# Patient Record
Sex: Female | Born: 1980 | Race: Black or African American | Hispanic: No | Marital: Married | Smoking: Never smoker
Health system: Southern US, Community
[De-identification: ages and names within clinical notes are randomized; demographics above are authoritative.]

## PROBLEM LIST (undated history)

## (undated) DIAGNOSIS — F329 Major depressive disorder, single episode, unspecified: Secondary | ICD-10-CM

## (undated) DIAGNOSIS — E7800 Pure hypercholesterolemia, unspecified: Secondary | ICD-10-CM

## (undated) DIAGNOSIS — D649 Anemia, unspecified: Secondary | ICD-10-CM

## (undated) DIAGNOSIS — D582 Other hemoglobinopathies: Secondary | ICD-10-CM

## (undated) DIAGNOSIS — G5600 Carpal tunnel syndrome, unspecified upper limb: Secondary | ICD-10-CM

## (undated) DIAGNOSIS — E119 Type 2 diabetes mellitus without complications: Secondary | ICD-10-CM

## (undated) DIAGNOSIS — F32A Depression, unspecified: Secondary | ICD-10-CM

## (undated) DIAGNOSIS — E1140 Type 2 diabetes mellitus with diabetic neuropathy, unspecified: Secondary | ICD-10-CM

## (undated) HISTORY — PX: EYE SURGERY: SHX253

## (undated) HISTORY — PX: MOUTH SURGERY: SHX715

## (undated) HISTORY — PX: TUBAL LIGATION: SHX77

## (undated) HISTORY — PX: TOE AMPUTATION: SHX809

---

## 2011-03-07 ENCOUNTER — Emergency Department (HOSPITAL_COMMUNITY): Payer: Self-pay

## 2011-03-07 ENCOUNTER — Emergency Department (HOSPITAL_COMMUNITY)
Admission: EM | Admit: 2011-03-07 | Discharge: 2011-03-08 | Disposition: A | Discharge status: Home / Self Care | Payer: Self-pay | Attending: Emergency Medicine | Admitting: Emergency Medicine

## 2011-03-07 DIAGNOSIS — Y9354 Activity, bowling: Secondary | ICD-10-CM | POA: Insufficient documentation

## 2011-03-07 DIAGNOSIS — R109 Unspecified abdominal pain: Secondary | ICD-10-CM | POA: Insufficient documentation

## 2011-03-07 DIAGNOSIS — R1013 Epigastric pain: Secondary | ICD-10-CM | POA: Insufficient documentation

## 2011-03-07 DIAGNOSIS — E119 Type 2 diabetes mellitus without complications: Secondary | ICD-10-CM | POA: Insufficient documentation

## 2011-03-07 DIAGNOSIS — Z794 Long term (current) use of insulin: Secondary | ICD-10-CM | POA: Insufficient documentation

## 2011-03-07 DIAGNOSIS — X58XXXA Exposure to other specified factors, initial encounter: Secondary | ICD-10-CM | POA: Insufficient documentation

## 2011-03-07 DIAGNOSIS — R0989 Other specified symptoms and signs involving the circulatory and respiratory systems: Secondary | ICD-10-CM | POA: Insufficient documentation

## 2011-03-07 DIAGNOSIS — R319 Hematuria, unspecified: Secondary | ICD-10-CM | POA: Insufficient documentation

## 2011-03-07 DIAGNOSIS — S335XXA Sprain of ligaments of lumbar spine, initial encounter: Secondary | ICD-10-CM | POA: Insufficient documentation

## 2011-03-07 DIAGNOSIS — R0609 Other forms of dyspnea: Secondary | ICD-10-CM | POA: Insufficient documentation

## 2011-03-07 LAB — DIFFERENTIAL
Eosinophils Relative: 4 % (ref 0–5)
Lymphocytes Relative: 40 % (ref 12–46)
Lymphs Abs: 1.8 10*3/uL (ref 0.7–4.0)

## 2011-03-07 LAB — COMPREHENSIVE METABOLIC PANEL
Albumin: 3.6 g/dL (ref 3.5–5.2)
BUN: 13 mg/dL (ref 6–23)
CO2: 23 mEq/L (ref 19–32)
Chloride: 96 mEq/L (ref 96–112)
Creatinine, Ser: 0.73 mg/dL (ref 0.50–1.10)
GFR calc Af Amer: >60 mL/min (ref >60)
GFR calc non Af Amer: >60 mL/min (ref >60)
Glucose, Bld: 344 mg/dL — ABNORMAL HIGH (ref 70–99)
Total Bilirubin: 0.4 mg/dL (ref 0.3–1.2)

## 2011-03-07 LAB — CBC
HCT: 47.8 % — ABNORMAL HIGH (ref 36.0–46.0)
MCV: 78.1 fL (ref 78.0–100.0)
RDW: 12.8 % (ref 11.5–15.5)
WBC: 4.3 10*3/uL (ref 4.0–10.5)

## 2011-03-07 LAB — GLUCOSE, CAPILLARY: Glucose-Capillary: 439 mg/dL — ABNORMAL HIGH (ref 70–99)

## 2011-03-07 LAB — LIPASE, BLOOD: Lipase: 54 U/L (ref 11–59)

## 2011-03-08 LAB — URINALYSIS, ROUTINE W REFLEX MICROSCOPIC
Bilirubin Urine: NEGATIVE
Ketones, ur: NEGATIVE mg/dL
Nitrite: NEGATIVE
Protein, ur: NEGATIVE mg/dL
Urobilinogen, UA: 1 mg/dL (ref 0.0–1.0)

## 2011-03-08 LAB — D-DIMER, QUANTITATIVE: D-Dimer, Quant: <0.22 ug/mL-FEU (ref 0.00–0.48)

## 2011-03-09 ENCOUNTER — Emergency Department (HOSPITAL_COMMUNITY)
Admission: EM | Admit: 2011-03-09 | Discharge: 2011-03-10 | Disposition: A | Discharge status: Home / Self Care | Payer: Self-pay | Attending: Emergency Medicine | Admitting: Emergency Medicine

## 2011-03-09 DIAGNOSIS — E119 Type 2 diabetes mellitus without complications: Secondary | ICD-10-CM | POA: Insufficient documentation

## 2011-03-09 DIAGNOSIS — R1013 Epigastric pain: Secondary | ICD-10-CM | POA: Insufficient documentation

## 2011-03-09 DIAGNOSIS — R110 Nausea: Secondary | ICD-10-CM | POA: Insufficient documentation

## 2011-03-09 DIAGNOSIS — R209 Unspecified disturbances of skin sensation: Secondary | ICD-10-CM | POA: Insufficient documentation

## 2011-03-09 DIAGNOSIS — Z794 Long term (current) use of insulin: Secondary | ICD-10-CM | POA: Insufficient documentation

## 2011-03-10 ENCOUNTER — Emergency Department (HOSPITAL_COMMUNITY): Payer: Self-pay

## 2012-12-24 ENCOUNTER — Encounter (HOSPITAL_COMMUNITY): Payer: Self-pay | Admitting: Emergency Medicine

## 2012-12-24 ENCOUNTER — Emergency Department (HOSPITAL_COMMUNITY)
Admission: EM | Admit: 2012-12-24 | Discharge: 2012-12-24 | Disposition: A | Discharge status: Home / Self Care | Payer: Self-pay | Attending: Emergency Medicine | Admitting: Emergency Medicine

## 2012-12-24 DIAGNOSIS — H5711 Ocular pain, right eye: Secondary | ICD-10-CM

## 2012-12-24 DIAGNOSIS — Z79899 Other long term (current) drug therapy: Secondary | ICD-10-CM | POA: Insufficient documentation

## 2012-12-24 DIAGNOSIS — Z794 Long term (current) use of insulin: Secondary | ICD-10-CM | POA: Insufficient documentation

## 2012-12-24 DIAGNOSIS — E119 Type 2 diabetes mellitus without complications: Secondary | ICD-10-CM | POA: Insufficient documentation

## 2012-12-24 DIAGNOSIS — H5710 Ocular pain, unspecified eye: Secondary | ICD-10-CM | POA: Insufficient documentation

## 2012-12-24 DIAGNOSIS — H538 Other visual disturbances: Secondary | ICD-10-CM | POA: Insufficient documentation

## 2012-12-24 HISTORY — DX: Type 2 diabetes mellitus without complications: E11.9

## 2012-12-24 MED ORDER — TETRACAINE HCL 0.5 % OP SOLN
1.0000 [drp] | Freq: Once | OPHTHALMIC | Status: AC
  Administered 2012-12-24: 1 [drp] via OPHTHALMIC
  Filled 2012-12-24: qty 2

## 2012-12-24 MED ORDER — FLUORESCEIN SODIUM 1 MG OP STRP
1.0000 | ORAL_STRIP | Freq: Once | OPHTHALMIC | Status: AC
  Administered 2012-12-24: 1 via OPHTHALMIC

## 2012-12-24 NOTE — ED Notes (Signed)
2 day hx of pain and redness in r/eye. Blurred vision r/eye only Denies drainage

## 2012-12-24 NOTE — ED Provider Notes (Signed)
History     CSN: [id number]  Arrival date & time 12/24/12  1120   First MD Initiated Contact with Patient 12/24/12 1122      Chief Complaint  Patient presents with  . Eye Pain    2 day hx of l/eye redness and pain    (Consider location/radiation/quality/duration/timing/severity/associated sxs/prior treatment) HPI Comments: Patient with diabetes with chief complaint of pain in right eye x2 days. She endorses blurred vision. She denies getting anything in her eyes. She endorses photophobia. He states that the pain is moderate to severe. Nothing makes her symptoms better or worse. She does not wear contacts or glasses.  The history is provided by the patient. No language interpreter was used.    Past Medical History  Diagnosis Date  . Diabetes mellitus without complication     Past Surgical History  Procedure Laterality Date  . Cesarean section    . Mouth surgery    . Tubal ligation      Family History  Problem Relation Age of Onset  . Diabetes Mother   . Hypertension Mother   . Hyperlipidemia Mother   . Cancer Father     History  Substance Use Topics  . Smoking status: Never Smoker   . Smokeless tobacco: Not on file  . Alcohol Use: No    OB History   Grav Para Term Preterm Abortions TAB SAB Ect Mult Living                  Review of Systems  All other systems reviewed and are negative.    Allergies  Review of patient's allergies indicates no known allergies.  Home Medications   Current Outpatient Rx  Name  Route  Sig  Dispense  Refill  . acetaminophen (TYLENOL) 500 MG tablet   Oral   Take 500 mg by mouth every 6 (six) hours as needed for pain (pain).         Marland Kitchen ibuprofen (ADVIL,MOTRIN) 800 MG tablet   Oral   Take 800 mg by mouth every 8 (eight) hours as needed for pain (pain).         . insulin NPH-regular (NOVOLIN 70/30) (70-30) 100 UNIT/ML injection   Subcutaneous   Inject 25-35 Units into the skin 2 (two) times daily. 35 units in the  morning and 25 units at bedtime         . PARoxetine (PAXIL) 10 MG tablet   Oral   Take 5 mg by mouth every morning.         . sertraline (ZOLOFT) 100 MG tablet   Oral   Take 100 mg by mouth 2 (two) times daily.           BP 110/74  Pulse 79  Temp(Src) 98.1 F (36.7 C) (Oral)  Resp 16  SpO2 99%  LMP 11/25/2012  Physical Exam  Nursing note and vitals reviewed. Constitutional: She is oriented to person, place, and time. She appears well-developed and well-nourished.  HENT:  Head: Normocephalic and atraumatic.  Eyes: EOM are normal. Pupils are equal, round, and reactive to light. Right eye exhibits no discharge. Left eye exhibits no discharge. No scleral icterus.  Right eye with significant limbal injection, lid everted, no visible foreign bodies, retinal vessels are sharp without obvious abnormality, optic disc is well visualized and is clear, no styes or hordeolum, visual acuity is R 20/200 L20/25 B20/25, eye pressure is R20 and L20.    Neck: Normal range of motion.  Cardiovascular: Normal  rate.   Pulmonary/Chest: Effort normal.  Abdominal: She exhibits no distension.  Musculoskeletal: Normal range of motion.  Neurological: She is alert and oriented to person, place, and time.  Skin: Skin is dry.  Psychiatric: She has a normal mood and affect. Her behavior is normal. Judgment and thought content normal.    ED Course  Procedures (including critical care time)  Labs Reviewed - No data to display No results found.   1. Eye pain, right       MDM  The patient with right eye pain. Eye is red. No obvious deformity, or foreign body. No fluorescein uptake on eye exam. Some strange stranding of cornea observed on slit-lamp. Discussed this patient with Dr. Manus Gunning, who also personally saw the patient. Believe the patient has iritis, however will consult ophthalmology.  I spoke with Dr. Gwen Pounds, who tells me that he can see the patient in his clinic now.  Will discharge  and send to ophthalmology now.  Patient advised to return or call if there are any problems.         Roxy Horseman, PA-C 12/24/12 [phone]

## 2012-12-24 NOTE — ED Provider Notes (Signed)
Medical screening examination/treatment/procedure(s) were conducted as a shared visit with non-physician practitioner(s) and myself.  I personally evaluated the patient during the encounter  R eye redness and pain with blurry vision x 2 days. Denies trauma. Scleral and limbal injection. IOP normal. Anterior chamber appears clear but there is a clear streak of apparent foreign body on cornea. Treat for iritis, d/w ophtho.  Glynn Octave, MD 12/24/12 [phone]

## 2013-02-15 ENCOUNTER — Encounter (HOSPITAL_COMMUNITY): Payer: Self-pay | Admitting: *Deleted

## 2013-02-15 ENCOUNTER — Emergency Department (HOSPITAL_COMMUNITY)
Admission: EM | Admit: 2013-02-15 | Discharge: 2013-02-15 | Disposition: A | Discharge status: Home / Self Care | Payer: Self-pay | Attending: Emergency Medicine | Admitting: Emergency Medicine

## 2013-02-15 ENCOUNTER — Emergency Department (HOSPITAL_COMMUNITY): Payer: Self-pay

## 2013-02-15 DIAGNOSIS — Z791 Long term (current) use of non-steroidal anti-inflammatories (NSAID): Secondary | ICD-10-CM | POA: Insufficient documentation

## 2013-02-15 DIAGNOSIS — M25539 Pain in unspecified wrist: Secondary | ICD-10-CM | POA: Insufficient documentation

## 2013-02-15 DIAGNOSIS — M25531 Pain in right wrist: Secondary | ICD-10-CM

## 2013-02-15 DIAGNOSIS — M2550 Pain in unspecified joint: Secondary | ICD-10-CM | POA: Insufficient documentation

## 2013-02-15 DIAGNOSIS — F329 Major depressive disorder, single episode, unspecified: Secondary | ICD-10-CM | POA: Insufficient documentation

## 2013-02-15 DIAGNOSIS — E119 Type 2 diabetes mellitus without complications: Secondary | ICD-10-CM | POA: Insufficient documentation

## 2013-02-15 DIAGNOSIS — Z8639 Personal history of other endocrine, nutritional and metabolic disease: Secondary | ICD-10-CM | POA: Insufficient documentation

## 2013-02-15 DIAGNOSIS — R52 Pain, unspecified: Secondary | ICD-10-CM | POA: Insufficient documentation

## 2013-02-15 DIAGNOSIS — Z79899 Other long term (current) drug therapy: Secondary | ICD-10-CM | POA: Insufficient documentation

## 2013-02-15 DIAGNOSIS — Z794 Long term (current) use of insulin: Secondary | ICD-10-CM | POA: Insufficient documentation

## 2013-02-15 DIAGNOSIS — Z862 Personal history of diseases of the blood and blood-forming organs and certain disorders involving the immune mechanism: Secondary | ICD-10-CM | POA: Insufficient documentation

## 2013-02-15 DIAGNOSIS — F3289 Other specified depressive episodes: Secondary | ICD-10-CM | POA: Insufficient documentation

## 2013-02-15 DIAGNOSIS — IMO0001 Reserved for inherently not codable concepts without codable children: Secondary | ICD-10-CM | POA: Insufficient documentation

## 2013-02-15 HISTORY — DX: Anemia, unspecified: D64.9

## 2013-02-15 HISTORY — DX: Major depressive disorder, single episode, unspecified: F32.9

## 2013-02-15 HISTORY — DX: Depression, unspecified: F32.A

## 2013-02-15 HISTORY — DX: Pure hypercholesterolemia, unspecified: E78.00

## 2013-02-15 MED ORDER — HYDROCODONE-ACETAMINOPHEN 5-325 MG PO TABS: 2.0000 | ORAL_TABLET | ORAL | Status: DC | PRN

## 2013-02-15 MED ORDER — IBUPROFEN 800 MG PO TABS: 800.0000 mg | ORAL_TABLET | Freq: Three times a day (TID) | ORAL | Status: DC

## 2013-02-15 NOTE — ED Notes (Signed)
Pt states she lost her temper earlier today and started punching the dash board w/ R hand, states having R hand pain and bruising now.

## 2013-02-15 NOTE — ED Provider Notes (Signed)
History    This chart was scribed for Roxy Horseman, PA working with Juliet Rude. Rubin Payor, MD by ED Scribe, Burman Nieves. This patient was seen in room WTR5/WTR5 and the patient's care was started at 10:25 PM.   CSN: [id number]  Arrival date & time 02/15/13  2207   First MD Initiated Contact with Patient 02/15/13 2225      Chief Complaint  Patient presents with  . Hand Pain    (Consider location/radiation/quality/duration/timing/severity/associated sxs/prior treatment) Patient is a 32 y.o. female presenting with hand pain. The history is provided by the patient. No language interpreter was used.  Hand Pain   HPI Comments: [name] is a 32 y.o. female who presents to the Emergency Department complaining of moderate constant right hand pain due to punching her dash board onset earlier today. Pt states that she lost her temper earlier and started punching the dash board resulting in hurting her right hand. There is evident bruising and she is unwilling to grip or open her hand due to the pain. Pt denies fever, chills, cough, nausea, vomiting, diarrhea, SOB, weakness, and any other associated symptoms.   Past Medical History  Diagnosis Date  . Diabetes mellitus without complication   . Depression   . High cholesterol   . Anemia     Past Surgical History  Procedure Laterality Date  . Cesarean section    . Mouth surgery    . Tubal ligation      Family History  Problem Relation Age of Onset  . Diabetes Mother   . Hypertension Mother   . Hyperlipidemia Mother   . Cancer Father     History  Substance Use Topics  . Smoking status: Never Smoker   . Smokeless tobacco: Never Used  . Alcohol Use: No    OB History   Grav Para Term Preterm Abortions TAB SAB Ect Mult Living                  Review of Systems  Musculoskeletal: Positive for myalgias and arthralgias.  All other systems reviewed and are negative.    Allergies  Review of patient's allergies indicates  no known allergies.  Home Medications   Current Outpatient Rx  Name  Route  Sig  Dispense  Refill  . acetaminophen (TYLENOL) 500 MG tablet   Oral   Take 500 mg by mouth every 6 (six) hours as needed for pain (pain).         Marland Kitchen ibuprofen (ADVIL,MOTRIN) 800 MG tablet   Oral   Take 800 mg by mouth every 8 (eight) hours as needed for pain (pain).         . insulin NPH-regular (NOVOLIN 70/30) (70-30) 100 UNIT/ML injection   Subcutaneous   Inject 25-35 Units into the skin 2 (two) times daily. 35 units in the morning and 25 units at bedtime         . naproxen (NAPROSYN) 500 MG tablet   Oral   Take 500 mg by mouth 2 (two) times daily with a meal.         . PARoxetine (PAXIL) 30 MG tablet   Oral   Take 30 mg by mouth every morning.         . predniSONE (DELTASONE) 10 MG tablet   Oral   Take 10 mg by mouth daily.           BP 108/69  Pulse 77  Temp(Src) 98.1 F (36.7 C) (Oral)  Resp 18  Ht 5' 3.5" (1.613 m)  Wt 234 lb 3 oz (106.227 kg)  BMI 40.83 kg/m2  SpO2 97%  LMP 01/25/2013  Physical Exam  Nursing note and vitals reviewed. Constitutional: She is oriented to person, place, and time. She appears well-developed and well-nourished. No distress.  HENT:  Head: Normocephalic and atraumatic.  Eyes: EOM are normal.  Neck: Neck supple. No tracheal deviation present.  Cardiovascular: Normal rate, regular rhythm, normal heart sounds and intact distal pulses.  Exam reveals no gallop and no friction rub.   No murmur heard. Brisk capillary refill.   Pulmonary/Chest: Effort normal. No respiratory distress.  Musculoskeletal: Normal range of motion.  Right hand tender to palpation diffusely right wrist tender to palpation no bony abnormality or deformity. ROM is limited secondary to pain.   Neurological: She is alert and oriented to person, place, and time.  Skin: Skin is warm and dry.  Mild bruising to the anterior right wrist.   Psychiatric: She has a normal mood and  affect. Her behavior is normal.    ED Course  Procedures (including critical care time) DIAGNOSTIC STUDIES: Oxygen Saturation is 97% on room air, adequate by my interpretation.    COORDINATION OF CARE:  10:31 PM Discussed ED treatment with pt and pt agrees.   11:15 PM No fracture noted. Labs Reviewed - No data to display Dg Forearm Right  02/15/2013   *RADIOLOGY REPORT*  Clinical Data: Punched dashboard for 30 minutes; right forearm pain.  RIGHT FOREARM - 2 VIEW  Comparison: None.  Findings: There is no evidence of fracture or dislocation.  The radius and ulna appear intact.  Mild negative ulnar variance is noted.  The elbow joint is incompletely assessed, but appears grossly unremarkable.  No elbow joint effusion is seen.  The carpal rows appear grossly intact, and demonstrate normal alignment.  No significant soft tissue abnormalities are characterized on radiograph.  IMPRESSION: No evidence of fracture or dislocation.   Original Report Authenticated By: Tonia Ghent, M.D.   Dg Wrist Complete Right  02/15/2013   *RADIOLOGY REPORT*  Clinical Data: Punched dashboard for 30 minutes; right wrist and forearm pain.  RIGHT WRIST - COMPLETE 3+ VIEW  Comparison: None.  Findings: There is no evidence of fracture or dislocation.  The carpal rows are intact, and demonstrate normal alignment.  The joint spaces are preserved.  Mild negative ulnar variance is noted.  No significant soft tissue abnormalities are seen.  IMPRESSION: No evidence of fracture or dislocation.   Original Report Authenticated By: Tonia Ghent, M.D.   Dg Hand Complete Right  02/15/2013   *RADIOLOGY REPORT*  Clinical Data: Punched dashboard for 30 minutes; right hand pain, extending to the palm.  Right wrist pain.  RIGHT HAND - COMPLETE 3+ VIEW  Comparison: None.  Findings: There is no evidence of fracture or dislocation.  The joint spaces are preserved; the soft tissues are unremarkable in appearance.  The carpal rows are intact, and  demonstrate normal alignment.  Mild negative ulnar variance is noted.  IMPRESSION: No evidence of fracture or dislocation.   Original Report Authenticated By: Tonia Ghent, M.D.     1. Wrist pain, acute, right       MDM  Patient with wrist pain, likely contusion/sprain, do to blunt trauma from repeatedly punching her dashboard. Will treat with an Ace wrap, a few pain pills, Tylenol and ibuprofen. Recommend rice therapy. Patient stable and ready for discharge.      I personally performed the services described in  this documentation, which was scribed in my presence. The recorded information has been reviewed and is accurate.     Roxy Horseman, PA-C 02/15/13 2332

## 2013-02-15 NOTE — ED Provider Notes (Signed)
Medical screening examination/treatment/procedure(s) were performed by non-physician practitioner and as supervising physician I was immediately available for consultation/collaboration.  Juliet Rude. Rubin Payor, MD 02/15/13 [phone]

## 2013-05-03 ENCOUNTER — Emergency Department (HOSPITAL_COMMUNITY)
Admission: EM | Admit: 2013-05-03 | Discharge: 2013-05-04 | Disposition: A | Discharge status: Home / Self Care | Payer: Self-pay | Attending: Emergency Medicine | Admitting: Emergency Medicine

## 2013-05-03 ENCOUNTER — Encounter (HOSPITAL_COMMUNITY): Payer: Self-pay | Admitting: *Deleted

## 2013-05-03 DIAGNOSIS — S91109A Unspecified open wound of unspecified toe(s) without damage to nail, initial encounter: Secondary | ICD-10-CM | POA: Insufficient documentation

## 2013-05-03 DIAGNOSIS — X58XXXA Exposure to other specified factors, initial encounter: Secondary | ICD-10-CM | POA: Insufficient documentation

## 2013-05-03 DIAGNOSIS — Y929 Unspecified place or not applicable: Secondary | ICD-10-CM | POA: Insufficient documentation

## 2013-05-03 DIAGNOSIS — Z791 Long term (current) use of non-steroidal anti-inflammatories (NSAID): Secondary | ICD-10-CM | POA: Insufficient documentation

## 2013-05-03 DIAGNOSIS — Z8639 Personal history of other endocrine, nutritional and metabolic disease: Secondary | ICD-10-CM | POA: Insufficient documentation

## 2013-05-03 DIAGNOSIS — Z862 Personal history of diseases of the blood and blood-forming organs and certain disorders involving the immune mechanism: Secondary | ICD-10-CM | POA: Insufficient documentation

## 2013-05-03 DIAGNOSIS — IMO0002 Reserved for concepts with insufficient information to code with codable children: Secondary | ICD-10-CM | POA: Insufficient documentation

## 2013-05-03 DIAGNOSIS — S91209A Unspecified open wound of unspecified toe(s) with damage to nail, initial encounter: Secondary | ICD-10-CM

## 2013-05-03 DIAGNOSIS — E119 Type 2 diabetes mellitus without complications: Secondary | ICD-10-CM | POA: Insufficient documentation

## 2013-05-03 DIAGNOSIS — Z794 Long term (current) use of insulin: Secondary | ICD-10-CM | POA: Insufficient documentation

## 2013-05-03 DIAGNOSIS — Y939 Activity, unspecified: Secondary | ICD-10-CM | POA: Insufficient documentation

## 2013-05-03 DIAGNOSIS — F329 Major depressive disorder, single episode, unspecified: Secondary | ICD-10-CM | POA: Insufficient documentation

## 2013-05-03 DIAGNOSIS — F3289 Other specified depressive episodes: Secondary | ICD-10-CM | POA: Insufficient documentation

## 2013-05-03 MED ORDER — AMOXICILLIN-POT CLAVULANATE 875-125 MG PO TABS: 1.0000 | ORAL_TABLET | Freq: Two times a day (BID) | ORAL | Status: DC

## 2013-05-03 NOTE — ED Notes (Signed)
The pt is a diabetic and last pm she noticed that the end of her toenail was partly off and she pulled the rest of it off.  Painful today and she is afraid that it is infected

## 2013-05-03 NOTE — ED Provider Notes (Signed)
CSN: [id number]     Arrival date & time 05/03/13  2203 History     First MD Initiated Contact with Patient 05/03/13 2339     Chief Complaint  Patient presents with  . Toe Pain   HPI  History provided by the patient. The patient is a 32 year old female with history of diabetes who presents with concerns for right toe injury and pain. Patient isn't sure of how she may have injured her right great toenail but had some damage with small amount of bleeding. She pulled off part of the toenail and since that time there is dense slight redness and swelling. She is concerned due to her diabetes of possible early infection. She denies any diffuse swelling or erythematous streaks from the toe. No fever, chills or sweats. No other aggravating or alleviating factors. No other associated symptoms.    Past Medical History  Diagnosis Date  . Diabetes mellitus without complication   . Depression   . High cholesterol   . Anemia    Past Surgical History  Procedure Laterality Date  . Cesarean section    . Mouth surgery    . Tubal ligation     Family History  Problem Relation Age of Onset  . Diabetes Mother   . Hypertension Mother   . Hyperlipidemia Mother   . Cancer Father    History  Substance Use Topics  . Smoking status: Never Smoker   . Smokeless tobacco: Never Used  . Alcohol Use: No   OB History   Grav Para Term Preterm Abortions TAB SAB Ect Mult Living                 Review of Systems  Constitutional: Negative for fever and chills.  All other systems reviewed and are negative.    Allergies  Review of patient's allergies indicates no known allergies.  Home Medications   Current Outpatient Rx  Name  Route  Sig  Dispense  Refill  . cyclobenzaprine (FLEXERIL) 10 MG tablet   Oral   Take 10 mg by mouth at bedtime as needed for muscle spasms.         Marland Kitchen ibuprofen (ADVIL,MOTRIN) 800 MG tablet   Oral   Take 800 mg by mouth 2 (two) times daily as needed for pain.         Marland Kitchen insulin aspart protamine- aspart (NOVOLOG MIX 70/30) (70-30) 100 UNIT/ML injection   Subcutaneous   Inject 25-35 Units into the skin 2 (two) times daily with a meal. Takes 35 units in the morning and 25 units in the evening         . metFORMIN (GLUCOPHAGE) 500 MG tablet   Oral   Take 500 mg by mouth 2 (two) times daily with a meal.         . naproxen (NAPROSYN) 500 MG tablet   Oral   Take 500 mg by mouth 2 (two) times daily with a meal.         . PARoxetine (PAXIL) 40 MG tablet   Oral   Take 40 mg by mouth every morning.         . predniSONE (DELTASONE) 10 MG tablet   Oral   Take 10 mg by mouth daily as needed (for tightness in chest).          . traZODone (DESYREL) 150 MG tablet   Oral   Take 450 mg by mouth at bedtime as needed for sleep.  BP 110/73  Pulse 72  Temp(Src) 98.2 F (36.8 C)  Resp 20  SpO2 99% Physical Exam  Nursing note and vitals reviewed. Constitutional: She is oriented to person, place, and time. She appears well-developed and well-nourished. No distress.  HENT:  Head: Normocephalic.  Cardiovascular: Normal rate and regular rhythm.   Pulmonary/Chest: Effort normal and breath sounds normal. No respiratory distress. She has no wheezes. She has no rales.  Musculoskeletal: Normal range of motion.  Partial right great toenail avulsion. There is some damage to the underlying nailbed with redness and mild swelling. There is no bleeding or drainage. No diffuse swelling or pain around the toe. Normal range of motion.  Neurological: She is alert and oriented to person, place, and time.  Skin: Skin is warm and dry.  Psychiatric: She has a normal mood and affect. Her behavior is normal.    ED Course   Procedures   1. Toenail avulsion, initial encounter     MDM  11:35 PM patient seen and evaluated. Patient appears well no acute distress. Patient had partial tearing of the right great toenail with slight redness and swelling. No active  bleeding or drainage.  Angus Seller, PA-C 05/03/13 2350

## 2013-05-03 NOTE — ED Notes (Signed)
Right toenail removed from nail bed. No bleeding or swelling noted. No drainage. PT states that there was some bleeding when discovered yesterday.

## 2013-05-04 NOTE — ED Provider Notes (Signed)
Medical screening examination/treatment/procedure(s) were performed by non-physician practitioner and as supervising physician I was immediately available for consultation/collaboration.  Olivia Mackie, MD 05/04/13 [phone]

## 2013-05-22 ENCOUNTER — Emergency Department (HOSPITAL_COMMUNITY)
Admission: EM | Admit: 2013-05-22 | Discharge: 2013-05-22 | Disposition: A | Discharge status: Home / Self Care | Payer: Self-pay | Attending: Emergency Medicine | Admitting: Emergency Medicine

## 2013-05-22 ENCOUNTER — Emergency Department (HOSPITAL_COMMUNITY): Payer: Self-pay

## 2013-05-22 ENCOUNTER — Encounter (HOSPITAL_COMMUNITY): Payer: Self-pay | Admitting: *Deleted

## 2013-05-22 DIAGNOSIS — E1142 Type 2 diabetes mellitus with diabetic polyneuropathy: Secondary | ICD-10-CM | POA: Insufficient documentation

## 2013-05-22 DIAGNOSIS — E1149 Type 2 diabetes mellitus with other diabetic neurological complication: Secondary | ICD-10-CM | POA: Insufficient documentation

## 2013-05-22 DIAGNOSIS — M436 Torticollis: Secondary | ICD-10-CM | POA: Insufficient documentation

## 2013-05-22 DIAGNOSIS — Z79899 Other long term (current) drug therapy: Secondary | ICD-10-CM | POA: Insufficient documentation

## 2013-05-22 DIAGNOSIS — R0602 Shortness of breath: Secondary | ICD-10-CM | POA: Insufficient documentation

## 2013-05-22 DIAGNOSIS — Z862 Personal history of diseases of the blood and blood-forming organs and certain disorders involving the immune mechanism: Secondary | ICD-10-CM | POA: Insufficient documentation

## 2013-05-22 DIAGNOSIS — F3289 Other specified depressive episodes: Secondary | ICD-10-CM | POA: Insufficient documentation

## 2013-05-22 DIAGNOSIS — F329 Major depressive disorder, single episode, unspecified: Secondary | ICD-10-CM | POA: Insufficient documentation

## 2013-05-22 DIAGNOSIS — Z791 Long term (current) use of non-steroidal anti-inflammatories (NSAID): Secondary | ICD-10-CM | POA: Insufficient documentation

## 2013-05-22 DIAGNOSIS — IMO0002 Reserved for concepts with insufficient information to code with codable children: Secondary | ICD-10-CM | POA: Insufficient documentation

## 2013-05-22 DIAGNOSIS — Z794 Long term (current) use of insulin: Secondary | ICD-10-CM | POA: Insufficient documentation

## 2013-05-22 DIAGNOSIS — M25519 Pain in unspecified shoulder: Secondary | ICD-10-CM | POA: Insufficient documentation

## 2013-05-22 HISTORY — DX: Carpal tunnel syndrome, unspecified upper limb: G56.00

## 2013-05-22 HISTORY — DX: Type 2 diabetes mellitus with diabetic neuropathy, unspecified: E11.40

## 2013-05-22 LAB — POCT I-STAT, CHEM 8
BUN: 7 mg/dL (ref 6–23)
Calcium, Ion: 1.19 mmol/L (ref 1.12–1.23)
Creatinine, Ser: 0.4 mg/dL — ABNORMAL LOW (ref 0.50–1.10)
Glucose, Bld: 369 mg/dL — ABNORMAL HIGH (ref 70–99)
TCO2: 24 mmol/L (ref 0–100)

## 2013-05-22 LAB — D-DIMER, QUANTITATIVE: D-Dimer, Quant: <0.27 ug/mL-FEU (ref 0.00–0.48)

## 2013-05-22 MED ORDER — CYCLOBENZAPRINE HCL 10 MG PO TABS: 10.0000 mg | ORAL_TABLET | Freq: Every evening | ORAL | Status: DC | PRN

## 2013-05-22 MED ORDER — NAPROXEN 500 MG PO TABS: 500.0000 mg | ORAL_TABLET | Freq: Two times a day (BID) | ORAL | Status: DC

## 2013-05-22 MED ORDER — METHOCARBAMOL 500 MG PO TABS
500.0000 mg | ORAL_TABLET | Freq: Once | ORAL | Status: AC
Start: 2013-05-22 — End: 2013-05-22
  Administered 2013-05-22: 500 mg via ORAL
  Filled 2013-05-22: qty 1

## 2013-05-22 NOTE — ED Provider Notes (Signed)
CSN: [id number]     Arrival date & time 05/22/13  1405 History   This chart was scribed for non-physician practitioner Fayrene Helper, PA-C, working with Ward Givens, MD by Dorothey Baseman, ED Scribe. This patient was seen in room TR06C/TR06C and the patient's care was started at 3:29 PM.    Chief Complaint  Patient presents with  . Shoulder Pain  . Neck Pain    Patient is a 32 y.o. female presenting with shoulder pain and neck pain. The history is provided by the patient. No language interpreter was used.  Shoulder Pain This is a new problem. The problem has been gradually worsening. Associated symptoms include shortness of breath. The symptoms are aggravated by exertion. The symptoms are relieved by medications. Treatments tried: muscle relaxers and Motrin. The treatment provided mild relief.  Neck Pain Pain location:  R side Quality:  Burning Pain radiates to:  R shoulder Pain severity:  Moderate Onset quality:  Sudden Timing:  Constant Progression:  Worsening Chronicity:  New Relieved by:  Muscle relaxants and NSAIDs Associated symptoms: no fever    HPI Comments: [name] is a 32 y.o. female who presents to the Emergency Department complaining of right shoulder pain described as burning that radiates to her right, lower neck onset 4 days ago when she states she woke up and noticed the pain. She reports associated shortness of breath onset 1 day ago that is exacerbated with movement, especially after walking only a short distance. She states this is new for her. She reports the pain is exacerbated with movement and only mildly, temporarily relieved with muscle relaxers and Motrin. She denies fever, rash, cough or hemoptysis. 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 She denies any history of embolism or recent travel. She reports no recent surgeries. Does not take exogenous hormone.  No prior hx of cancer.     Past Medical History  Diagnosis Date  . Diabetes mellitus without complication   . Depression   . High cholesterol   . Anemia   . Carpal tunnel syndrome   . Diabetic neuropathy    Past Surgical History  Procedure Laterality Date  . Cesarean section    . Mouth surgery    . Tubal ligation     Family History  Problem Relation Age of Onset  . Diabetes Mother   . Hypertension Mother   . Hyperlipidemia Mother   . Cancer Father    History  Substance Use Topics  . Smoking status: Never Smoker   . Smokeless tobacco: Never Used  . Alcohol Use: No   OB History   Grav Para Term Preterm Abortions TAB SAB Ect Mult Living                 Review of Systems  Constitutional: Negative for fever.  HENT: Positive for neck pain.   Respiratory: Positive for shortness of breath. Negative for cough.   Skin: Negative for rash.  All other systems reviewed and are negative.    Allergies  Review of patient's allergies indicates no known allergies.  Home Medications   Current Outpatient Rx  Name  Route  Sig  Dispense  Refill  . cyclobenzaprine (FLEXERIL) 10 MG tablet   Oral   Take 10 mg by mouth at bedtime as needed for muscle spasms.         Marland Kitchen ibuprofen (ADVIL,MOTRIN) 800 MG tablet   Oral  Take 800 mg by mouth 2 (two) times daily as needed for pain.         Marland Kitchen insulin aspart protamine- aspart (NOVOLOG MIX 70/30) (70-30) 100 UNIT/ML injection   Subcutaneous   Inject 25-35 Units into the skin 2 (two) times daily with a meal. Takes 35 units in the morning and 25 units in the evening         . metFORMIN (GLUCOPHAGE) 500 MG tablet   Oral   Take 500 mg by mouth 2 (two) times daily with a meal.         . naproxen (NAPROSYN) 500 MG tablet   Oral   Take 500 mg by mouth 2 (two) times daily with a meal.         . PARoxetine (PAXIL) 40 MG tablet   Oral   Take 40 mg by mouth every morning.         . predniSONE (DELTASONE) 10 MG tablet   Oral   Take 10 mg by mouth  daily as needed (for tightness in chest).          . traZODone (DESYREL) 150 MG tablet   Oral   Take 450 mg by mouth at bedtime as needed for sleep.         Marland Kitchen amoxicillin-clavulanate (AUGMENTIN) 875-125 MG per tablet   Oral   Take 1 tablet by mouth 2 (two) times daily. One po bid x 7 days   14 tablet   0    Triage Vitals: BP 122/71  Pulse 82  Temp(Src) 98.1 F (36.7 C) (Oral)  Resp 18  Ht 5\' 3"  (1.6 m)  Wt 223 lb (101.152 kg)  BMI 39.51 kg/m2  SpO2 96%  LMP 05/22/2013  Physical Exam  Nursing note and vitals reviewed. Constitutional: She is oriented to person, place, and time. She appears well-developed and well-nourished. No distress.  HENT:  Head: Normocephalic and atraumatic.  Eyes: Conjunctivae are normal.  Neck: Normal range of motion. Neck supple.  Right para cervical tenderness to palpation especially overlying the trapezius muscle. No overlaying rash. Midline spine without crepitance or step offs.   Cardiovascular: Normal rate, regular rhythm and normal heart sounds.   Pulmonary/Chest: Effort normal and breath sounds normal. No respiratory distress.  Musculoskeletal: Normal range of motion. She exhibits tenderness.  Full ROM to the right shoulder. Tenderness to palpation to the right overlaying superior surface of the shoulder.   Neurological: She is alert and oriented to person, place, and time.  Skin: Skin is warm and dry.  Psychiatric: She has a normal mood and affect. Her behavior is normal.    ED Course  Procedures (including critical care time)  Medications  methocarbamol (ROBAXIN) tablet 500 mg (500 mg Oral Given 05/22/13 1543)   DIAGNOSTIC STUDIES: Oxygen Saturation is 96% on room air, adequate by my interpretation.    COORDINATION OF CARE: 3:34PM- shoulder/neck pain likely MSK in origin.  However, pt reports DOE which is new.  Will CXR and D-dimer due to concerns of possible embolism. Discussed treatment plan with patient at bedside and patient  verbalized agreement.   5:14PM- Discussed lab results with patient. Advised patient that her D-dimer was negative, ruling out the possibility of an embolism. Advised patient that her symptoms are likely musculoskeletal in nature. Will order pain medications to manage symptoms and refer the patient to an orthopedic doctor to follow up if there are any new or worsening symptoms. Discussed treatment plan with patient at bedside and patient  verbalized agreement.    Labs Review Labs Reviewed  POCT I-STAT, CHEM 8 - Abnormal; Notable for the following:    Creatinine, Ser 0.40 (*)    Glucose, Bld 369 (*)    Hemoglobin 11.9 (*)    HCT 35.0 (*)    All other components within normal limits  D-DIMER, QUANTITATIVE   Imaging Review Dg Chest 2 View  05/22/2013   CLINICAL DATA:  Upper back pain.  EXAM: CHEST  2 VIEW  COMPARISON:  03/07/2011  FINDINGS: The heart size and mediastinal contours are within normal limits. Both lungs are clear. The visualized skeletal structures are unremarkable.  IMPRESSION: No active cardiopulmonary disease.   Electronically Signed   By: Charlett Nose   On: 05/22/2013 16:23    MDM   1. Right torticollis    BP 122/71  Pulse 82  Temp(Src) 98.1 F (36.7 C) (Oral)  Resp 18  Ht 5\' 3"  (1.6 m)  Wt 223 lb (101.152 kg)  BMI 39.51 kg/m2  SpO2 96%  LMP 05/22/2013  I have reviewed nursing notes and vital signs. I personally reviewed the imaging tests through PACS system  I reviewed available ER/hospitalization records thought the EMR  I personally performed the services described in this documentation, which was scribed in my presence. The recorded information has been reviewed and is accurate.     Fayrene Helper, PA-C 05/22/13 1718

## 2013-05-22 NOTE — ED Provider Notes (Signed)
Medical screening examination/treatment/procedure(s) were performed by non-physician practitioner and as supervising physician I was immediately available for consultation/collaboration. Devoria Albe, MD, [facility]   Ward Givens, MD 05/22/13 2300

## 2013-05-22 NOTE — ED Notes (Signed)
Pt c/o R shoulder pain that "pulls" her R neck when she lifts her R arm and increases her pain.  This began Friday.  Pt states she has hx of R shoulder dislocation, but denies any injury at this time.

## 2013-05-22 NOTE — ED Notes (Signed)
C/o right neck and shoulder pain. No known injury. Denies weakness or paresthesias.

## 2013-08-14 ENCOUNTER — Emergency Department (HOSPITAL_COMMUNITY)
Admission: EM | Admit: 2013-08-14 | Discharge: 2013-08-14 | Disposition: A | Discharge status: Home / Self Care | Payer: Self-pay | Attending: Emergency Medicine | Admitting: Emergency Medicine

## 2013-08-14 ENCOUNTER — Encounter (HOSPITAL_COMMUNITY): Payer: Self-pay | Admitting: Emergency Medicine

## 2013-08-14 DIAGNOSIS — E1149 Type 2 diabetes mellitus with other diabetic neurological complication: Secondary | ICD-10-CM | POA: Insufficient documentation

## 2013-08-14 DIAGNOSIS — F329 Major depressive disorder, single episode, unspecified: Secondary | ICD-10-CM | POA: Insufficient documentation

## 2013-08-14 DIAGNOSIS — Z794 Long term (current) use of insulin: Secondary | ICD-10-CM | POA: Insufficient documentation

## 2013-08-14 DIAGNOSIS — L309 Dermatitis, unspecified: Secondary | ICD-10-CM

## 2013-08-14 DIAGNOSIS — E1142 Type 2 diabetes mellitus with diabetic polyneuropathy: Secondary | ICD-10-CM | POA: Insufficient documentation

## 2013-08-14 DIAGNOSIS — F3289 Other specified depressive episodes: Secondary | ICD-10-CM | POA: Insufficient documentation

## 2013-08-14 DIAGNOSIS — M129 Arthropathy, unspecified: Secondary | ICD-10-CM | POA: Insufficient documentation

## 2013-08-14 DIAGNOSIS — Z79899 Other long term (current) drug therapy: Secondary | ICD-10-CM | POA: Insufficient documentation

## 2013-08-14 DIAGNOSIS — L259 Unspecified contact dermatitis, unspecified cause: Secondary | ICD-10-CM | POA: Insufficient documentation

## 2013-08-14 DIAGNOSIS — Z862 Personal history of diseases of the blood and blood-forming organs and certain disorders involving the immune mechanism: Secondary | ICD-10-CM | POA: Insufficient documentation

## 2013-08-14 MED ORDER — HYDROCORTISONE 1 % EX LOTN: 1.0000 "application " | TOPICAL_LOTION | Freq: Two times a day (BID) | CUTANEOUS | Status: DC

## 2013-08-14 NOTE — ED Notes (Signed)
PT is here with rash all over and itches.  Not inbetween fingers

## 2013-08-14 NOTE — ED Provider Notes (Signed)
CSN: [id number]     Arrival date & time 08/14/13  1213 History  This chart was scribed for non-physician practitioner Arthor Captain, PA-C, working with Dagmar Hait, MD by Dorothey Baseman, ED Scribe. This patient was seen in room TR06C/TR06C and the patient's care was started at 2:21 PM.    Chief Complaint  Patient presents with  . Rash   The history is provided by the patient. No language interpreter was used.   HPI Comments: [name] is a 32 y.o. female who presents to the Emergency Department complaining of an itching rash to the back and bilateral arms onset about a week ago that has been progressively worsening and spreading. She denies taking any medications or using any topical creams at home to treat her symptoms, but states that she has been using lotion with mild, temporary relief. Patient reports that she had a change in her medications and recently started taking magnesium. She states that she has not taken the magnesium in about 5 days. She denies any sick contacts, changes in at-home products, or staying at other residencies. She denies fever. Patient reports that she takes prednisone for arthritis in her chest. Patient has a history of DM with diabetic neuropathy.   Past Medical History  Diagnosis Date  . Diabetes mellitus without complication   . Depression   . High cholesterol   . Anemia   . Carpal tunnel syndrome   . Diabetic neuropathy    Past Surgical History  Procedure Laterality Date  . Cesarean section    . Mouth surgery    . Tubal ligation     Family History  Problem Relation Age of Onset  . Diabetes Mother   . Hypertension Mother   . Hyperlipidemia Mother   . Cancer Father    History  Substance Use Topics  . Smoking status: Never Smoker   . Smokeless tobacco: Never Used  . Alcohol Use: No   OB History   Grav Para Term Preterm Abortions TAB SAB Ect Mult Living                 Review of Systems  Constitutional: Negative for fever.   Skin: Positive for rash.    Allergies  Review of patient's allergies indicates no known allergies.  Home Medications   Current Outpatient Rx  Name  Route  Sig  Dispense  Refill  . butalbital-acetaminophen-caffeine (FIORICET WITH CODEINE) 50-325-40-30 MG per capsule   Oral   Take 1 capsule by mouth daily as needed for headache or migraine. No more than 5 days at a time         . cyclobenzaprine (FLEXERIL) 10 MG tablet   Oral   Take 1 tablet (10 mg total) by mouth at bedtime as needed for muscle spasms.   30 tablet   0   . furosemide (LASIX) 40 MG tablet   Oral   Take 40 mg by mouth daily.         Marland Kitchen ibuprofen (ADVIL,MOTRIN) 800 MG tablet   Oral   Take 800 mg by mouth 2 (two) times daily as needed for pain.         Marland Kitchen insulin aspart protamine- aspart (NOVOLOG MIX 70/30) (70-30) 100 UNIT/ML injection   Subcutaneous   Inject 25-35 Units into the skin 2 (two) times daily with a meal. Takes 35 units in the morning and 25 units in the evening         . MAGNESIUM PO   Oral  Take 1 tablet by mouth daily.         . metFORMIN (GLUCOPHAGE) 500 MG tablet   Oral   Take 500 mg by mouth 2 (two) times daily with a meal.         . naproxen (NAPROSYN) 500 MG tablet   Oral   Take 1 tablet (500 mg total) by mouth 2 (two) times daily with a meal.   30 tablet   0   . PARoxetine (PAXIL) 40 MG tablet   Oral   Take 40 mg by mouth every morning.         . predniSONE (DELTASONE) 10 MG tablet   Oral   Take 10 mg by mouth daily as needed (for tightness in chest).          . traMADol (ULTRAM) 50 MG tablet   Oral   Take 50 mg by mouth daily as needed for moderate pain.         . traZODone (DESYREL) 150 MG tablet   Oral   Take 450 mg by mouth at bedtime as needed for sleep.          Triage Vitals: BP 121/75  Pulse 99  Temp(Src) 98.4 F (36.9 C) (Oral)  Resp 18  Wt 231 lb (104.781 kg)  SpO2 97%  LMP 08/13/2013  Physical Exam  Nursing note and vitals  reviewed. Constitutional: She is oriented to person, place, and time. She appears well-developed and well-nourished. No distress.  HENT:  Head: Normocephalic and atraumatic.  Eyes: Conjunctivae are normal.  Neck: Normal range of motion. Neck supple.  Pulmonary/Chest: Effort normal. No respiratory distress.  Abdominal: She exhibits no distension.  Musculoskeletal: Normal range of motion.  Neurological: She is alert and oriented to person, place, and time.  Skin: Skin is warm and dry. Rash noted.  Psychiatric: She has a normal mood and affect. Her behavior is normal.    ED Course  Procedures (including critical care time)  DIAGNOSTIC STUDIES: Oxygen Saturation is 97% on room air, normal by my interpretation.    COORDINATION OF CARE: 2:24 PM- Discussed that the rash may have an allergic component. Advised patient to keep the skin well-hydrated with lotion and to use cortisone cream at home to manage symptoms. Advised patient to return to the ED if there are any new or worsening symptoms, especially facial swelling or other signs of allergic reaction. Discussed treatment plan with patient at bedside and patient verbalized agreement.     Labs Review Labs Reviewed - No data to display Imaging Review No results found.  EKG Interpretation   None       MDM   1. Dermatitis     Patient with history of eczema and allergies.  I've advised her to use only diaphoresis and free the symptoms have been detergent.  Wash clothing and remains in very hot water.  Patient will be given hydrocortisone lotion 1% not to be used on face or neck.  She is currently taking prednisone.  Advised her to discontinue using the magnesium is may have a component to it she is allergic.  Return precautions discussed.I personally performed the services described in this documentation, which was scribed in my presence. The recorded information has been reviewed and is accurate.     Arthor Captain,  PA-C 08/14/13 [phone]

## 2013-08-15 NOTE — ED Provider Notes (Signed)
Medical screening examination/treatment/procedure(s) were performed by non-physician practitioner and as supervising physician I was immediately available for consultation/collaboration.  EKG Interpretation   None         William Maycen Degregory, MD 08/15/13 0704 

## 2013-10-20 ENCOUNTER — Encounter (HOSPITAL_COMMUNITY): Payer: Self-pay | Admitting: Emergency Medicine

## 2013-10-20 ENCOUNTER — Emergency Department (HOSPITAL_COMMUNITY): Payer: Self-pay

## 2013-10-20 ENCOUNTER — Emergency Department (HOSPITAL_COMMUNITY)
Admission: EM | Admit: 2013-10-20 | Discharge: 2013-10-20 | Disposition: A | Discharge status: Home / Self Care | Payer: Self-pay | Attending: Emergency Medicine | Admitting: Emergency Medicine

## 2013-10-20 DIAGNOSIS — F3289 Other specified depressive episodes: Secondary | ICD-10-CM | POA: Insufficient documentation

## 2013-10-20 DIAGNOSIS — Z862 Personal history of diseases of the blood and blood-forming organs and certain disorders involving the immune mechanism: Secondary | ICD-10-CM | POA: Insufficient documentation

## 2013-10-20 DIAGNOSIS — E119 Type 2 diabetes mellitus without complications: Secondary | ICD-10-CM | POA: Insufficient documentation

## 2013-10-20 DIAGNOSIS — E1149 Type 2 diabetes mellitus with other diabetic neurological complication: Secondary | ICD-10-CM | POA: Insufficient documentation

## 2013-10-20 DIAGNOSIS — R269 Unspecified abnormalities of gait and mobility: Secondary | ICD-10-CM | POA: Insufficient documentation

## 2013-10-20 DIAGNOSIS — M25562 Pain in left knee: Secondary | ICD-10-CM

## 2013-10-20 DIAGNOSIS — E1142 Type 2 diabetes mellitus with diabetic polyneuropathy: Secondary | ICD-10-CM | POA: Insufficient documentation

## 2013-10-20 DIAGNOSIS — Z79899 Other long term (current) drug therapy: Secondary | ICD-10-CM | POA: Insufficient documentation

## 2013-10-20 DIAGNOSIS — Z794 Long term (current) use of insulin: Secondary | ICD-10-CM | POA: Insufficient documentation

## 2013-10-20 DIAGNOSIS — M25469 Effusion, unspecified knee: Secondary | ICD-10-CM | POA: Insufficient documentation

## 2013-10-20 DIAGNOSIS — M25569 Pain in unspecified knee: Secondary | ICD-10-CM | POA: Insufficient documentation

## 2013-10-20 DIAGNOSIS — IMO0002 Reserved for concepts with insufficient information to code with codable children: Secondary | ICD-10-CM | POA: Insufficient documentation

## 2013-10-20 DIAGNOSIS — F329 Major depressive disorder, single episode, unspecified: Secondary | ICD-10-CM | POA: Insufficient documentation

## 2013-10-20 LAB — SEDIMENTATION RATE: SED RATE: 18 mm/h (ref 0–22)

## 2013-10-20 LAB — C-REACTIVE PROTEIN: CRP: 0.6 mg/dL — ABNORMAL HIGH (ref <0.60)

## 2013-10-20 MED ORDER — LIDOCAINE HCL (PF) 1 % IJ SOLN
20.0000 mL | Freq: Once | INTRAMUSCULAR | Status: DC
  Filled 2013-10-20: qty 20

## 2013-10-20 MED ORDER — HYDROCODONE-ACETAMINOPHEN 5-325 MG PO TABS
1.0000 | ORAL_TABLET | Freq: Once | ORAL | Status: AC
  Administered 2013-10-20: 1 via ORAL
  Filled 2013-10-20: qty 1

## 2013-10-20 NOTE — ED Notes (Signed)
Patient transported to X-ray 

## 2013-10-20 NOTE — ED Notes (Signed)
Parker, PA at bedside.  

## 2013-10-20 NOTE — ED Notes (Signed)
Pt reports left knee pain from old injury which began hurting again yesterday. Slight swelling noted to left knee.

## 2013-10-20 NOTE — ED Provider Notes (Signed)
CSN: [id number]     Arrival date & time 10/20/13  0654 History   First MD Initiated Contact with Patient 10/20/13 [id number]     Chief Complaint  Patient presents with  . Knee Pain   (Consider location/radiation/quality/duration/timing/severity/associated sxs/prior Treatment) HPI Comments: [name] is a 33 year-old female with a past medical history of DM, Diabetic neurpathy, presenting the Emergency Department with a chief complaint of left knee pain since yesterday morning.  The patient reports she was awakened by the knee pain.  She reports swelling to the area.  She reports a remote history of trauma in 64(, she reports she landed on her knee.  She denies recent trauma to the area.  Denies fever or rash.   The history is provided by the patient and medical records. No language interpreter was used.    Past Medical History  Diagnosis Date  . Diabetes mellitus without complication   . Depression   . High cholesterol   . Anemia   . Carpal tunnel syndrome   . Diabetic neuropathy    Past Surgical History  Procedure Laterality Date  . Cesarean section    . Mouth surgery    . Tubal ligation     Family History  Problem Relation Age of Onset  . Diabetes Mother   . Hypertension Mother   . Hyperlipidemia Mother   . Cancer Father    History  Substance Use Topics  . Smoking status: Never Smoker   . Smokeless tobacco: Never Used  . Alcohol Use: No   OB History   Grav Para Term Preterm Abortions TAB SAB Ect Mult Living                 Review of Systems  Constitutional: Negative for fever and chills.  Musculoskeletal: Positive for arthralgias, gait problem and joint swelling. Negative for back pain.  Skin: Negative for color change and rash.    Allergies  Review of patient's allergies indicates no known allergies.  Home Medications   Current Outpatient Rx  Name  Route  Sig  Dispense  Refill  . butalbital-acetaminophen-caffeine (FIORICET WITH CODEINE) 50-325-40-30 MG  per capsule   Oral   Take 1 capsule by mouth daily as needed for headache or migraine. No more than 5 days at a time         . cyclobenzaprine (FLEXERIL) 10 MG tablet   Oral   Take 1 tablet (10 mg total) by mouth at bedtime as needed for muscle spasms.   30 tablet   0   . furosemide (LASIX) 40 MG tablet   Oral   Take 40 mg by mouth daily.         . hydrocortisone 1 % lotion   Topical   Apply 1 application topically 2 (two) times daily.   118 mL   0   . ibuprofen (ADVIL,MOTRIN) 800 MG tablet   Oral   Take 800 mg by mouth 2 (two) times daily as needed for pain.         Marland Kitchen insulin aspart protamine- aspart (NOVOLOG MIX 70/30) (70-30) 100 UNIT/ML injection   Subcutaneous   Inject 25-35 Units into the skin 2 (two) times daily with a meal. Takes 35 units in the morning and 25 units in the evening         . MAGNESIUM PO   Oral   Take 1 tablet by mouth daily.         . metFORMIN (GLUCOPHAGE) 500 MG tablet  Oral   Take 500 mg by mouth 2 (two) times daily with a meal.         . naproxen (NAPROSYN) 500 MG tablet   Oral   Take 1 tablet (500 mg total) by mouth 2 (two) times daily with a meal.   30 tablet   0   . PARoxetine (PAXIL) 40 MG tablet   Oral   Take 40 mg by mouth every morning.         . predniSONE (DELTASONE) 10 MG tablet   Oral   Take 10 mg by mouth daily as needed (for tightness in chest).          . traMADol (ULTRAM) 50 MG tablet   Oral   Take 50 mg by mouth daily as needed for moderate pain.         . traZODone (DESYREL) 150 MG tablet   Oral   Take 450 mg by mouth at bedtime as needed for sleep.          BP 105/62  Pulse 68  Temp(Src) 97.1 F (36.2 C)  Resp 18  Ht 5\' 3"  (1.6 m)  Wt 221 lb (100.245 kg)  BMI 39.16 kg/m2  SpO2 96%  LMP 10/11/2013 Physical Exam  Nursing note and vitals reviewed. Constitutional: She is oriented to person, place, and time. She appears well-developed and well-nourished. No distress.  HENT:  Head:  Normocephalic and atraumatic.  Neck: Neck supple.  Pulmonary/Chest: Effort normal.  Abdominal: Soft.  Musculoskeletal:       Left knee: She exhibits normal range of motion, no erythema, normal alignment, no LCL laxity and normal patellar mobility. Tenderness found. Medial joint line tenderness noted.       Legs: Small amount of swelling to the left knee,  No increase in temperature to touch, no overlying erythema.   Neurological: She is alert and oriented to person, place, and time.  Skin: Skin is warm and dry. No rash noted. She is not diaphoretic.  Psychiatric: She has a normal mood and affect. Her behavior is normal. Thought content normal.    ED Course  Procedures (including critical care time) Labs Review Labs Reviewed - No data to display Imaging Review      DG Knee Complete 4 Views Left (Final result)  Result time: 10/20/13 13:43:13    Final result by Rad Results In Interface (10/20/13 13:43:13)    Narrative:   CLINICAL DATA: 33 year old female with knee pain.  EXAM: FOUR VIEW LEFT KNEE  TECHNIQUE: Four views of the left knee.  CONTRAST: None  COMPARISON: None.  RADIOPHARMACEUTICALS: None  FLUOROSCOPY TIME: None  FINDINGS: Large body habitus. No knee joint effusion identified. Bone mineralization within normal limits. Joint spaces preserved. Patella intact. No acute fracture or dislocation.  IMPRESSION: No acute fracture or dislocation about the left knee.   Electronically Signed By: Augusto GambleLee Hall M.D. On: 10/20/2013 09:24     EKG Interpretation   None       MDM   1. Left knee pain    Pt without a recent history of trauma presents with left knee pain and minimal swelling.  PE shows increase tenderness over medial joint line, likely meniscal injury. XR to evaluate joint. XR system is down and Dr. Rhunette CroftNanavati evaluated the patient as well as independently viewed the XR in the viewing room and reports no evidence of effusion.  CRP and Sed rate ordered  prior to this. Discussed lab results, imaging results, and treatment plan with the  patient. Return precautions given. Reports understanding and no other concerns at this time.  Patient is stable for discharge at this time.     Clabe Seal, PA-C 10/25/13 [phone]

## 2013-10-20 NOTE — ED Notes (Signed)
Ortho tec at bedside 

## 2013-10-20 NOTE — ED Notes (Signed)
Patient returned from X-ray 

## 2013-10-20 NOTE — ED Notes (Signed)
[employer] contacted and on the way

## 2013-10-20 NOTE — Progress Notes (Signed)
Orthopedic Tech Progress Note Patient Details:  Nicole GrillsJessica Dunlap 09/11/1981 [id number]  Ortho Devices Type of Ortho Device: Crutches;Knee Sleeve Ortho Device/Splint Interventions: Application   Cammer, Mickie BailJennifer Carol 10/20/2013, 12:00 PM

## 2013-10-20 NOTE — Discharge Instructions (Signed)
Call for a follow up appointment with a Family or Primary Care Provider.  Return if Symptoms worsen.   Take medication as prescribed.  Use your crutches to help take some of the weight off of your knee.

## 2013-10-27 NOTE — ED Provider Notes (Signed)
Medical screening examination/treatment/procedure(s) were conducted as a shared visit with non-physician practitioner(s) and myself.  I personally evaluated the patient during the encounter.  EKG Interpretation   None      Pt with knee pain. She is diabetic. Exam shows no effusion, no callor. ROM is intact, she is able to bear full weight. Pt's xray shows no effusion.  Derwood KaplanAnkit Klein Willcox, MD 10/27/13 1136

## 2013-11-12 ENCOUNTER — Emergency Department (HOSPITAL_COMMUNITY)
Admission: EM | Admit: 2013-11-12 | Discharge: 2013-11-12 | Disposition: A | Discharge status: Home / Self Care | Payer: Self-pay | Attending: Emergency Medicine | Admitting: Emergency Medicine

## 2013-11-12 ENCOUNTER — Encounter (HOSPITAL_COMMUNITY): Payer: Self-pay | Admitting: Emergency Medicine

## 2013-11-12 DIAGNOSIS — Z794 Long term (current) use of insulin: Secondary | ICD-10-CM | POA: Insufficient documentation

## 2013-11-12 DIAGNOSIS — E1149 Type 2 diabetes mellitus with other diabetic neurological complication: Secondary | ICD-10-CM | POA: Insufficient documentation

## 2013-11-12 DIAGNOSIS — R252 Cramp and spasm: Secondary | ICD-10-CM | POA: Insufficient documentation

## 2013-11-12 DIAGNOSIS — R209 Unspecified disturbances of skin sensation: Secondary | ICD-10-CM | POA: Insufficient documentation

## 2013-11-12 DIAGNOSIS — Z79899 Other long term (current) drug therapy: Secondary | ICD-10-CM | POA: Insufficient documentation

## 2013-11-12 DIAGNOSIS — F329 Major depressive disorder, single episode, unspecified: Secondary | ICD-10-CM | POA: Insufficient documentation

## 2013-11-12 DIAGNOSIS — R739 Hyperglycemia, unspecified: Secondary | ICD-10-CM

## 2013-11-12 DIAGNOSIS — F3289 Other specified depressive episodes: Secondary | ICD-10-CM | POA: Insufficient documentation

## 2013-11-12 DIAGNOSIS — E1142 Type 2 diabetes mellitus with diabetic polyneuropathy: Secondary | ICD-10-CM | POA: Insufficient documentation

## 2013-11-12 DIAGNOSIS — D649 Anemia, unspecified: Secondary | ICD-10-CM | POA: Insufficient documentation

## 2013-11-12 DIAGNOSIS — Z791 Long term (current) use of non-steroidal anti-inflammatories (NSAID): Secondary | ICD-10-CM | POA: Insufficient documentation

## 2013-11-12 LAB — BLOOD GAS, VENOUS
Acid-Base Excess: 3.2 mmol/L — ABNORMAL HIGH (ref 0.0–2.0)
BICARBONATE: 29.4 meq/L — AB (ref 20.0–24.0)
O2 SAT: 41.4 %
PO2 VEN: 0 mmHg — AB (ref 30.0–45.0)
Patient temperature: 98.6
TCO2: 26.9 mmol/L (ref 0–100)
pCO2, Ven: 53.8 mmHg — ABNORMAL HIGH (ref 45.0–50.0)
pH, Ven: 7.356 — ABNORMAL HIGH (ref 7.250–7.300)

## 2013-11-12 LAB — COMPREHENSIVE METABOLIC PANEL
ALT: 11 U/L (ref 0–35)
AST: 13 U/L (ref 0–37)
Albumin: 3.2 g/dL — ABNORMAL LOW (ref 3.5–5.2)
Alkaline Phosphatase: 75 U/L (ref 39–117)
BUN: 10 mg/dL (ref 6–23)
CO2: 26 mEq/L (ref 19–32)
Calcium: 9.1 mg/dL (ref 8.4–10.5)
Chloride: 98 mEq/L (ref 96–112)
Creatinine, Ser: 0.44 mg/dL — ABNORMAL LOW (ref 0.50–1.10)
GFR calc Af Amer: >90 mL/min (ref >90)
GFR calc non Af Amer: >90 mL/min (ref >90)
Glucose, Bld: 329 mg/dL — ABNORMAL HIGH (ref 70–99)
Potassium: 4.2 mEq/L (ref 3.7–5.3)
Sodium: 136 mEq/L — ABNORMAL LOW (ref 137–147)
Total Bilirubin: 0.3 mg/dL (ref 0.3–1.2)
Total Protein: 7 g/dL (ref 6.0–8.3)

## 2013-11-12 LAB — CBC WITH DIFFERENTIAL/PLATELET
Basophils Absolute: 0 10*3/uL (ref 0.0–0.1)
Basophils Relative: 1 % (ref 0–1)
Eosinophils Absolute: 0.1 10*3/uL (ref 0.0–0.7)
Eosinophils Relative: 2 % (ref 0–5)
HCT: 35.7 % — ABNORMAL LOW (ref 36.0–46.0)
Hemoglobin: 12.8 g/dL (ref 12.0–15.0)
Lymphocytes Relative: 36 % (ref 12–46)
Lymphs Abs: 1.9 10*3/uL (ref 0.7–4.0)
MCH: 28.9 pg (ref 26.0–34.0)
MCHC: 35.9 g/dL (ref 30.0–36.0)
MCV: 80.6 fL (ref 78.0–100.0)
Monocytes Absolute: 0.4 10*3/uL (ref 0.1–1.0)
Monocytes Relative: 8 % (ref 3–12)
Neutro Abs: 2.9 10*3/uL (ref 1.7–7.7)
Neutrophils Relative %: 53 % (ref 43–77)
Platelets: 239 10*3/uL (ref 150–400)
RBC: 4.43 MIL/uL (ref 3.87–5.11)
RDW: 13.3 % (ref 11.5–15.5)
WBC: 5.4 10*3/uL (ref 4.0–10.5)

## 2013-11-12 LAB — CBG MONITORING, ED
GLUCOSE-CAPILLARY: 368 mg/dL — AB (ref 70–99)
GLUCOSE-CAPILLARY: 234 mg/dL — AB (ref 70–99)

## 2013-11-12 MED ORDER — SODIUM CHLORIDE 0.9 % IV BOLUS (SEPSIS)
1000.0000 mL | Freq: Once | INTRAVENOUS | Status: AC
  Administered 2013-11-12: 1000 mL via INTRAVENOUS

## 2013-11-12 NOTE — ED Provider Notes (Signed)
CSN: [id number]     Arrival date & time 11/12/13  1200 History   First MD Initiated Contact with Patient 11/12/13 1331     Chief Complaint  Patient presents with  . Hyperglycemia  . Leg Pain     (Consider location/radiation/quality/duration/timing/severity/associated sxs/prior Treatment) Patient is a 33 y.o. female presenting with leg pain. The history is provided by the patient. No language interpreter was used.  Leg Pain Location:  Leg Time since incident: none. Injury: no   Leg location:  R leg and L leg Pain details:    Quality:  Cramping   Radiates to:  Does not radiate   Severity:  Moderate   Onset quality:  Unable to specify   Duration:  6 months   Timing:  Intermittent   Progression:  Worsening Chronicity:  Chronic Dislocation: no   Foreign body present:  No foreign bodies Tetanus status:  Unknown Prior injury to area:  No Relieved by:  Nothing Worsened by:  Nothing tried Ineffective treatments: flexeril, ibuprofen, naprosyn. Associated symptoms: numbness (chronic unchanged numbness in feet)   Associated symptoms: no back pain, no decreased ROM, no fatigue, no fever, no itching, no muscle weakness, no neck pain, no stiffness and no swelling   Risk factors: obesity   Risk factors: no frequent fractures, no known bone disorder and no recent illness     Past Medical History  Diagnosis Date  . Diabetes mellitus without complication   . Depression   . High cholesterol   . Anemia   . Carpal tunnel syndrome   . Diabetic neuropathy    Past Surgical History  Procedure Laterality Date  . Cesarean section    . Mouth surgery    . Tubal ligation     Family History  Problem Relation Age of Onset  . Diabetes Mother   . Hypertension Mother   . Hyperlipidemia Mother   . Cancer Father    History  Substance Use Topics  . Smoking status: Never Smoker   . Smokeless tobacco: Never Used  . Alcohol Use: No   OB History   Grav Para Term Preterm Abortions TAB SAB Ect  Mult Living                 Review of Systems  Constitutional: Negative for fever, chills, diaphoresis, activity change, appetite change and fatigue.  HENT: Negative for congestion, facial swelling, rhinorrhea and sore throat.   Eyes: Negative for photophobia and discharge.  Respiratory: Negative for cough, chest tightness and shortness of breath.   Cardiovascular: Negative for chest pain, palpitations and leg swelling.  Gastrointestinal: Negative for nausea, vomiting, abdominal pain and diarrhea.  Endocrine: Negative for polydipsia and polyuria.  Genitourinary: Negative for dysuria, frequency, difficulty urinating and pelvic pain.  Musculoskeletal: Negative for arthralgias, back pain, neck pain, neck stiffness and stiffness.  Skin: Negative for color change, itching and wound.  Allergic/Immunologic: Negative for immunocompromised state.  Neurological: Negative for facial asymmetry, weakness, numbness and headaches.  Hematological: Does not bruise/bleed easily.  Psychiatric/Behavioral: Negative for confusion and agitation.      Allergies  Review of patient's allergies indicates no known allergies.  Home Medications   Current Outpatient Rx  Name  Route  Sig  Dispense  Refill  . butalbital-acetaminophen-caffeine (FIORICET WITH CODEINE) 50-325-40-30 MG per capsule   Oral   Take 1 capsule by mouth daily as needed for headache or migraine. No more than 5 days at a time         .  cyclobenzaprine (FLEXERIL) 10 MG tablet   Oral   Take 1 tablet (10 mg total) by mouth at bedtime as needed for muscle spasms.   30 tablet   0   . furosemide (LASIX) 40 MG tablet   Oral   Take 40 mg by mouth daily.         Marland Kitchen. ibuprofen (ADVIL,MOTRIN) 800 MG tablet   Oral   Take 800 mg by mouth 2 (two) times daily as needed for pain.         Marland Kitchen. insulin aspart protamine- aspart (NOVOLOG MIX 70/30) (70-30) 100 UNIT/ML injection   Subcutaneous   Inject 25-35 Units into the skin 2 (two) times daily  with a meal. Takes 35 units in the morning and 25 units in the evening         . MAGNESIUM PO   Oral   Take 1 tablet by mouth 2 (two) times daily.          . metFORMIN (GLUCOPHAGE) 500 MG tablet   Oral   Take 500 mg by mouth 2 (two) times daily with a meal.         . naproxen (NAPROSYN) 500 MG tablet   Oral   Take 1 tablet (500 mg total) by mouth 2 (two) times daily with a meal.   30 tablet   0   . PARoxetine (PAXIL) 40 MG tablet   Oral   Take 40 mg by mouth every morning.         . predniSONE (DELTASONE) 10 MG tablet   Oral   Take 10 mg by mouth daily as needed (for tightness in chest).          . traMADol (ULTRAM) 50 MG tablet   Oral   Take 50 mg by mouth daily as needed for moderate pain.         . traZODone (DESYREL) 150 MG tablet   Oral   Take 450 mg by mouth at bedtime as needed for sleep.         . vitamin B-12 (CYANOCOBALAMIN) 50 MCG tablet   Oral   Take 50 mcg by mouth 2 (two) times daily.          BP 102/56  Pulse 74  Temp(Src) 98.1 F (36.7 C) (Oral)  Resp 18  SpO2 98%  LMP 10/04/2013 Physical Exam  Constitutional: She is oriented to person, place, and time. She appears well-developed and well-nourished. No distress.  HENT:  Head: Normocephalic and atraumatic.  Mouth/Throat: No oropharyngeal exudate.  Eyes: Pupils are equal, round, and reactive to light.  Neck: Normal range of motion. Neck supple.  Cardiovascular: Normal rate, regular rhythm and normal heart sounds.  Exam reveals no gallop and no friction rub.   No murmur heard. Pulmonary/Chest: Effort normal and breath sounds normal. No respiratory distress. She has no wheezes. She has no rales.  Abdominal: Soft. Bowel sounds are normal. She exhibits no distension and no mass. There is no tenderness. There is no rebound and no guarding.  Musculoskeletal: Normal range of motion. She exhibits no edema.       Left upper leg: She exhibits tenderness (generalized tenderness of  musculature).  Neurological: She is alert and oriented to person, place, and time.  Skin: Skin is warm and dry.  Psychiatric: She has a normal mood and affect.    ED Course  Procedures (including critical care time) Labs Review Labs Reviewed  CBC WITH DIFFERENTIAL - Abnormal; Notable for the following:  HCT 35.7 (*)    All other components within normal limits  COMPREHENSIVE METABOLIC PANEL - Abnormal; Notable for the following:    Sodium 136 (*)    Glucose, Bld 329 (*)    Creatinine, Ser 0.44 (*)    Albumin 3.2 (*)    All other components within normal limits  BLOOD GAS, VENOUS - Abnormal; Notable for the following:    pH, Ven 7.356 (*)    pCO2, Ven 53.8 (*)    pO2, Ven 00.0 (*)    Bicarbonate 29.4 (*)    Acid-Base Excess 3.2 (*)    All other components within normal limits  CBG MONITORING, ED - Abnormal; Notable for the following:    Glucose-Capillary 368 (*)    All other components within normal limits  CBG MONITORING, ED - Abnormal; Notable for the following:    Glucose-Capillary 234 (*)    All other components within normal limits   Imaging Review No results found.  EKG Interpretation   None       MDM   Final diagnoses:  Hyperglycemia  Leg cramps    Pt is a 33 y.o. female with Pmhx as above who presents with several months of worsening leg cramping as well as several months of hyperglycemia.  Denies weakness, fevers, abdominal pain. She has been compliant w/ meds, has cut back on sodas and cooking more at home. On PE, VSS, in NAD.  Cardiopulm & abdominal exam benign. She has generalized TTP of soft tissues of BLLE w/ equal strength, no LE edema. She has dec sensation in feet at baseline which is unchanged.   CMP, VBG not c/w DKA or HONK.   Glu improved w/ 1L NS.  No significant e-lyte disturbances to explain leg cramping.  I feel pt is safe to f/u closely with PCP for further mgmt of these complaints. Have stressed importance of following diabetic diet. Return  precautions given for new or worsening symptoms.         Shanna Cisco, MD 11/13/13 [phone]

## 2013-11-12 NOTE — ED Notes (Signed)
Pt states cbg elevated x 3-4 years.  MD trying to get meds to work with multiple adjustments.  Pt has never been hospitalized for same.  Also, pt was on crutches 2 weeks ago for leg pain..  Had rec'd cortisone shot which did not work.  Pt states now pain in both legs.

## 2013-11-12 NOTE — ED Notes (Signed)
Pt states that CBG is normally 300s-400s at home.

## 2013-11-12 NOTE — Discharge Instructions (Signed)
Hyperglycemia °Hyperglycemia occurs when the glucose (sugar) in your blood is too high. Hyperglycemia can happen for many reasons, but it most often happens to people who do not know they have diabetes or are not managing their diabetes properly.  °CAUSES  °Whether you have diabetes or not, there are other causes of hyperglycemia. Hyperglycemia can occur when you have diabetes, but it can also occur in other situations that you might not be as aware of, such as: °Diabetes °· If you have diabetes and are having problems controlling your blood glucose, hyperglycemia could occur because of some of the following reasons: °· Not following your meal plan. °· Not taking your diabetes medications or not taking it properly. °· Exercising less or doing less activity than you normally do. °· Being sick. °Pre-diabetes °· This cannot be ignored. Before people develop Type 2 diabetes, they almost always have "pre-diabetes." This is when your blood glucose levels are higher than normal, but not yet high enough to be diagnosed as diabetes. Research has shown that some long-term damage to the body, especially the heart and circulatory system, may already be occurring during pre-diabetes. If you take action to manage your blood glucose when you have pre-diabetes, you may delay or prevent Type 2 diabetes from developing. °Stress °· If you have diabetes, you may be "diet" controlled or on oral medications or insulin to control your diabetes. However, you may find that your blood glucose is higher than usual in the hospital whether you have diabetes or not. This is often referred to as "stress hyperglycemia." Stress can elevate your blood glucose. This happens because of hormones put out by the body during times of stress. If stress has been the cause of your high blood glucose, it can be followed regularly by your caregiver. That way he/she can make sure your hyperglycemia does not continue to get worse or progress to  diabetes. °Steroids °· Steroids are medications that act on the infection fighting system (immune system) to block inflammation or infection. One side effect can be a rise in blood glucose. Most people can produce enough extra insulin to allow for this rise, but for those who cannot, steroids make blood glucose levels go even higher. It is not unusual for steroid treatments to "uncover" diabetes that is developing. It is not always possible to determine if the hyperglycemia will go away after the steroids are stopped. A special blood test called an A1c is sometimes done to determine if your blood glucose was elevated before the steroids were started. °SYMPTOMS °· Thirsty. °· Frequent urination. °· Dry mouth. °· Blurred vision. °· Tired or fatigue. °· Weakness. °· Sleepy. °· Tingling in feet or leg. °DIAGNOSIS  °Diagnosis is made by monitoring blood glucose in one or all of the following ways: °· A1c test. This is a chemical found in your blood. °· Fingerstick blood glucose monitoring. °· Laboratory results. °TREATMENT  °First, knowing the cause of the hyperglycemia is important before the hyperglycemia can be treated. Treatment may include, but is not be limited to: °· Education. °· Change or adjustment in medications. °· Change or adjustment in meal plan. °· Treatment for an illness, infection, etc. °· More frequent blood glucose monitoring. °· Change in exercise plan. °· Decreasing or stopping steroids. °· Lifestyle changes. °HOME CARE INSTRUCTIONS  °· Test your blood glucose as directed. °· Exercise regularly. Your caregiver will give you instructions about exercise. Pre-diabetes or diabetes which comes on with stress is helped by exercising. °· Eat wholesome,   balanced meals. Eat often and at regular, fixed times. Your caregiver or nutritionist will give you a meal plan to guide your sugar intake.  Being at an ideal weight is important. If needed, losing as little as 10 to 15 pounds may help improve blood  glucose levels. SEEK MEDICAL CARE IF:   You have questions about medicine, activity, or diet.  You continue to have symptoms (problems such as increased thirst, urination, or weight gain). SEEK IMMEDIATE MEDICAL CARE IF:   You are vomiting or have diarrhea.  Your breath smells fruity.  You are breathing faster or slower.  You are very sleepy or incoherent.  You have numbness, tingling, or pain in your feet or hands.  You have chest pain.  Your symptoms get worse even though you have been following your caregiver's orders.  If you have any other questions or concerns. Document Released: 03/03/2001 Document Revised: 11/30/2011 Document Reviewed: 01/04/2012 [facility] Patient Information 2014 [place], [place]. Leg Cramps Leg cramps that occur during exercise can be caused by poor circulation or dehydration. However, muscle cramps that occur at rest or during the night are usually not due to any serious medical problem. Heat cramps may cause muscle spasms during hot weather.  CAUSES There is no clear cause for muscle cramps. However, dehydration may be a factor for those who do not drink enough fluids and those who exercise in the heat. Imbalances in the level of sodium, potassium, calcium or magnesium in the muscle tissue may also be a factor. Some medications, such as water pills (diuretics), may cause loss of chemicals that the body needs (like sodium and potassium) and cause muscle cramps. TREATMENT   Make sure your diet has enough fluids and essential minerals for the muscle to work normally.  Avoid strenuous exercise for several days if you have been having frequent leg cramps.  Stretch and massage the cramped muscle for several minutes.  Some medicines may be helpful in some patients with night cramps. Only take over-the-counter or prescription medicines as directed by your caregiver. SEEK IMMEDIATE MEDICAL CARE IF:   Your leg cramps become worse.  Your foot becomes cold,  numb, or blue. Document Released: 10/15/2004 Document Revised: 11/30/2011 Document Reviewed: 10/02/2008 [facility] Patient Information 2014 [place], [place].

## 2014-06-04 ENCOUNTER — Encounter (HOSPITAL_COMMUNITY): Payer: Self-pay | Admitting: Emergency Medicine

## 2014-06-04 ENCOUNTER — Emergency Department (HOSPITAL_COMMUNITY)
Admission: EM | Admit: 2014-06-04 | Discharge: 2014-06-04 | Disposition: A | Discharge status: Home / Self Care | Payer: Self-pay | Attending: Emergency Medicine | Admitting: Emergency Medicine

## 2014-06-04 ENCOUNTER — Emergency Department (HOSPITAL_COMMUNITY): Payer: Self-pay

## 2014-06-04 DIAGNOSIS — E119 Type 2 diabetes mellitus without complications: Secondary | ICD-10-CM | POA: Insufficient documentation

## 2014-06-04 DIAGNOSIS — M79672 Pain in left foot: Secondary | ICD-10-CM

## 2014-06-04 DIAGNOSIS — Z8669 Personal history of other diseases of the nervous system and sense organs: Secondary | ICD-10-CM | POA: Insufficient documentation

## 2014-06-04 DIAGNOSIS — Z79899 Other long term (current) drug therapy: Secondary | ICD-10-CM | POA: Insufficient documentation

## 2014-06-04 DIAGNOSIS — Z862 Personal history of diseases of the blood and blood-forming organs and certain disorders involving the immune mechanism: Secondary | ICD-10-CM | POA: Insufficient documentation

## 2014-06-04 DIAGNOSIS — M79609 Pain in unspecified limb: Secondary | ICD-10-CM | POA: Insufficient documentation

## 2014-06-04 DIAGNOSIS — R739 Hyperglycemia, unspecified: Secondary | ICD-10-CM

## 2014-06-04 DIAGNOSIS — Z794 Long term (current) use of insulin: Secondary | ICD-10-CM | POA: Insufficient documentation

## 2014-06-04 DIAGNOSIS — F329 Major depressive disorder, single episode, unspecified: Secondary | ICD-10-CM | POA: Insufficient documentation

## 2014-06-04 DIAGNOSIS — F3289 Other specified depressive episodes: Secondary | ICD-10-CM | POA: Insufficient documentation

## 2014-06-04 DIAGNOSIS — R209 Unspecified disturbances of skin sensation: Secondary | ICD-10-CM | POA: Insufficient documentation

## 2014-06-04 LAB — BASIC METABOLIC PANEL
Anion gap: 12 (ref 5–15)
BUN: 11 mg/dL (ref 6–23)
CO2: 26 mEq/L (ref 19–32)
Calcium: 8.8 mg/dL (ref 8.4–10.5)
Chloride: 97 mEq/L (ref 96–112)
Creatinine, Ser: 0.43 mg/dL — ABNORMAL LOW (ref 0.50–1.10)
GFR calc Af Amer: >90 mL/min (ref >90)
GFR calc non Af Amer: >90 mL/min (ref >90)
Glucose, Bld: 403 mg/dL — ABNORMAL HIGH (ref 70–99)
Potassium: 4 mEq/L (ref 3.7–5.3)
Sodium: 135 mEq/L — ABNORMAL LOW (ref 137–147)

## 2014-06-04 LAB — CBC
HCT: 36.6 % (ref 36.0–46.0)
Hemoglobin: 13.3 g/dL (ref 12.0–15.0)
MCH: 29.2 pg (ref 26.0–34.0)
MCHC: 36.3 g/dL — ABNORMAL HIGH (ref 30.0–36.0)
MCV: 80.4 fL (ref 78.0–100.0)
Platelets: 258 10*3/uL (ref 150–400)
RBC: 4.55 MIL/uL (ref 3.87–5.11)
RDW: 12.7 % (ref 11.5–15.5)
WBC: 5.3 10*3/uL (ref 4.0–10.5)

## 2014-06-04 LAB — CBG MONITORING, ED
GLUCOSE-CAPILLARY: 313 mg/dL — AB (ref 70–99)
Glucose-Capillary: 400 mg/dL — ABNORMAL HIGH (ref 70–99)

## 2014-06-04 MED ORDER — ACETAMINOPHEN 500 MG PO TABS
1000.0000 mg | ORAL_TABLET | Freq: Once | ORAL | Status: AC
  Administered 2014-06-04: 1000 mg via ORAL
  Filled 2014-06-04: qty 2

## 2014-06-04 MED ORDER — SODIUM CHLORIDE 0.9 % IV BOLUS (SEPSIS)
2000.0000 mL | Freq: Once | INTRAVENOUS | Status: AC
  Administered 2014-06-04: 2000 mL via INTRAVENOUS

## 2014-06-04 NOTE — Discharge Instructions (Signed)
Hyperglycemia °Hyperglycemia occurs when the glucose (sugar) in your blood is too high. Hyperglycemia can happen for many reasons, but it most often happens to people who do not know they have diabetes or are not managing their diabetes properly.  °CAUSES  °Whether you have diabetes or not, there are other causes of hyperglycemia. Hyperglycemia can occur when you have diabetes, but it can also occur in other situations that you might not be as aware of, such as: °Diabetes °· If you have diabetes and are having problems controlling your blood glucose, hyperglycemia could occur because of some of the following reasons: °¨ Not following your meal plan. °¨ Not taking your diabetes medications or not taking it properly. °¨ Exercising less or doing less activity than you normally do. °¨ Being sick. °Pre-diabetes °· This cannot be ignored. Before people develop Type 2 diabetes, they almost always have "pre-diabetes." This is when your blood glucose levels are higher than normal, but not yet high enough to be diagnosed as diabetes. Research has shown that some long-term damage to the body, especially the heart and circulatory system, may already be occurring during pre-diabetes. If you take action to manage your blood glucose when you have pre-diabetes, you may delay or prevent Type 2 diabetes from developing. °Stress °· If you have diabetes, you may be "diet" controlled or on oral medications or insulin to control your diabetes. However, you may find that your blood glucose is higher than usual in the hospital whether you have diabetes or not. This is often referred to as "stress hyperglycemia." Stress can elevate your blood glucose. This happens because of hormones put out by the body during times of stress. If stress has been the cause of your high blood glucose, it can be followed regularly by your caregiver. That way he/she can make sure your hyperglycemia does not continue to get worse or progress to  diabetes. °Steroids °· Steroids are medications that act on the infection fighting system (immune system) to block inflammation or infection. One side effect can be a rise in blood glucose. Most people can produce enough extra insulin to allow for this rise, but for those who cannot, steroids make blood glucose levels go even higher. It is not unusual for steroid treatments to "uncover" diabetes that is developing. It is not always possible to determine if the hyperglycemia will go away after the steroids are stopped. A special blood test called an A1c is sometimes done to determine if your blood glucose was elevated before the steroids were started. °SYMPTOMS °· Thirsty. °· Frequent urination. °· Dry mouth. °· Blurred vision. °· Tired or fatigue. °· Weakness. °· Sleepy. °· Tingling in feet or leg. °DIAGNOSIS  °Diagnosis is made by monitoring blood glucose in one or all of the following ways: °· A1c test. This is a chemical found in your blood. °· Fingerstick blood glucose monitoring. °· Laboratory results. °TREATMENT  °First, knowing the cause of the hyperglycemia is important before the hyperglycemia can be treated. Treatment may include, but is not be limited to: °· Education. °· Change or adjustment in medications. °· Change or adjustment in meal plan. °· Treatment for an illness, infection, etc. °· More frequent blood glucose monitoring. °· Change in exercise plan. °· Decreasing or stopping steroids. °· Lifestyle changes. °HOME CARE INSTRUCTIONS  °· Test your blood glucose as directed. °· Exercise regularly. Your caregiver will give you instructions about exercise. Pre-diabetes or diabetes which comes on with stress is helped by exercising. °· Eat wholesome,   balanced meals. Eat often and at regular, fixed times. Your caregiver or nutritionist will give you a meal plan to guide your sugar intake.  Being at an ideal weight is important. If needed, losing as little as 10 to 15 pounds may help improve blood  glucose levels. SEEK MEDICAL CARE IF:   You have questions about medicine, activity, or diet.  You continue to have symptoms (problems such as increased thirst, urination, or weight gain). SEEK IMMEDIATE MEDICAL CARE IF:   You are vomiting or have diarrhea.  Your breath smells fruity.  You are breathing faster or slower.  You are very sleepy or incoherent.  You have numbness, tingling, or pain in your feet or hands.  You have chest pain.  Your symptoms get worse even though you have been following your caregiver's orders.  If you have any other questions or concerns. Document Released: 03/03/2001 Document Revised: 11/30/2011 Document Reviewed: 01/04/2012 [facility] Patient Information 2015 [place], [place]. This information is not intended to replace advice given to you by your health care provider. Make sure you discuss any questions you have with your health care provider.    Diabetes and Foot Care Diabetes may cause you to have problems because of poor blood supply (circulation) to your feet and legs. This may cause the skin on your feet to become thinner, break easier, and heal more slowly. Your skin may become dry, and the skin may peel and crack. You may also have nerve damage in your legs and feet causing decreased feeling in them. You may not notice minor injuries to your feet that could lead to infections or more serious problems. Taking care of your feet is one of the most important things you can do for yourself.  HOME CARE INSTRUCTIONS  Wear shoes at all times, even in the house. Do not go barefoot. Bare feet are easily injured.  Check your feet daily for blisters, cuts, and redness. If you cannot see the bottom of your feet, use a mirror or ask someone for help.  Wash your feet with warm water (do not use hot water) and mild soap. Then pat your feet and the areas between your toes until they are completely dry. Do not soak your feet as this can dry your skin.  Apply a  moisturizing lotion or petroleum jelly (that does not contain alcohol and is unscented) to the skin on your feet and to dry, brittle toenails. Do not apply lotion between your toes.  Trim your toenails straight across. Do not dig under them or around the cuticle. File the edges of your nails with an emery board or nail file.  Do not cut corns or calluses or try to remove them with medicine.  Wear clean socks or stockings every day. Make sure they are not too tight. Do not wear knee-high stockings since they may decrease blood flow to your legs.  Wear shoes that fit properly and have enough cushioning. To break in new shoes, wear them for just a few hours a day. This prevents you from injuring your feet. Always look in your shoes before you put them on to be sure there are no objects inside.  Do not cross your legs. This may decrease the blood flow to your feet.  If you find a minor scrape, cut, or break in the skin on your feet, keep it and the skin around it clean and dry. These areas may be cleansed with mild soap and water. Do not cleanse the  area with peroxide, alcohol, or iodine.  When you remove an adhesive bandage, be sure not to damage the skin around it.  If you have a wound, look at it several times a day to make sure it is healing.  Do not use heating pads or hot water bottles. They may burn your skin. If you have lost feeling in your feet or legs, you may not know it is happening until it is too late.  Make sure your health care provider performs a complete foot exam at least annually or more often if you have foot problems. Report any cuts, sores, or bruises to your health care provider immediately. SEEK MEDICAL CARE IF:   You have an injury that is not healing.  You have cuts or breaks in the skin.  You have an ingrown nail.  You notice redness on your legs or feet.  You feel burning or tingling in your legs or feet.  You have pain or cramps in your legs and  feet.  Your legs or feet are numb.  Your feet always feel cold. SEEK IMMEDIATE MEDICAL CARE IF:   There is increasing redness, swelling, or pain in or around a wound.  There is a red line that goes up your leg.  Pus is coming from a wound.  You develop a fever or as directed by your health care provider.  You notice a bad smell coming from an ulcer or wound. Document Released: 09/04/2000 Document Revised: 05/10/2013 Document Reviewed: 02/14/2013 [facility] Patient Information 2015 [place], [place]. This information is not intended to replace advice given to you by your health care provider. Make sure you discuss any questions you have with your health care provider.

## 2014-06-04 NOTE — Progress Notes (Signed)
[facility] Community Liaison Stacy,  ° °Provided pt with a list of primary care resources and a [facility] Orange Card application to help patient establish primary care.  °

## 2014-06-04 NOTE — ED Provider Notes (Signed)
CSN: [id number]     Arrival date & time 06/04/14  0815 History   First MD Initiated Contact with Patient 06/04/14 [phone]     Chief Complaint  Patient presents with  . Foot Pain  . Hyperglycemia     (Consider location/radiation/quality/duration/timing/severity/associated sxs/prior Treatment) HPI 33 year old female presents with one day of left foot pain. She states the pain starts in her left great toe seems to radiate up to her shin. This was atraumatic. She has diabetes so she went to get it checked out quickly. She took ibuprofen and Naprosyn without any relief. Chest tramadol at home but did not take it because it makes her twitch. Rates the pain as an 8/10. Denies any fevers or chills. Denies any weakness but states it feels tingly on the medial aspect of her left foot. She has noticed hyperglycemia over the past 2 weeks. Her glucose has been between 300 and 400.  Past Medical History  Diagnosis Date  . Diabetes mellitus without complication   . Depression   . High cholesterol   . Anemia   . Carpal tunnel syndrome   . Diabetic neuropathy    Past Surgical History  Procedure Laterality Date  . Cesarean section    . Mouth surgery    . Tubal ligation     Family History  Problem Relation Age of Onset  . Diabetes Mother   . Hypertension Mother   . Hyperlipidemia Mother   . Cancer Father    History  Substance Use Topics  . Smoking status: Never Smoker   . Smokeless tobacco: Never Used  . Alcohol Use: No   OB History   Grav Para Term Preterm Abortions TAB SAB Ect Mult Living                 Review of Systems  Constitutional: Negative for fever.  Musculoskeletal: Positive for arthralgias. Negative for joint swelling.  Neurological: Positive for numbness. Negative for weakness.  All other systems reviewed and are negative.     Allergies  Review of patient's allergies indicates no known allergies.  Home Medications   Prior to Admission medications   Medication Sig  Start Date End Date Taking? Authorizing Provider  butalbital-acetaminophen-caffeine (FIORICET WITH CODEINE) 50-325-40-30 MG per capsule Take 1 capsule by mouth daily as needed for headache or migraine. No more than 5 days at a time    Historical Provider, MD  cyclobenzaprine (FLEXERIL) 10 MG tablet Take 1 tablet (10 mg total) by mouth at bedtime as needed for muscle spasms. 05/22/13   Fayrene Helper, PA-C  furosemide (LASIX) 40 MG tablet Take 40 mg by mouth daily.    Historical Provider, MD  ibuprofen (ADVIL,MOTRIN) 800 MG tablet Take 800 mg by mouth 2 (two) times daily as needed for pain.    Historical Provider, MD  insulin aspart protamine- aspart (NOVOLOG MIX 70/30) (70-30) 100 UNIT/ML injection Inject 25-35 Units into the skin 2 (two) times daily with a meal. Takes 35 units in the morning and 25 units in the evening    Historical Provider, MD  MAGNESIUM PO Take 1 tablet by mouth 2 (two) times daily.     Historical Provider, MD  metFORMIN (GLUCOPHAGE) 500 MG tablet Take 500 mg by mouth 2 (two) times daily with a meal.    Historical Provider, MD  naproxen (NAPROSYN) 500 MG tablet Take 1 tablet (500 mg total) by mouth 2 (two) times daily with a meal. 05/22/13   Fayrene Helper, PA-C  PARoxetine (PAXIL) 40  MG tablet Take 40 mg by mouth every morning.    Historical Provider, MD  predniSONE (DELTASONE) 10 MG tablet Take 10 mg by mouth daily as needed (for tightness in chest).     Historical Provider, MD  traMADol (ULTRAM) 50 MG tablet Take 50 mg by mouth daily as needed for moderate pain.    Historical Provider, MD  traZODone (DESYREL) 150 MG tablet Take 450 mg by mouth at bedtime as needed for sleep.    Historical Provider, MD  vitamin B-12 (CYANOCOBALAMIN) 50 MCG tablet Take 50 mcg by mouth 2 (two) times daily.    Historical Provider, MD   BP 118/73  Pulse 78  Temp(Src) 98.5 F (36.9 C) (Oral)  Resp 16  SpO2 100%  LMP 05/30/2014 Physical Exam  Nursing note and vitals reviewed. Constitutional: She is  oriented to person, place, and time. She appears well-developed and well-nourished.  HENT:  Head: Normocephalic and atraumatic.  Right Ear: External ear normal.  Left Ear: External ear normal.  Nose: Nose normal.  Eyes: Right eye exhibits no discharge. Left eye exhibits no discharge.  Cardiovascular: Normal rate, regular rhythm and normal heart sounds.   Pulmonary/Chest: Effort normal and breath sounds normal.  Abdominal: Soft. She exhibits no distension. There is no tenderness.  Musculoskeletal:       Left lower leg: She exhibits tenderness. She exhibits no bony tenderness.       Legs:      Feet:  Neurological: She is alert and oriented to person, place, and time.  Normal strength to bilateral lower extremities. Patient has abnormal sensation to feeling sharp and dull to both feet.  Skin: Skin is warm and dry.    ED Course  Procedures (including critical care time) Labs Review Labs Reviewed  CBC - Abnormal; Notable for the following:    MCHC 36.3 (*)    All other components within normal limits  BASIC METABOLIC PANEL - Abnormal; Notable for the following:    Sodium 135 (*)    Glucose, Bld 403 (*)    Creatinine, Ser 0.43 (*)    All other components within normal limits  CBG MONITORING, ED - Abnormal; Notable for the following:    Glucose-Capillary 400 (*)    All other components within normal limits  CBG MONITORING, ED    Imaging Review Dg Foot Complete Left  06/04/2014   CLINICAL DATA:  Shooting pain in the left foot.  EXAM: LEFT FOOT - COMPLETE 3+ VIEW  COMPARISON:  None.  FINDINGS: There is no evidence of fracture or dislocation. There is no evidence of arthropathy or other focal bone abnormality. Soft tissues are unremarkable.  IMPRESSION: No acute abnormality.   Electronically Signed   By: Sherian Rein M.D.   On: 06/04/2014 09:59     EKG Interpretation None      MDM   Final diagnoses:  Left foot pain  Hyperglycemia    Patient's glucose is elevated without  any signs of DKA. Has normal anion gap and bicarbonate. No fevers, elevated white blood cell count, wound, or swelling to suggest an infectious process causing her atraumatic foot pain. At this time we'll treat symptomatically with ibuprofen and Tylenol recommend close followup with her PCP for better glucose control.    Audree Camel, MD 06/04/14 1027

## 2014-06-04 NOTE — ED Notes (Addendum)
Pt c/o L foot pain x 1 day.  Pain score 8/10.  Pt reports pain suddenly started in L great toe and radiates into L shin. Hx of DM and sts "I'm getting better at checking my blood sugar and taking my medications."  Denies injury or wounds.

## 2014-06-04 NOTE — ED Notes (Signed)
Patient transported to X-ray 

## 2014-07-02 ENCOUNTER — Emergency Department (HOSPITAL_COMMUNITY)
Admission: EM | Admit: 2014-07-02 | Discharge: 2014-07-02 | Disposition: A | Discharge status: Home / Self Care | Payer: Self-pay | Attending: Emergency Medicine | Admitting: Emergency Medicine

## 2014-07-02 ENCOUNTER — Encounter (HOSPITAL_COMMUNITY): Payer: Self-pay | Admitting: Emergency Medicine

## 2014-07-02 ENCOUNTER — Emergency Department (HOSPITAL_COMMUNITY): Payer: Self-pay

## 2014-07-02 DIAGNOSIS — Z8639 Personal history of other endocrine, nutritional and metabolic disease: Secondary | ICD-10-CM | POA: Insufficient documentation

## 2014-07-02 DIAGNOSIS — Z3202 Encounter for pregnancy test, result negative: Secondary | ICD-10-CM | POA: Insufficient documentation

## 2014-07-02 DIAGNOSIS — N12 Tubulo-interstitial nephritis, not specified as acute or chronic: Secondary | ICD-10-CM | POA: Insufficient documentation

## 2014-07-02 DIAGNOSIS — Z862 Personal history of diseases of the blood and blood-forming organs and certain disorders involving the immune mechanism: Secondary | ICD-10-CM | POA: Insufficient documentation

## 2014-07-02 DIAGNOSIS — Z794 Long term (current) use of insulin: Secondary | ICD-10-CM | POA: Insufficient documentation

## 2014-07-02 DIAGNOSIS — Z791 Long term (current) use of non-steroidal anti-inflammatories (NSAID): Secondary | ICD-10-CM | POA: Insufficient documentation

## 2014-07-02 DIAGNOSIS — Z79899 Other long term (current) drug therapy: Secondary | ICD-10-CM | POA: Insufficient documentation

## 2014-07-02 DIAGNOSIS — F329 Major depressive disorder, single episode, unspecified: Secondary | ICD-10-CM | POA: Insufficient documentation

## 2014-07-02 LAB — URINALYSIS, ROUTINE W REFLEX MICROSCOPIC
Bilirubin Urine: NEGATIVE
KETONES UR: NEGATIVE mg/dL
Nitrite: NEGATIVE
PROTEIN: NEGATIVE mg/dL
Specific Gravity, Urine: 1.039 — ABNORMAL HIGH (ref 1.005–1.030)
Urobilinogen, UA: 0.2 mg/dL (ref 0.0–1.0)
pH: 5 (ref 5.0–8.0)

## 2014-07-02 LAB — COMPREHENSIVE METABOLIC PANEL
ALT: 18 U/L (ref 0–35)
ANION GAP: 15 (ref 5–15)
AST: 17 U/L (ref 0–37)
Albumin: 3.3 g/dL — ABNORMAL LOW (ref 3.5–5.2)
Alkaline Phosphatase: 80 U/L (ref 39–117)
BUN: 14 mg/dL (ref 6–23)
CALCIUM: 8.7 mg/dL (ref 8.4–10.5)
CO2: 20 meq/L (ref 19–32)
Chloride: 99 mEq/L (ref 96–112)
Creatinine, Ser: 0.37 mg/dL — ABNORMAL LOW (ref 0.50–1.10)
GLUCOSE: 405 mg/dL — AB (ref 70–99)
Potassium: 4.3 mEq/L (ref 3.7–5.3)
SODIUM: 134 meq/L — AB (ref 137–147)
Total Bilirubin: 0.4 mg/dL (ref 0.3–1.2)
Total Protein: 6.8 g/dL (ref 6.0–8.3)

## 2014-07-02 LAB — URINE MICROSCOPIC-ADD ON

## 2014-07-02 LAB — CBC WITH DIFFERENTIAL/PLATELET
Basophils Absolute: 0 10*3/uL (ref 0.0–0.1)
Basophils Relative: 1 % (ref 0–1)
EOS PCT: 3 % (ref 0–5)
Eosinophils Absolute: 0.2 10*3/uL (ref 0.0–0.7)
HEMATOCRIT: 36.2 % (ref 36.0–46.0)
HEMOGLOBIN: 13 g/dL (ref 12.0–15.0)
LYMPHS ABS: 1.3 10*3/uL (ref 0.7–4.0)
LYMPHS PCT: 25 % (ref 12–46)
MCH: 29.5 pg (ref 26.0–34.0)
MCHC: 35.9 g/dL (ref 30.0–36.0)
MCV: 82.3 fL (ref 78.0–100.0)
MONO ABS: 0.5 10*3/uL (ref 0.1–1.0)
MONOS PCT: 9 % (ref 3–12)
Neutro Abs: 3.4 10*3/uL (ref 1.7–7.7)
Neutrophils Relative %: 62 % (ref 43–77)
PLATELETS: 199 10*3/uL (ref 150–400)
RBC: 4.4 MIL/uL (ref 3.87–5.11)
RDW: 12.8 % (ref 11.5–15.5)
WBC: 5.4 10*3/uL (ref 4.0–10.5)

## 2014-07-02 LAB — CBG MONITORING, ED: Glucose-Capillary: 389 mg/dL — ABNORMAL HIGH (ref 70–99)

## 2014-07-02 LAB — LIPASE, BLOOD: Lipase: 31 U/L (ref 11–59)

## 2014-07-02 LAB — PREGNANCY, URINE: Preg Test, Ur: NEGATIVE

## 2014-07-02 MED ORDER — ONDANSETRON 8 MG PO TBDP: 8.0000 mg | ORAL_TABLET | Freq: Three times a day (TID) | ORAL | Status: DC | PRN

## 2014-07-02 MED ORDER — MORPHINE SULFATE 4 MG/ML IJ SOLN: 6.0000 mg | Freq: Once | INTRAMUSCULAR | Status: DC

## 2014-07-02 MED ORDER — MORPHINE SULFATE 4 MG/ML IJ SOLN
6.0000 mg | Freq: Once | INTRAMUSCULAR | Status: AC
  Administered 2014-07-02: 6 mg via INTRAVENOUS
  Filled 2014-07-02: qty 2

## 2014-07-02 MED ORDER — OXYCODONE-ACETAMINOPHEN 5-325 MG PO TABS: 1.0000 | ORAL_TABLET | ORAL | Status: DC | PRN

## 2014-07-02 MED ORDER — CEPHALEXIN 500 MG PO CAPS: 500.0000 mg | ORAL_CAPSULE | Freq: Three times a day (TID) | ORAL | Status: DC

## 2014-07-02 MED ORDER — SODIUM CHLORIDE 0.9 % IV SOLN
Freq: Once | INTRAVENOUS | Status: AC
  Administered 2014-07-02: 10:00:00 via INTRAVENOUS

## 2014-07-02 MED ORDER — DEXTROSE 5 % IV SOLN
1.0000 g | Freq: Once | INTRAVENOUS | Status: AC
  Administered 2014-07-02: 1 g via INTRAVENOUS
  Filled 2014-07-02: qty 10

## 2014-07-02 NOTE — ED Notes (Signed)
Per pt, states right head pain that radiates down right side-which started Monday

## 2014-07-02 NOTE — ED Notes (Signed)
Patient in restroom attempting to urinate at this time

## 2014-07-02 NOTE — Discharge Instructions (Signed)
Pyelonephritis, Adult °Pyelonephritis is a kidney infection. In general, there are 2 main types of pyelonephritis: °· Infections that come on quickly without any warning (acute pyelonephritis). °· Infections that persist for a long period of time (chronic pyelonephritis). °CAUSES  °Two main causes of pyelonephritis are: °· Bacteria traveling from the bladder to the kidney. This is a problem especially in pregnant women. The urine in the bladder can become filled with bacteria from multiple causes, including: °¨ Inflammation of the prostate gland (prostatitis). °¨ Sexual intercourse in females. °¨ Bladder infection (cystitis). °· Bacteria traveling from the bloodstream to the tissue part of the kidney. °Problems that may increase your risk of getting a kidney infection include: °· Diabetes. °· Kidney stones or bladder stones. °· Cancer. °· Catheters placed in the bladder. °· Other abnormalities of the kidney or ureter. °SYMPTOMS  °· Abdominal pain. °· Pain in the side or flank area. °· Fever. °· Chills. °· Upset stomach. °· Blood in the urine (dark urine). °· Frequent urination. °· Strong or persistent urge to urinate. °· Burning or stinging when urinating. °DIAGNOSIS  °Your caregiver may diagnose your kidney infection based on your symptoms. A urine sample may also be taken. °TREATMENT  °In general, treatment depends on how severe the infection is.  °· If the infection is mild and caught early, your caregiver may treat you with oral antibiotics and send you home. °· If the infection is more severe, the bacteria may have gotten into the bloodstream. This will require intravenous (IV) antibiotics and a hospital stay. Symptoms may include: °¨ High fever. °¨ Severe flank pain. °¨ Shaking chills. °· Even after a hospital stay, your caregiver may require you to be on oral antibiotics for a period of time. °· Other treatments may be required depending upon the cause of the infection. °HOME CARE INSTRUCTIONS  °· Take your  antibiotics as directed. Finish them even if you start to feel better. °· Make an appointment to have your urine checked to make sure the infection is gone. °· Drink enough fluids to keep your urine clear or pale yellow. °· Take medicines for the bladder if you have urgency and frequency of urination as directed by your caregiver. °SEEK IMMEDIATE MEDICAL CARE IF:  °· You have a fever or persistent symptoms for more than 2-3 days. °· You have a fever and your symptoms suddenly get worse. °· You are unable to take your antibiotics or fluids. °· You develop shaking chills. °· You experience extreme weakness or fainting. °· There is no improvement after 2 days of treatment. °MAKE SURE YOU: °· Understand these instructions. °· Will watch your condition. °· Will get help right away if you are not doing well or get worse. °Document Released: 09/07/2005 Document Revised: 03/08/2012 Document Reviewed: 02/11/2011 °ExitCare® Patient Information ©2015 [employer]. This information is not intended to replace advice given to you by your health care provider. Make sure you discuss any questions you have with your health care provider. ° °

## 2014-07-02 NOTE — ED Provider Notes (Signed)
CSN: [id number]     Arrival date & time 07/02/14  [id number] History   First MD Initiated Contact with Patient 07/02/14 [phone]     Chief Complaint  Patient presents with  . Headache      HPI Patient presents emergency department complaining of some new right upper quadrant abdominal pain without nausea or vomiting.  She has no urinary symptoms.  She states she has chronic pain from her right shoulder down her right leg.  She states this pain in her right upper quadrant is different and abnormal for her.  She still has her gallbladder.  She denies flank pain.  She does have mild headache as well.  She states this is a persistent thing for her as well.  No new weakness of her arms or legs.  No recent injury or trauma.  No chest pain or shortness of breath.  No cough.   Past Medical History  Diagnosis Date  . Diabetes mellitus without complication   . Depression   . High cholesterol   . Anemia   . Carpal tunnel syndrome   . Diabetic neuropathy    Past Surgical History  Procedure Laterality Date  . Cesarean section    . Mouth surgery    . Tubal ligation     Family History  Problem Relation Age of Onset  . Diabetes Mother   . Hypertension Mother   . Hyperlipidemia Mother   . Cancer Father    History  Substance Use Topics  . Smoking status: Never Smoker   . Smokeless tobacco: Never Used  . Alcohol Use: No   OB History   Grav Para Term Preterm Abortions TAB SAB Ect Mult Living                 Review of Systems  All other systems reviewed and are negative.     Allergies  Magnesium-containing compounds and Tramadol  Home Medications   Prior to Admission medications   Medication Sig Start Date End Date Taking? Authorizing Provider  butalbital-acetaminophen-caffeine (FIORICET WITH CODEINE) 50-325-40-30 MG per capsule Take 1 capsule by mouth daily as needed for headache or migraine. No more than 5 days at a time   Yes Historical Provider, MD  cyclobenzaprine (FLEXERIL) 10 MG  tablet Take 10 mg by mouth 2 (two) times daily as needed for muscle spasms.   Yes Historical Provider, MD  furosemide (LASIX) 40 MG tablet Take 40 mg by mouth daily.   Yes Historical Provider, MD  ibuprofen (ADVIL,MOTRIN) 800 MG tablet Take 800 mg by mouth 2 (two) times daily as needed for pain.   Yes Historical Provider, MD  insulin aspart protamine- aspart (NOVOLOG MIX 70/30) (70-30) 100 UNIT/ML injection Inject 25-35 Units into the skin 2 (two) times daily with a meal. Takes 35 units in the morning and 25 units in the evening   Yes Historical Provider, MD  metFORMIN (GLUCOPHAGE) 500 MG tablet Take 500 mg by mouth 2 (two) times daily with a meal.   Yes Historical Provider, MD  naproxen (NAPROSYN) 500 MG tablet Take 1,000-1,500 mg by mouth 2 (two) times daily with a meal.   Yes Historical Provider, MD  predniSONE (DELTASONE) 10 MG tablet Take 10 mg by mouth daily as needed (for tightness in chest).    Yes Historical Provider, MD  traMADol (ULTRAM) 50 MG tablet Take 50 mg by mouth 2 (two) times daily as needed for moderate pain.   Yes Historical Provider, MD  traZODone (DESYREL) 150 MG  tablet Take 450 mg by mouth at bedtime as needed for sleep.   Yes Historical Provider, MD  vitamin B-12 (CYANOCOBALAMIN) 50 MCG tablet Take 50 mcg by mouth 2 (two) times daily.   Yes Historical Provider, MD  cephALEXin (KEFLEX) 500 MG capsule Take 1 capsule (500 mg total) by mouth 3 (three) times daily. 07/02/14   Lyanne CoKevin M Ostin Mathey, MD  ondansetron (ZOFRAN ODT) 8 MG disintegrating tablet Take 1 tablet (8 mg total) by mouth every 8 (eight) hours as needed for nausea or vomiting. 07/02/14   Lyanne CoKevin M Roshanna Cimino, MD  oxyCODONE-acetaminophen (PERCOCET/ROXICET) 5-325 MG per tablet Take 1 tablet by mouth every 4 (four) hours as needed for severe pain. 07/02/14   Lyanne CoKevin M Devlin Brink, MD   BP 116/74  Pulse 78  Temp(Src) 98 F (36.7 C) (Oral)  Resp 16  SpO2 100%  LMP 06/22/2014 Physical Exam  Nursing note and vitals  reviewed. Constitutional: She is oriented to person, place, and time. She appears well-developed and well-nourished. No distress.  HENT:  Head: Normocephalic and atraumatic.  Eyes: EOM are normal.  Neck: Normal range of motion.  Cardiovascular: Normal rate, regular rhythm and normal heart sounds.   Pulmonary/Chest: Effort normal and breath sounds normal.  Abdominal: Soft. She exhibits no distension.  Mild right-sided and right upper quadrant abdominal pain.  No right lower quadrant pain present.  Musculoskeletal: Normal range of motion.  Neurological: She is alert and oriented to person, place, and time.  Skin: Skin is warm and dry.  Psychiatric: She has a normal mood and affect. Judgment normal.    ED Course  Procedures (including critical care time) Labs Review Labs Reviewed  COMPREHENSIVE METABOLIC PANEL - Abnormal; Notable for the following:    Sodium 134 (*)    Glucose, Bld 405 (*)    Creatinine, Ser 0.37 (*)    Albumin 3.3 (*)    All other components within normal limits  URINALYSIS, ROUTINE W REFLEX MICROSCOPIC - Abnormal; Notable for the following:    APPearance CLOUDY (*)    Specific Gravity, Urine 1.039 (*)    Glucose, UA >1000 (*)    Hgb urine dipstick MODERATE (*)    Leukocytes, UA MODERATE (*)    All other components within normal limits  URINE MICROSCOPIC-ADD ON - Abnormal; Notable for the following:    Bacteria, UA MANY (*)    All other components within normal limits  CBG MONITORING, ED - Abnormal; Notable for the following:    Glucose-Capillary 389 (*)    All other components within normal limits  URINE CULTURE  CBC WITH DIFFERENTIAL  LIPASE, BLOOD  PREGNANCY, URINE    Imaging Review [place] Abdomen Complete  07/02/2014   CLINICAL DATA:  Right upper quadrant pain.  EXAM: ULTRASOUND ABDOMEN COMPLETE  COMPARISON:  None.  FINDINGS: Gallbladder: No gallstones or wall thickening visualized. No sonographic Murphy sign noted.  Common bile duct: Diameter: 3.7 mm,  normal.  Liver: No focal lesion identified. Within normal limits in parenchymal echogenicity.  IVC: Normal.  Pancreas: Normal.  Spleen: Normal.  9.7 cm in length.  Right Kidney: Length: 13.3 cm. Slight prominence of the calices and infundibula. The renal pelvis is not dilated.  Left Kidney: Length: 13.5 cm. Echogenicity within normal limits. No mass or hydronephrosis visualized.  Abdominal aorta: 2.3 cm maximum diameter, normal.  Other findings: None.  IMPRESSION: Slight prominence of the calices and infundibula of the right kidney. Does the patient have hematuria? No appreciable mass or stones.  Otherwise, normal  exam.   Electronically Signed   By: Geanie Cooley M.D.   On: 07/02/2014 09:47     EKG Interpretation None      MDM   Final diagnoses:  Pyelonephritis    Gallbladder looks normal on ultrasound.  Question abnormality around the calyces of the right kidney.  With her urine suggestive of urinary tract infection I suspect this is right-sided pyelonephritis giving the patient her right-sided abdominal pain.  No hydronephrosis.  Doubt appendicitis.  Patient given an IV dose of antibiotics.  Urine culture sent.  Patient be sent home with pain medicine and antibiotics.  She understands return to the ER for new or worsening symptoms.    Lyanne Co, MD 07/02/14 1134

## 2014-07-04 ENCOUNTER — Emergency Department (HOSPITAL_COMMUNITY): Payer: Self-pay

## 2014-07-04 ENCOUNTER — Emergency Department (HOSPITAL_COMMUNITY)
Admission: EM | Admit: 2014-07-04 | Discharge: 2014-07-04 | Disposition: A | Discharge status: Home / Self Care | Payer: Self-pay | Attending: Emergency Medicine | Admitting: Emergency Medicine

## 2014-07-04 ENCOUNTER — Encounter (HOSPITAL_COMMUNITY): Payer: Self-pay | Admitting: Emergency Medicine

## 2014-07-04 DIAGNOSIS — Z9851 Tubal ligation status: Secondary | ICD-10-CM | POA: Insufficient documentation

## 2014-07-04 DIAGNOSIS — Z79899 Other long term (current) drug therapy: Secondary | ICD-10-CM | POA: Insufficient documentation

## 2014-07-04 DIAGNOSIS — Z3202 Encounter for pregnancy test, result negative: Secondary | ICD-10-CM | POA: Insufficient documentation

## 2014-07-04 DIAGNOSIS — E1140 Type 2 diabetes mellitus with diabetic neuropathy, unspecified: Secondary | ICD-10-CM | POA: Insufficient documentation

## 2014-07-04 DIAGNOSIS — Z794 Long term (current) use of insulin: Secondary | ICD-10-CM | POA: Insufficient documentation

## 2014-07-04 DIAGNOSIS — Z9889 Other specified postprocedural states: Secondary | ICD-10-CM | POA: Insufficient documentation

## 2014-07-04 DIAGNOSIS — D649 Anemia, unspecified: Secondary | ICD-10-CM | POA: Insufficient documentation

## 2014-07-04 DIAGNOSIS — F329 Major depressive disorder, single episode, unspecified: Secondary | ICD-10-CM | POA: Insufficient documentation

## 2014-07-04 DIAGNOSIS — N83202 Unspecified ovarian cyst, left side: Secondary | ICD-10-CM

## 2014-07-04 DIAGNOSIS — R109 Unspecified abdominal pain: Secondary | ICD-10-CM

## 2014-07-04 DIAGNOSIS — R110 Nausea: Secondary | ICD-10-CM | POA: Insufficient documentation

## 2014-07-04 DIAGNOSIS — Z792 Long term (current) use of antibiotics: Secondary | ICD-10-CM | POA: Insufficient documentation

## 2014-07-04 DIAGNOSIS — N8320 Unspecified ovarian cysts: Secondary | ICD-10-CM | POA: Insufficient documentation

## 2014-07-04 DIAGNOSIS — Z8669 Personal history of other diseases of the nervous system and sense organs: Secondary | ICD-10-CM | POA: Insufficient documentation

## 2014-07-04 DIAGNOSIS — N83201 Unspecified ovarian cyst, right side: Secondary | ICD-10-CM

## 2014-07-04 DIAGNOSIS — Z87440 Personal history of urinary (tract) infections: Secondary | ICD-10-CM | POA: Insufficient documentation

## 2014-07-04 LAB — COMPREHENSIVE METABOLIC PANEL
ALT: 20 U/L (ref 0–35)
AST: 18 U/L (ref 0–37)
Albumin: 3.1 g/dL — ABNORMAL LOW (ref 3.5–5.2)
Alkaline Phosphatase: 77 U/L (ref 39–117)
Anion gap: 14 (ref 5–15)
BUN: 9 mg/dL (ref 6–23)
CO2: 25 meq/L (ref 19–32)
Calcium: 8.7 mg/dL (ref 8.4–10.5)
Chloride: 99 mEq/L (ref 96–112)
Creatinine, Ser: 0.47 mg/dL — ABNORMAL LOW (ref 0.50–1.10)
GLUCOSE: 382 mg/dL — AB (ref 70–99)
Potassium: 4.1 mEq/L (ref 3.7–5.3)
SODIUM: 138 meq/L (ref 137–147)
Total Bilirubin: 0.4 mg/dL (ref 0.3–1.2)
Total Protein: 6.6 g/dL (ref 6.0–8.3)

## 2014-07-04 LAB — URINE MICROSCOPIC-ADD ON

## 2014-07-04 LAB — CBC WITH DIFFERENTIAL/PLATELET
Basophils Absolute: 0 10*3/uL (ref 0.0–0.1)
Basophils Relative: 1 % (ref 0–1)
Eosinophils Absolute: 0.1 10*3/uL (ref 0.0–0.7)
Eosinophils Relative: 3 % (ref 0–5)
HCT: 35.7 % — ABNORMAL LOW (ref 36.0–46.0)
HEMOGLOBIN: 12.9 g/dL (ref 12.0–15.0)
LYMPHS ABS: 1.4 10*3/uL (ref 0.7–4.0)
LYMPHS PCT: 31 % (ref 12–46)
MCH: 29.3 pg (ref 26.0–34.0)
MCHC: 36.1 g/dL — ABNORMAL HIGH (ref 30.0–36.0)
MCV: 81 fL (ref 78.0–100.0)
MONO ABS: 0.3 10*3/uL (ref 0.1–1.0)
Monocytes Relative: 7 % (ref 3–12)
Neutro Abs: 2.6 10*3/uL (ref 1.7–7.7)
Neutrophils Relative %: 58 % (ref 43–77)
PLATELETS: 209 10*3/uL (ref 150–400)
RBC: 4.41 MIL/uL (ref 3.87–5.11)
RDW: 12.7 % (ref 11.5–15.5)
WBC: 4.4 10*3/uL (ref 4.0–10.5)

## 2014-07-04 LAB — URINALYSIS, ROUTINE W REFLEX MICROSCOPIC
Bilirubin Urine: NEGATIVE
Glucose, UA: >1000 mg/dL — AB
Ketones, ur: 15 mg/dL — AB
Leukocytes, UA: NEGATIVE
Nitrite: NEGATIVE
Protein, ur: NEGATIVE mg/dL
Urobilinogen, UA: 0.2 mg/dL (ref 0.0–1.0)
pH: 6 (ref 5.0–8.0)

## 2014-07-04 LAB — URINE CULTURE

## 2014-07-04 LAB — LIPASE, BLOOD: Lipase: 28 U/L (ref 11–59)

## 2014-07-04 LAB — POC URINE PREG, ED: Preg Test, Ur: NEGATIVE

## 2014-07-04 MED ORDER — IOHEXOL 300 MG/ML  SOLN
100.0000 mL | Freq: Once | INTRAMUSCULAR | Status: AC | PRN
  Administered 2014-07-04: 100 mL via INTRAVENOUS

## 2014-07-04 MED ORDER — IBUPROFEN 800 MG PO TABS: 800.0000 mg | ORAL_TABLET | Freq: Three times a day (TID) | ORAL | Status: DC

## 2014-07-04 MED ORDER — HYDROMORPHONE HCL 1 MG/ML IJ SOLN
1.0000 mg | Freq: Once | INTRAMUSCULAR | Status: AC
  Administered 2014-07-04: 1 mg via INTRAVENOUS
  Filled 2014-07-04: qty 1

## 2014-07-04 MED ORDER — ONDANSETRON HCL 4 MG/2ML IJ SOLN
4.0000 mg | Freq: Once | INTRAMUSCULAR | Status: AC
  Administered 2014-07-04: 4 mg via INTRAVENOUS
  Filled 2014-07-04: qty 2

## 2014-07-04 NOTE — ED Provider Notes (Signed)
CSN: [id number]     Arrival date & time 07/04/14  [id number] History   First MD Initiated Contact with Patient 07/04/14 0845     Chief Complaint  Patient presents with  . Abdominal Pain     (Consider location/radiation/quality/duration/timing/severity/associated sxs/prior Treatment) HPI Comments:  Patient presents to the emergency department with chief complaint of right sided abdominal pain. She states that she is out of pain for the past couple of days. She states the pain is worsening. She was seen 2 days ago was along the emergency department, and was treated for UTI. She states that she has been taking her antibiotics and her pain medicine with no relief. She denies any fevers, chills, vomiting, diarrhea, constipation, Dysuria, or vaginal discharge. She states that she has had some nausea. She denies any past abdominal surgical history. She states pain radiates to her right side. It is worsened with movement and palpation.  The history is provided by the patient. No language interpreter was used.    Past Medical History  Diagnosis Date  . Diabetes mellitus without complication   . Depression   . High cholesterol   . Anemia   . Carpal tunnel syndrome   . Diabetic neuropathy    Past Surgical History  Procedure Laterality Date  . Cesarean section    . Mouth surgery    . Tubal ligation     Family History  Problem Relation Age of Onset  . Diabetes Mother   . Hypertension Mother   . Hyperlipidemia Mother   . Cancer Father    History  Substance Use Topics  . Smoking status: Never Smoker   . Smokeless tobacco: Never Used  . Alcohol Use: No   OB History   Grav Para Term Preterm Abortions TAB SAB Ect Mult Living                 Review of Systems  Constitutional: Negative for fever and chills.  Respiratory: Negative for shortness of breath.   Cardiovascular: Negative for chest pain.  Gastrointestinal: Positive for nausea and abdominal pain. Negative for vomiting, diarrhea and  constipation.  Genitourinary: Negative for dysuria.  All other systems reviewed and are negative.     Allergies  Magnesium-containing compounds and Tramadol  Home Medications   Prior to Admission medications   Medication Sig Start Date End Date Taking? Authorizing Provider  butalbital-acetaminophen-caffeine (FIORICET WITH CODEINE) 50-325-40-30 MG per capsule Take 1 capsule by mouth daily as needed for headache or migraine. No more than 5 days at a time    Historical Provider, MD  cephALEXin (KEFLEX) 500 MG capsule Take 1 capsule (500 mg total) by mouth 3 (three) times daily. 07/02/14   Lyanne CoKevin M Campos, MD  cyclobenzaprine (FLEXERIL) 10 MG tablet Take 10 mg by mouth 2 (two) times daily as needed for muscle spasms.    Historical Provider, MD  furosemide (LASIX) 40 MG tablet Take 40 mg by mouth daily.    Historical Provider, MD  ibuprofen (ADVIL,MOTRIN) 800 MG tablet Take 800 mg by mouth 2 (two) times daily as needed for pain.    Historical Provider, MD  insulin aspart protamine- aspart (NOVOLOG MIX 70/30) (70-30) 100 UNIT/ML injection Inject 25-35 Units into the skin 2 (two) times daily with a meal. Takes 35 units in the morning and 25 units in the evening    Historical Provider, MD  metFORMIN (GLUCOPHAGE) 500 MG tablet Take 500 mg by mouth 2 (two) times daily with a meal.    Historical  Provider, MD  naproxen (NAPROSYN) 500 MG tablet Take 1,000-1,500 mg by mouth 2 (two) times daily with a meal.    Historical Provider, MD  ondansetron (ZOFRAN ODT) 8 MG disintegrating tablet Take 1 tablet (8 mg total) by mouth every 8 (eight) hours as needed for nausea or vomiting. 07/02/14   Lyanne Co, MD  oxyCODONE-acetaminophen (PERCOCET/ROXICET) 5-325 MG per tablet Take 1 tablet by mouth every 4 (four) hours as needed for severe pain. 07/02/14   Lyanne Co, MD  predniSONE (DELTASONE) 10 MG tablet Take 10 mg by mouth daily as needed (for tightness in chest).     Historical Provider, MD  traMADol  (ULTRAM) 50 MG tablet Take 50 mg by mouth 2 (two) times daily as needed for moderate pain.    Historical Provider, MD  traZODone (DESYREL) 150 MG tablet Take 450 mg by mouth at bedtime as needed for sleep.    Historical Provider, MD  vitamin B-12 (CYANOCOBALAMIN) 50 MCG tablet Take 50 mcg by mouth 2 (two) times daily.    Historical Provider, MD   BP 120/65  Temp(Src) 97.9 F (36.6 C) (Oral)  Resp 16  Ht 5\' 3"  (1.6 m)  Wt 221 lb (100.245 kg)  BMI 39.16 kg/m2  SpO2 99%  LMP 06/22/2014 Physical Exam  Nursing note and vitals reviewed. Constitutional: She is oriented to person, place, and time. She appears well-developed and well-nourished.  HENT:  Head: Normocephalic and atraumatic.  Eyes: Conjunctivae and EOM are normal. Pupils are equal, round, and reactive to light.  Neck: Normal range of motion. Neck supple.  Cardiovascular: Normal rate and regular rhythm.  Exam reveals no gallop and no friction rub.   No murmur heard. Pulmonary/Chest: Effort normal and breath sounds normal. No respiratory distress. She has no wheezes. She has no rales. She exhibits no tenderness.  Abdominal: Soft. She exhibits no distension and no mass. There is tenderness. There is no rebound and no guarding.  Right abdominal tenderness to palpation, no focal left-sided abdominal tenderness  Musculoskeletal: Normal range of motion. She exhibits no edema and no tenderness.  Neurological: She is alert and oriented to person, place, and time.  Skin: Skin is warm and dry.  Psychiatric: She has a normal mood and affect. Her behavior is normal. Judgment and thought content normal.    ED Course  Procedures (including critical care time) Results for orders placed during the hospital encounter of 07/04/14  URINALYSIS, ROUTINE W REFLEX MICROSCOPIC      Result Value Ref Range   Color, Urine YELLOW  YELLOW   APPearance CLEAR  CLEAR   Specific Gravity, Urine >1.046 (*) 1.005 - 1.030   pH 6.0  5.0 - 8.0   Glucose, UA  >1000 (*) NEGATIVE mg/dL   Hgb urine dipstick MODERATE (*) NEGATIVE   Bilirubin Urine NEGATIVE  NEGATIVE   Ketones, ur 15 (*) NEGATIVE mg/dL   Protein, ur NEGATIVE  NEGATIVE mg/dL   Urobilinogen, UA 0.2  0.0 - 1.0 mg/dL   Nitrite NEGATIVE  NEGATIVE   Leukocytes, UA NEGATIVE  NEGATIVE  CBC WITH DIFFERENTIAL      Result Value Ref Range   WBC 4.4  4.0 - 10.5 K/uL   RBC 4.41  3.87 - 5.11 MIL/uL   Hemoglobin 12.9  12.0 - 15.0 g/dL   HCT 16.1 (*) 09.6 - 04.5 %   MCV 81.0  78.0 - 100.0 fL   MCH 29.3  26.0 - 34.0 pg   MCHC 36.1 (*) 30.0 -  36.0 g/dL   RDW 16.112.7  09.611.5 - 04.515.5 %   Platelets 209  150 - 400 K/uL   Neutrophils Relative % 58  43 - 77 %   Neutro Abs 2.6  1.7 - 7.7 K/uL   Lymphocytes Relative 31  12 - 46 %   Lymphs Abs 1.4  0.7 - 4.0 K/uL   Monocytes Relative 7  3 - 12 %   Monocytes Absolute 0.3  0.1 - 1.0 K/uL   Eosinophils Relative 3  0 - 5 %   Eosinophils Absolute 0.1  0.0 - 0.7 K/uL   Basophils Relative 1  0 - 1 %   Basophils Absolute 0.0  0.0 - 0.1 K/uL  COMPREHENSIVE METABOLIC PANEL      Result Value Ref Range   Sodium 138  137 - 147 mEq/L   Potassium 4.1  3.7 - 5.3 mEq/L   Chloride 99  96 - 112 mEq/L   CO2 25  19 - 32 mEq/L   Glucose, Bld 382 (*) 70 - 99 mg/dL   BUN 9  6 - 23 mg/dL   Creatinine, Ser 4.090.47 (*) 0.50 - 1.10 mg/dL   Calcium 8.7  8.4 - 81.110.5 mg/dL   Total Protein 6.6  6.0 - 8.3 g/dL   Albumin 3.1 (*) 3.5 - 5.2 g/dL   AST 18  0 - 37 U/L   ALT 20  0 - 35 U/L   Alkaline Phosphatase 77  39 - 117 U/L   Total Bilirubin 0.4  0.3 - 1.2 mg/dL   GFR calc non Af Amer >90  >90 mL/min   GFR calc Af Amer >90  >90 mL/min   Anion gap 14  5 - 15  LIPASE, BLOOD      Result Value Ref Range   Lipase 28  11 - 59 U/L  URINE MICROSCOPIC-ADD ON      Result Value Ref Range   Squamous Epithelial / LPF FEW (*) RARE   WBC, UA 3-6  <3 WBC/hpf   RBC / HPF 7-10  <3 RBC/hpf   Bacteria, UA RARE  RARE  POC URINE PREG, ED      Result Value Ref Range   Preg Test, Ur NEGATIVE   NEGATIVE   [place] Abdomen Complete  07/02/2014   CLINICAL DATA:  Right upper quadrant pain.  EXAM: ULTRASOUND ABDOMEN COMPLETE  COMPARISON:  None.  FINDINGS: Gallbladder: No gallstones or wall thickening visualized. No sonographic Murphy sign noted.  Common bile duct: Diameter: 3.7 mm, normal.  Liver: No focal lesion identified. Within normal limits in parenchymal echogenicity.  IVC: Normal.  Pancreas: Normal.  Spleen: Normal.  9.7 cm in length.  Right Kidney: Length: 13.3 cm. Slight prominence of the calices and infundibula. The renal pelvis is not dilated.  Left Kidney: Length: 13.5 cm. Echogenicity within normal limits. No mass or hydronephrosis visualized.  Abdominal aorta: 2.3 cm maximum diameter, normal.  Other findings: None.  IMPRESSION: Slight prominence of the calices and infundibula of the right kidney. Does the patient have hematuria? No appreciable mass or stones.  Otherwise, normal exam.   Electronically Signed   By: Geanie CooleyJim  Maxwell M.D.   On: 07/02/2014 09:47   Ct Abdomen Pelvis W Contrast  07/04/2014   CLINICAL DATA:  Right-sided pain  EXAM: CT ABDOMEN AND PELVIS WITH CONTRAST  TECHNIQUE: Multidetector CT imaging of the abdomen and pelvis was performed using the standard protocol following bolus administration of intravenous contrast.  CONTRAST:  100mL OMNIPAQUE IOHEXOL 300  MG/ML  SOLN  COMPARISON:  Ultrasound 07/02/2014  FINDINGS: Lung bases are clear.  Liver gallbladder and bile ducts are normal. Pancreas and spleen are normal.  Kidneys are normal. No renal obstruction or mass. No renal calculi. No ureteral or bladder stone. Mild fullness of the right renal collecting system ultrasound is not identified on CT.  Normal bowel. No bowel obstruction or thickening. Appendix normal. No free fluid.  Uterus is normal. 2.4 cm adnexal cyst bilaterally. Left adnexal cyst appears complex and may be hemorrhagic.  Rim enhancing fluid collection in the anterior midline subcutaneous fat measures 26 x 35 mm and  may be related to prior C-section. This could be a postop seroma or abscess. Correlate with any symptoms of infection in this area.  IMPRESSION: Negative for renal calculi  Normal appendix  Bilateral adnexal cyst.  Abdominal subcutaneous fluid collection may be related to seroma from prior C-section. Abscess could also have this appearance.   Electronically Signed   By: Marlan Palau M.D.   On: 07/04/2014 10:37      EKG Interpretation None      MDM   Final diagnoses:  Abdominal pain, unspecified abdominal location  Cysts of both ovaries    Patient with right sided abdominal pain.  Ongoing for the past several days.  Prior US and workup from 2 days ago was negative. Will check labs and treat pain.  Will recheck CT scan.  9:33 AM Discussed the patient with Dr. Hyacinth Meeker.  11:47 AM CT scan shows bilateral adnexal cysts.  This could be contributing to the pain.  Additional finding of fluid collection on CT, but highly doubt abscess.  No fevers.  Last C-section was in 2007.  Seen by and discussed with Dr. Hyacinth Meeker, who recommends DC to home with OBGYN follow-up.  I discussed this with the patient, who understands and agrees with the plan.  She will follow-up as directed.  Will give NSAIDs for pain.  Patient is stable and ready for discharge.  Return precautions given.  Roxy Horseman, PA-C 07/04/14 1455

## 2014-07-04 NOTE — ED Provider Notes (Signed)
Medical screening examination/treatment/procedure(s) were conducted as a shared visit with non-physician practitioner(s) and myself.  I personally evaluated the patient during the encounter  Please see my separate respective documentation pertaining to this patient encounter   Vida RollerBrian D Raffaela Ladley, MD 07/04/14 2136

## 2014-07-04 NOTE — ED Notes (Signed)
Pt reports unable to obtain urine sample at this time. Pt currently drinking ct contrast.

## 2014-07-04 NOTE — ED Notes (Signed)
Patient transported to CT 

## 2014-07-04 NOTE — ED Notes (Signed)
[facility]. Now having increase in pain that feels like "ripping" pain to right upper abd and radiates around to her side and back. Decreased appetite but denies vomiting, diarrhea or urinary symptoms. Pain increases with movement.

## 2014-07-04 NOTE — Discharge Planning (Signed)
[facility] Specialist  Spoke to patient regarding primary care resources and establishing care with a provider. [place] in [place] at this time. Resource guide and my contact information provided for any future questions or concerns.

## 2014-07-04 NOTE — Discharge Instructions (Signed)
You need to have a follow-up appointment with OBGYN.  Please call your OBGYN for an appointment or you may call the number listed.  Abdominal Pain Many things can cause abdominal pain. Usually, abdominal pain is not caused by a disease and will improve without treatment. It can often be observed and treated at home. Your health care provider will do a physical exam and possibly order blood tests and X-rays to help determine the seriousness of your pain. However, in many cases, more time must pass before a clear cause of the pain can be found. Before that point, your health care provider may not know if you need more testing or further treatment. HOME CARE INSTRUCTIONS  Monitor your abdominal pain for any changes. The following actions may help to alleviate any discomfort you are experiencing:  Only take over-the-counter or prescription medicines as directed by your health care provider.  Do not take laxatives unless directed to do so by your health care provider.  Try a clear liquid diet (broth, tea, or water) as directed by your health care provider. Slowly move to a bland diet as tolerated. SEEK MEDICAL CARE IF:  You have unexplained abdominal pain.  You have abdominal pain associated with nausea or diarrhea.  You have pain when you urinate or have a bowel movement.  You experience abdominal pain that wakes you in the night.  You have abdominal pain that is worsened or improved by eating food.  You have abdominal pain that is worsened with eating fatty foods.  You have a fever. SEEK IMMEDIATE MEDICAL CARE IF:   Your pain does not go away within 2 hours.  You keep throwing up (vomiting).  Your pain is felt only in portions of the abdomen, such as the right side or the left lower portion of the abdomen.  You pass bloody or black tarry stools. MAKE SURE YOU:  Understand these instructions.   Will watch your condition.   Will get help right away if you are not doing well or  get worse.  Document Released: 06/17/2005 Document Revised: 09/12/2013 Document Reviewed: 05/17/2013 [facility] Patient Information 2015 [place], [place]. This information is not intended to replace advice given to you by your health care provider. Make sure you discuss any questions you have with your health care provider.

## 2014-07-04 NOTE — ED Provider Notes (Signed)
The patient with right-sided abdominal tenderness, persistent, not associated with fevers or vomiting, on exam the patient has reproducible tenderness to palpation in the right lower quadrant, right upper abdomen as well. Normal heart and lung sounds.  Medical screening examination/treatment/procedure(s) were conducted as a shared visit with non-physician practitioner(s) and myself.  I personally evaluated the patient during the encounter.  Clinical Impression:   Final diagnoses:  Abdominal pain, unspecified abdominal location  Cysts of both ovaries         Vida RollerBrian D Janesia Joswick, MD 07/04/14 2136

## 2014-07-05 ENCOUNTER — Telehealth (HOSPITAL_COMMUNITY): Payer: Self-pay

## 2014-07-05 NOTE — ED Notes (Signed)
Post ED Visit - Positive Culture Follow-up  Culture report reviewed by antimicrobial stewardship pharmacist: []  Wes Dulaney, Pharm.D., BCPS [x]  Celedonio MiyamotoJeremy Frens, Pharm.D., BCPS []  Georgina PillionElizabeth Martin, Pharm.D., BCPS []  KirkwoodMinh Pham, [place]arm.D., BCPS, AAHIVP []  Estella HuskMichelle Turner, Pharm.D., BCPS, AAHIVP []  Carly Sabat, Pharm.D. []  Enzo BiNathan Batchelder, Pharm.D.  Positive urine culture Treated with cephalexin , organism sensitive to the same and no further patient follow-up is required at this time.  Ashley JacobsFesterman, Wilhelmena Zea C 07/05/2014, 9:49 AM

## 2014-07-18 ENCOUNTER — Emergency Department (HOSPITAL_COMMUNITY)
Admission: EM | Admit: 2014-07-18 | Discharge: 2014-07-18 | Disposition: A | Discharge status: Home / Self Care | Payer: Self-pay | Attending: Emergency Medicine | Admitting: Emergency Medicine

## 2014-07-18 ENCOUNTER — Encounter (HOSPITAL_COMMUNITY): Payer: Self-pay | Admitting: Emergency Medicine

## 2014-07-18 DIAGNOSIS — Z79899 Other long term (current) drug therapy: Secondary | ICD-10-CM | POA: Insufficient documentation

## 2014-07-18 DIAGNOSIS — Z862 Personal history of diseases of the blood and blood-forming organs and certain disorders involving the immune mechanism: Secondary | ICD-10-CM | POA: Insufficient documentation

## 2014-07-18 DIAGNOSIS — Z791 Long term (current) use of non-steroidal anti-inflammatories (NSAID): Secondary | ICD-10-CM | POA: Insufficient documentation

## 2014-07-18 DIAGNOSIS — E1140 Type 2 diabetes mellitus with diabetic neuropathy, unspecified: Secondary | ICD-10-CM | POA: Insufficient documentation

## 2014-07-18 DIAGNOSIS — M546 Pain in thoracic spine: Secondary | ICD-10-CM | POA: Insufficient documentation

## 2014-07-18 DIAGNOSIS — F329 Major depressive disorder, single episode, unspecified: Secondary | ICD-10-CM | POA: Insufficient documentation

## 2014-07-18 DIAGNOSIS — Z8669 Personal history of other diseases of the nervous system and sense organs: Secondary | ICD-10-CM | POA: Insufficient documentation

## 2014-07-18 DIAGNOSIS — Z794 Long term (current) use of insulin: Secondary | ICD-10-CM | POA: Insufficient documentation

## 2014-07-18 MED ORDER — CYCLOBENZAPRINE HCL 10 MG PO TABS: 10.0000 mg | ORAL_TABLET | Freq: Two times a day (BID) | ORAL | Status: DC | PRN

## 2014-07-18 MED ORDER — MELOXICAM 15 MG PO TABS: 15.0000 mg | ORAL_TABLET | Freq: Every day | ORAL | Status: DC

## 2014-07-18 NOTE — ED Provider Notes (Signed)
CSN: [id number]     Arrival date & time 07/18/14  [id number] History  This chart was scribed for non-physician practitioner, Emilia BeckKaitlyn Barbarann Kelly, PA-C working with Doug SouSam Jacubowitz, MD by Greggory StallionKayla Andersen, ED scribe. This patient was seen in room TR06C/TR06C and the patient's care was started at 9:18 AM.   Chief Complaint  Patient presents with  . Back Pain   The history is provided by the patient. No language interpreter was used.   HPI Comments: [name] is a 33 y.o. female who presents to the Emergency Department complaining of mid to lower back pain that radiates into bilateral legs that started 4 days ago. Pain worsened 3 days ago and describes it as a pressure and stabbing pain. Denies fall or injury. Pt has taken 800 mg ibuprofen, naproxen, and tramadol and used a heating pad with no relief. Denies abdominal pain, bowel or bladder incontinence.    Past Medical History  Diagnosis Date  . Diabetes mellitus without complication   . Depression   . High cholesterol   . Anemia   . Carpal tunnel syndrome   . Diabetic neuropathy   . Sickle cell anemia    Past Surgical History  Procedure Laterality Date  . Cesarean section    . Mouth surgery    . Tubal ligation     Family History  Problem Relation Age of Onset  . Diabetes Mother   . Hypertension Mother   . Hyperlipidemia Mother   . Cancer Father    History  Substance Use Topics  . Smoking status: Never Smoker   . Smokeless tobacco: Never Used  . Alcohol Use: No   OB History   Grav Para Term Preterm Abortions TAB SAB Ect Mult Living                 Review of Systems  Gastrointestinal: Negative for abdominal pain.  Genitourinary:       Negative for bowel or bladder incontinence.  Musculoskeletal: Positive for back pain and myalgias.  All other systems reviewed and are negative.  Allergies  Magnesium-containing compounds and Tramadol  Home Medications   Prior to Admission medications   Medication Sig Start Date End  Date Taking? Authorizing Provider  butalbital-acetaminophen-caffeine (FIORICET WITH CODEINE) 50-325-40-30 MG per capsule Take 1 capsule by mouth daily as needed for headache or migraine. No more than 5 days at a time    Historical Provider, MD  cephALEXin (KEFLEX) 500 MG capsule Take 1 capsule (500 mg total) by mouth 3 (three) times daily. 07/02/14   Lyanne CoKevin M Campos, MD  cyclobenzaprine (FLEXERIL) 10 MG tablet Take 10 mg by mouth 2 (two) times daily as needed for muscle spasms.    Historical Provider, MD  furosemide (LASIX) 40 MG tablet Take 40 mg by mouth daily.    Historical Provider, MD  ibuprofen (ADVIL,MOTRIN) 800 MG tablet Take 800 mg by mouth 2 (two) times daily as needed for pain.    Historical Provider, MD  ibuprofen (ADVIL,MOTRIN) 800 MG tablet Take 1 tablet (800 mg total) by mouth 3 (three) times daily. 07/04/14   Roxy Horsemanobert Browning, PA-C  insulin aspart protamine- aspart (NOVOLOG MIX 70/30) (70-30) 100 UNIT/ML injection Inject 25-35 Units into the skin 2 (two) times daily with a meal. Takes 35 units in the morning and 25 units in the evening    Historical Provider, MD  metFORMIN (GLUCOPHAGE) 500 MG tablet Take 500 mg by mouth 2 (two) times daily with a meal.    Historical Provider, MD  naproxen (NAPROSYN) 500 MG tablet Take 1,000-1,500 mg by mouth 2 (two) times daily with a meal.    Historical Provider, MD  ondansetron (ZOFRAN ODT) 8 MG disintegrating tablet Take 1 tablet (8 mg total) by mouth every 8 (eight) hours as needed for nausea or vomiting. 07/02/14   Lyanne CoKevin M Campos, MD  oxyCODONE-acetaminophen (PERCOCET/ROXICET) 5-325 MG per tablet Take 1 tablet by mouth every 4 (four) hours as needed for severe pain. 07/02/14   Lyanne CoKevin M Campos, MD  predniSONE (DELTASONE) 10 MG tablet Take 10 mg by mouth daily as needed (for tightness in chest).     Historical Provider, MD  traMADol (ULTRAM) 50 MG tablet Take 50 mg by mouth 2 (two) times daily as needed for moderate pain.    Historical Provider, MD   traZODone (DESYREL) 150 MG tablet Take 450 mg by mouth at bedtime as needed for sleep.    Historical Provider, MD  vitamin B-12 (CYANOCOBALAMIN) 50 MCG tablet Take 50 mcg by mouth 2 (two) times daily.    Historical Provider, MD   BP 115/74  Pulse 76  Temp(Src) 98.1 F (36.7 C) (Oral)  Resp 22  Ht 5\' 3"  (1.6 m)  Wt 222 lb (100.699 kg)  BMI 39.34 kg/m2  SpO2 100%  LMP 06/22/2014  Physical Exam  Nursing note and vitals reviewed. Constitutional: She is oriented to person, place, and time. She appears well-developed and well-nourished. No distress.  HENT:  Head: Normocephalic and atraumatic.  Eyes: Conjunctivae and EOM are normal.  Neck: Neck supple. No tracheal deviation present.  Cardiovascular: Normal rate, regular rhythm and normal heart sounds.   Pulmonary/Chest: Effort normal and breath sounds normal. No respiratory distress. She has no wheezes. She has no rhonchi. She has no rales.  Musculoskeletal: Normal range of motion.  Paraspinal thoracic tenderness to palpation bilaterally. No midline spine tenderness.  Neurological: She is alert and oriented to person, place, and time.  Extremity strength and sensation equal and intact.   Skin: Skin is warm and dry.  Psychiatric: She has a normal mood and affect. Her behavior is normal.    ED Course  Procedures (including critical care time)  DIAGNOSTIC STUDIES: Oxygen Saturation is 100% on RA, normal by my interpretation.    COORDINATION OF CARE: 9:19 AM-Discussed treatment plan which includes pain medication and a muscle relaxer with pt at bedside and pt agreed to plan.   Labs Review Labs Reviewed - No data to display  Imaging Review No results found.   EKG Interpretation None      MDM   Final diagnoses:  Thoracic back pain, unspecified back pain laterality    9:24 AM Patient likely muscular. Patient will have flexeril and mobic for pain. No bladder/bowel incontinence or saddle paresthesias. Vitals stable and  patient afebrile.   I personally performed the services described in this documentation, which was scribed in my presence. The recorded information has been reviewed and is accurate.  Emilia BeckKaitlyn Sol Odor, [place] JerseyPA-C 07/18/14 [phone]

## 2014-07-18 NOTE — ED Notes (Signed)
Patient states back pain since Saturday with radiation to both legs.   Patient denies any incontinence or urinary symptoms.

## 2014-07-18 NOTE — Discharge Instructions (Signed)
Take mobic as needed for pain. Take flexeril as needed for muscle spasm. You may take these medications together. Refer to attached documents for more information.  °

## 2014-07-20 NOTE — ED Provider Notes (Signed)
Medical screening examination/treatment/procedure(s) were performed by non-physician practitioner and as supervising physician I was immediately available for consultation/collaboration.   EKG Interpretation None       Doug SouSam Khing Belcher, MD 07/20/14 1544

## 2014-08-24 ENCOUNTER — Other Ambulatory Visit: Payer: Self-pay

## 2014-08-24 ENCOUNTER — Encounter (HOSPITAL_COMMUNITY): Payer: Self-pay | Admitting: Emergency Medicine

## 2014-08-24 ENCOUNTER — Emergency Department (HOSPITAL_COMMUNITY)
Admission: EM | Admit: 2014-08-24 | Discharge: 2014-08-25 | Disposition: A | Discharge status: Home / Self Care | Payer: Self-pay | Attending: Emergency Medicine | Admitting: Emergency Medicine

## 2014-08-24 DIAGNOSIS — D649 Anemia, unspecified: Secondary | ICD-10-CM | POA: Insufficient documentation

## 2014-08-24 DIAGNOSIS — Z794 Long term (current) use of insulin: Secondary | ICD-10-CM | POA: Insufficient documentation

## 2014-08-24 DIAGNOSIS — F329 Major depressive disorder, single episode, unspecified: Secondary | ICD-10-CM | POA: Insufficient documentation

## 2014-08-24 DIAGNOSIS — Z791 Long term (current) use of non-steroidal anti-inflammatories (NSAID): Secondary | ICD-10-CM | POA: Insufficient documentation

## 2014-08-24 DIAGNOSIS — R1011 Right upper quadrant pain: Secondary | ICD-10-CM | POA: Insufficient documentation

## 2014-08-24 DIAGNOSIS — Z79899 Other long term (current) drug therapy: Secondary | ICD-10-CM | POA: Insufficient documentation

## 2014-08-24 DIAGNOSIS — Z3202 Encounter for pregnancy test, result negative: Secondary | ICD-10-CM | POA: Insufficient documentation

## 2014-08-24 DIAGNOSIS — Z8669 Personal history of other diseases of the nervous system and sense organs: Secondary | ICD-10-CM | POA: Insufficient documentation

## 2014-08-24 DIAGNOSIS — R109 Unspecified abdominal pain: Secondary | ICD-10-CM

## 2014-08-24 DIAGNOSIS — R1013 Epigastric pain: Secondary | ICD-10-CM | POA: Insufficient documentation

## 2014-08-24 DIAGNOSIS — E1065 Type 1 diabetes mellitus with hyperglycemia: Secondary | ICD-10-CM | POA: Insufficient documentation

## 2014-08-24 LAB — COMPREHENSIVE METABOLIC PANEL
ALK PHOS: 75 U/L (ref 39–117)
ALT: 23 U/L (ref 0–35)
AST: 15 U/L (ref 0–37)
Albumin: 3.2 g/dL — ABNORMAL LOW (ref 3.5–5.2)
Anion gap: 14 (ref 5–15)
BUN: 13 mg/dL (ref 6–23)
CO2: 25 mEq/L (ref 19–32)
Calcium: 9 mg/dL (ref 8.4–10.5)
Chloride: 92 mEq/L — ABNORMAL LOW (ref 96–112)
Creatinine, Ser: 0.56 mg/dL (ref 0.50–1.10)
GFR calc Af Amer: >90 mL/min (ref >90)
GFR calc non Af Amer: >90 mL/min (ref >90)
GLUCOSE: 560 mg/dL — AB (ref 70–99)
POTASSIUM: 3.8 meq/L (ref 3.7–5.3)
SODIUM: 131 meq/L — AB (ref 137–147)
TOTAL PROTEIN: 6.6 g/dL (ref 6.0–8.3)
Total Bilirubin: 0.4 mg/dL (ref 0.3–1.2)

## 2014-08-24 LAB — CBC WITH DIFFERENTIAL/PLATELET
BASOS PCT: 1 % (ref 0–1)
Basophils Absolute: 0.1 10*3/uL (ref 0.0–0.1)
Eosinophils Absolute: 0.2 10*3/uL (ref 0.0–0.7)
Eosinophils Relative: 3 % (ref 0–5)
HCT: 40 % (ref 36.0–46.0)
HEMOGLOBIN: 14.8 g/dL (ref 12.0–15.0)
LYMPHS PCT: 38 % (ref 12–46)
Lymphs Abs: 2.9 10*3/uL (ref 0.7–4.0)
MCH: 30.4 pg (ref 26.0–34.0)
MCHC: 37 g/dL — AB (ref 30.0–36.0)
MCV: 82.1 fL (ref 78.0–100.0)
MONO ABS: 0.5 10*3/uL (ref 0.1–1.0)
MONOS PCT: 6 % (ref 3–12)
NEUTROS ABS: 4 10*3/uL (ref 1.7–7.7)
NEUTROS PCT: 52 % (ref 43–77)
Platelets: 275 10*3/uL (ref 150–400)
RBC: 4.87 MIL/uL (ref 3.87–5.11)
RDW: 12.5 % (ref 11.5–15.5)
WBC: 7.7 10*3/uL (ref 4.0–10.5)

## 2014-08-24 LAB — URINALYSIS, ROUTINE W REFLEX MICROSCOPIC
Bilirubin Urine: NEGATIVE
Glucose, UA: >1000 mg/dL — AB
Ketones, ur: NEGATIVE mg/dL
LEUKOCYTES UA: NEGATIVE
Nitrite: NEGATIVE
PROTEIN: NEGATIVE mg/dL
SPECIFIC GRAVITY, URINE: 1.041 — AB (ref 1.005–1.030)
Urobilinogen, UA: 1 mg/dL (ref 0.0–1.0)
pH: 6 (ref 5.0–8.0)

## 2014-08-24 LAB — LIPASE, BLOOD: Lipase: 32 U/L (ref 11–59)

## 2014-08-24 LAB — ETHANOL: Alcohol, Ethyl (B): <11 mg/dL (ref 0–11)

## 2014-08-24 LAB — PREGNANCY, URINE: Preg Test, Ur: NEGATIVE

## 2014-08-24 LAB — URINE MICROSCOPIC-ADD ON

## 2014-08-24 LAB — TROPONIN I

## 2014-08-24 MED ORDER — INSULIN ASPART PROT & ASPART (70-30 MIX) 100 UNIT/ML ~~LOC~~ SUSP
25.0000 [IU] | Freq: Once | SUBCUTANEOUS | Status: AC
  Administered 2014-08-25: 25 [IU] via SUBCUTANEOUS
  Filled 2014-08-24: qty 10

## 2014-08-24 MED ORDER — PANTOPRAZOLE SODIUM 40 MG IV SOLR
40.0000 mg | Freq: Once | INTRAVENOUS | Status: AC
  Administered 2014-08-24: 40 mg via INTRAVENOUS

## 2014-08-24 MED ORDER — SODIUM CHLORIDE 0.9 % IV SOLN
1000.0000 mL | INTRAVENOUS | Status: DC
  Administered 2014-08-25: 1000 mL via INTRAVENOUS

## 2014-08-24 MED ORDER — SODIUM CHLORIDE 0.9 % IV SOLN
1000.0000 mL | Freq: Once | INTRAVENOUS | Status: AC
  Administered 2014-08-25: 1000 mL via INTRAVENOUS

## 2014-08-24 NOTE — ED Notes (Signed)
Pt arrived to the ED with a complaint of upper abdominal pain.  Pt states that the pain is a burning sensation as well as pt is complaining of shortness of breath.  Pt has also had a loss of appetite.

## 2014-08-24 NOTE — ED Provider Notes (Signed)
CSN: [id number]     Arrival date & time 08/24/14  2104 History   First MD Initiated Contact with Patient 08/24/14 2207     Chief Complaint  Patient presents with  . Abdominal Pain  . Shortness of Breath     (Consider location/radiation/quality/duration/timing/severity/associated sxs/prior Treatment) HPI  The patient poor she's had some central upper abdominal pain for several days now. She was this both burning and sharp in quality. It somewhat central in her upper abdomen but also moves across the left than the right. The patient where she's felt nauseated but she's had no vomiting. She reports she has had decreased appetite. The patient denies any diarrhea or constipation. No associated fever. She reports that it rather hurts when she breathes and makes her feel confirmed breath. She has not had a cough or sputum production. She has had similar pain in the past without a specific cause. The patient has a prior ultrasound and CT scan without abnormal findings.  Past Medical History  Diagnosis Date  . Diabetes mellitus without complication   . Depression   . High cholesterol   . Anemia   . Carpal tunnel syndrome   . Diabetic neuropathy   . Sickle cell anemia    Past Surgical History  Procedure Laterality Date  . Cesarean section    . Mouth surgery    . Tubal ligation     Family History  Problem Relation Age of Onset  . Diabetes Mother   . Hypertension Mother   . Hyperlipidemia Mother   . Cancer Father    History  Substance Use Topics  . Smoking status: Never Smoker   . Smokeless tobacco: Never Used  . Alcohol Use: No   OB History    No data available     Review of Systems 10 Systems reviewed and are negative for acute change except as noted in the HPI.    Allergies  Magnesium-containing compounds and Tramadol  Home Medications   Prior to Admission medications   Medication Sig Start Date End Date Taking? Authorizing Provider  cyclobenzaprine (FLEXERIL) 10 MG  tablet Take 1 tablet (10 mg total) by mouth 2 (two) times daily as needed for muscle spasms. 07/18/14  Yes Kaitlyn Szekalski, PA-C  ibuprofen (ADVIL,MOTRIN) 800 MG tablet Take 1 tablet (800 mg total) by mouth 3 (three) times daily. Patient taking differently: Take 800 mg by mouth every 8 (eight) hours as needed for moderate pain.  07/04/14  Yes Roxy Horsemanobert Browning, PA-C  insulin aspart protamine- aspart (NOVOLOG MIX 70/30) (70-30) 100 UNIT/ML injection Inject 25-35 Units into the skin 2 (two) times daily with a meal. Takes 35 units in the morning and 25 units in the evening   Yes Historical Provider, MD  metFORMIN (GLUCOPHAGE) 500 MG tablet Take 500 mg by mouth 2 (two) times daily with a meal.   Yes Historical Provider, MD  naproxen (NAPROSYN) 500 MG tablet Take 500 mg by mouth 2 (two) times daily with a meal.    Yes Historical Provider, MD  butalbital-acetaminophen-caffeine (FIORICET WITH CODEINE) 50-325-40-30 MG per capsule Take 1 capsule by mouth daily as needed for headache or migraine. No more than 5 days at a time    Historical Provider, MD  cyclobenzaprine (FLEXERIL) 10 MG tablet Take 10 mg by mouth 2 (two) times daily as needed for muscle spasms.    Historical Provider, MD  furosemide (LASIX) 40 MG tablet Take 40 mg by mouth daily.    Historical Provider, MD  ibuprofen (  ADVIL,MOTRIN) 800 MG tablet Take 800 mg by mouth 2 (two) times daily as needed for pain.    Historical Provider, MD  omeprazole (PRILOSEC) 20 MG capsule Take 1 capsule (20 mg total) by mouth daily. 08/25/14   Arby BarretteMarcy Harvest Deist, MD  ondansetron (ZOFRAN ODT) 4 MG disintegrating tablet Take 1 tablet (4 mg total) by mouth every 4 (four) hours as needed for nausea or vomiting. 08/25/14   Arby BarretteMarcy Pollyann Roa, MD  predniSONE (DELTASONE) 10 MG tablet Take 10 mg by mouth daily as needed (for tightness in chest).     Historical Provider, MD  traMADol (ULTRAM) 50 MG tablet Take 50 mg by mouth 2 (two) times daily as needed for moderate pain.    Historical  Provider, MD  vitamin B-12 (CYANOCOBALAMIN) 50 MCG tablet Take 50 mcg by mouth 2 (two) times daily.    Historical Provider, MD   BP 118/84 mmHg  Pulse 83  Temp(Src) 97.5 F (36.4 C) (Oral)  Resp 20  SpO2 97%  LMP 07/18/2014 Physical Exam  Constitutional: She is oriented to person, place, and time. She appears well-developed and well-nourished.  HENT:  Head: Normocephalic and atraumatic.  Eyes: EOM are normal. Pupils are equal, round, and reactive to light.  Neck: Neck supple.  Cardiovascular: Normal rate, regular rhythm, normal heart sounds and intact distal pulses.   Pulmonary/Chest: Effort normal and breath sounds normal.  Abdominal: Soft. Bowel sounds are normal. She exhibits no distension. There is tenderness (Patient has moderate tenderness in the epigastrium. This also extends is some tenderness to the right and left upper quadrant. No guarding no rebound. No lower abdominal pain.).  Musculoskeletal: Normal range of motion. She exhibits no edema.  Neurological: She is alert and oriented to person, place, and time. She has normal strength. Coordination normal. GCS eye subscore is 4. GCS verbal subscore is 5. GCS motor subscore is 6.  Skin: Skin is warm, dry and intact.  Psychiatric: She has a normal mood and affect.    ED Course  Procedures (including critical care time) Labs Review Labs Reviewed  COMPREHENSIVE METABOLIC PANEL - Abnormal; Notable for the following:    Sodium 131 (*)    Chloride 92 (*)    Glucose, Bld 560 (*)    Albumin 3.2 (*)    All other components within normal limits  CBC WITH DIFFERENTIAL - Abnormal; Notable for the following:    MCHC 37.0 (*)    All other components within normal limits  URINALYSIS, ROUTINE W REFLEX MICROSCOPIC - Abnormal; Notable for the following:    Specific Gravity, Urine 1.041 (*)    Glucose, UA >1000 (*)    Hgb urine dipstick LARGE (*)    All other components within normal limits  BLOOD GAS, VENOUS - Abnormal; Notable for  the following:    pH, Ven 7.368 (*)    pCO2, Ven 41.1 (*)    All other components within normal limits  CBG MONITORING, ED - Abnormal; Notable for the following:    Glucose-Capillary 324 (*)    All other components within normal limits  CBG MONITORING, ED - Abnormal; Notable for the following:    Glucose-Capillary 316 (*)    All other components within normal limits  ETHANOL  LIPASE, BLOOD  TROPONIN I  PREGNANCY, URINE  URINE MICROSCOPIC-ADD ON  LIPASE, BLOOD  LACTIC ACID, PLASMA    Imaging Review No results found.   EKG Interpretation None     Consult: Hospitalist Dr. Eliane DecreePatel's consults it for diabetic with abdominal  pain and hyperglycemia with blood sugar of 560. MDM   Final diagnoses:  Epigastric pain  Hyperglycemia due to type 1 diabetes mellitus   The patient has had a prior negative workup for abdominal pain. At this point I do not find her abdominal examination to be surgical in nature. Main concern is for diabetic with significant hyperglycemia, nausea and abdominal pain. Hospitalist was consult is for further management. Dr. Allena Katz advised he wished to pursue further treatment in the emergency department and would reassess for response to treatment and final management plan.    Arby Barrette, MD 08/28/14 [phone]

## 2014-08-25 ENCOUNTER — Emergency Department (HOSPITAL_COMMUNITY): Payer: Self-pay

## 2014-08-25 DIAGNOSIS — E118 Type 2 diabetes mellitus with unspecified complications: Secondary | ICD-10-CM

## 2014-08-25 DIAGNOSIS — R1084 Generalized abdominal pain: Secondary | ICD-10-CM

## 2014-08-25 DIAGNOSIS — R739 Hyperglycemia, unspecified: Secondary | ICD-10-CM

## 2014-08-25 DIAGNOSIS — R110 Nausea: Secondary | ICD-10-CM

## 2014-08-25 LAB — BLOOD GAS, VENOUS
Acid-base deficit: 1.6 mmol/L (ref 0.0–2.0)
BICARBONATE: 23.1 meq/L (ref 20.0–24.0)
FIO2: 0.21 %
O2 SAT: 76.8 %
PH VEN: 7.368 — AB (ref 7.250–7.300)
PO2 VEN: 44.6 mmHg (ref 30.0–45.0)
Patient temperature: 98.6
TCO2: 21 mmol/L (ref 0–100)
pCO2, Ven: 41.1 mmHg — ABNORMAL LOW (ref 45.0–50.0)

## 2014-08-25 LAB — CBG MONITORING, ED
Glucose-Capillary: 324 mg/dL — ABNORMAL HIGH (ref 70–99)
Glucose-Capillary: 316 mg/dL — ABNORMAL HIGH (ref 70–99)

## 2014-08-25 LAB — LACTIC ACID, PLASMA: Lactic Acid, Venous: 2.2 mmol/L (ref 0.5–2.2)

## 2014-08-25 LAB — LIPASE, BLOOD: Lipase: 33 U/L (ref 11–59)

## 2014-08-25 MED ORDER — ONDANSETRON HCL 4 MG/2ML IJ SOLN
4.0000 mg | Freq: Once | INTRAMUSCULAR | Status: AC
  Administered 2014-08-25: 4 mg via INTRAVENOUS

## 2014-08-25 MED ORDER — OMEPRAZOLE 20 MG PO CPDR: 20.0000 mg | DELAYED_RELEASE_CAPSULE | Freq: Every day | ORAL | Status: DC

## 2014-08-25 MED ORDER — ONDANSETRON 4 MG PO TBDP: 4.0000 mg | ORAL_TABLET | ORAL | Status: DC | PRN

## 2014-08-25 NOTE — Consult Note (Signed)
[facility] Initial Consult Note  Nicole GrillsJessica Longan  WUJ:[id number]  DOB: April 14, 1981  DOA: 08/24/2014 DOS: the patient was seen and examined on 08/25/2014   Referring physician: Dr. Clarice PolePfeifer PCP: PROVIDER NOT IN SYSTEM   Reason for consult: Abdominal pain  HPI: [name] is a 33 y.o. female with Past medical history of diabetes mellitus, anemia, diabetic neuropathy. The patient presented with complaints of abdominal pain that has been ongoing since last 2 days. The pain is located in the upper abdomen and feels like sharp and burning pain. She mentions she takes naproxen twice a day and at least once a day ibuprofen. She mentions she mostly takes it with full stomach. She mentions she has on and off recurrent episodes of heartburn. She started having nausea and felt a sense of bloating. She denies any constipation and has regular daily bowel motions. She has been passing gas. She denies any fever, chills, chest pain, cough, orthopnea, PND, leg swelling, recent travel, recent sick contact. She mentions that she has occasional shortness of breath. She denies any dizziness of focal deficit.  Review of Systems: as mentioned in the history of present illness.  A Comprehensive review of the other systems is negative.  Past Medical History  Diagnosis Date  . Diabetes mellitus without complication   . Depression   . High cholesterol   . Anemia   . Carpal tunnel syndrome   . Diabetic neuropathy   . Sickle cell anemia    Past Surgical History  Procedure Laterality Date  . Cesarean section    . Mouth surgery    . Tubal ligation     Social History:  reports that she has never smoked. She has never used smokeless tobacco. She reports that she does not drink alcohol or use illicit drugs. Patient is coming from home  Allergies  Allergen Reactions  . Magnesium-Containing Compounds Hives  . Tramadol Other (See Comments)    Tremors     Family History  Problem Relation Age of  Onset  . Diabetes Mother   . Hypertension Mother   . Hyperlipidemia Mother   . Cancer Father     Prior to Admission medications   Medication Sig Start Date End Date Taking? Authorizing Provider  cyclobenzaprine (FLEXERIL) 10 MG tablet Take 1 tablet (10 mg total) by mouth 2 (two) times daily as needed for muscle spasms. 07/18/14  Yes Kaitlyn Szekalski, PA-C  ibuprofen (ADVIL,MOTRIN) 800 MG tablet Take 1 tablet (800 mg total) by mouth 3 (three) times daily. Patient taking differently: Take 800 mg by mouth every 8 (eight) hours as needed for moderate pain.  07/04/14  Yes Roxy Horsemanobert Browning, PA-C  insulin aspart protamine- aspart (NOVOLOG MIX 70/30) (70-30) 100 UNIT/ML injection Inject 25-35 Units into the skin 2 (two) times daily with a meal. Takes 35 units in the morning and 25 units in the evening   Yes Historical Provider, MD  metFORMIN (GLUCOPHAGE) 500 MG tablet Take 500 mg by mouth 2 (two) times daily with a meal.   Yes Historical Provider, MD  naproxen (NAPROSYN) 500 MG tablet Take 500 mg by mouth 2 (two) times daily with a meal.    Yes Historical Provider, MD  butalbital-acetaminophen-caffeine (FIORICET WITH CODEINE) 50-325-40-30 MG per capsule Take 1 capsule by mouth daily as needed for headache or migraine. No more than 5 days at a time    Historical Provider, MD  cyclobenzaprine (FLEXERIL) 10 MG tablet Take 10 mg by mouth 2 (two) times daily as needed for  muscle spasms.    Historical Provider, MD  furosemide (LASIX) 40 MG tablet Take 40 mg by mouth daily.    Historical Provider, MD  ibuprofen (ADVIL,MOTRIN) 800 MG tablet Take 800 mg by mouth 2 (two) times daily as needed for pain.    Historical Provider, MD  omeprazole (PRILOSEC) 20 MG capsule Take 1 capsule (20 mg total) by mouth daily. 08/25/14   Arby BarretteMarcy Pfeiffer, MD  ondansetron (ZOFRAN ODT) 4 MG disintegrating tablet Take 1 tablet (4 mg total) by mouth every 4 (four) hours as needed for nausea or vomiting. 08/25/14   Arby BarretteMarcy Pfeiffer, MD   predniSONE (DELTASONE) 10 MG tablet Take 10 mg by mouth daily as needed (for tightness in chest).     Historical Provider, MD  traMADol (ULTRAM) 50 MG tablet Take 50 mg by mouth 2 (two) times daily as needed for moderate pain.    Historical Provider, MD  vitamin B-12 (CYANOCOBALAMIN) 50 MCG tablet Take 50 mcg by mouth 2 (two) times daily.    Historical Provider, MD    Physical Exam: Filed Vitals:   08/24/14 2122 08/24/14 2245 08/24/14 2300 08/25/14 0205  BP: 105/61 109/70 103/64 102/63  Pulse: 92 78 79 76  Temp: 97.8 F (36.6 C)     TempSrc: Oral     Resp: 20 17 13 18   SpO2: 99% 100% 99% 100%    General: Alert, Awake and Oriented to Time, Place and Person. Appear in mild distress Eyes: PERRL ENT: Oral Mucosa clear moist. Neck: no JVD, no Carotid Bruits  Cardiovascular: S1 and S2 Present, no Murmur, Peripheral Pulses Present Respiratory: Bilateral Air entry equal and Decreased, Clear to Auscultation,  no Crackles,no wheezes Abdomen: Bowel Sound Present, Soft and mild diffuse tenderness Skin: no Rash Extremities: no Pedal edema, no calf tenderness Neurologic: Grossly Unremarkable.  Labs:  Basic Metabolic Panel:  Recent Labs Lab 08/24/14 2223  NA 131*  K 3.8  CL 92*  CO2 25  GLUCOSE 560*  BUN 13  CREATININE 0.56  CALCIUM 9.0   Liver Function Tests:  Recent Labs Lab 08/24/14 2223  AST 15  ALT 23  ALKPHOS 75  BILITOT 0.4  PROT 6.6  ALBUMIN 3.2*    Recent Labs Lab 08/24/14 2223  LIPASE 32   No results for input(s): AMMONIA in the last 168 hours. CBC:  Recent Labs Lab 08/24/14 2223  WBC 7.7  NEUTROABS 4.0  HGB 14.8  HCT 40.0  MCV 82.1  PLT 275   Cardiac Enzymes:  Recent Labs Lab 08/24/14 2223  TROPONINI <0.30    BNP (last 3 results) No results for input(s): PROBNP in the last 8760 hours. CBG:  Recent Labs Lab 08/25/14 0153  GLUCAP 324*    Radiological Exams: Dg Abd Acute W/chest  08/25/2014   CLINICAL DATA:  Acute onset of  upper abdominal pain for 1 day, with shortness of breath. Loss of appetite. Initial encounter.  EXAM: ACUTE ABDOMEN SERIES (ABDOMEN 2 VIEW & CHEST 1 VIEW)  COMPARISON:  Chest radiograph from 05/22/2013, and CT of the abdomen and pelvis performed 07/04/2014  FINDINGS: The lungs are well-aerated and clear. There is no evidence of focal opacification, pleural effusion or pneumothorax. The cardiomediastinal silhouette is within normal limits.  The visualized bowel gas pattern is unremarkable. Scattered stool and air are seen within the colon; there is no evidence of small bowel dilatation to suggest obstruction. No free intra-abdominal air is identified on the provided upright view.  No acute osseous abnormalities are seen; the  sacroiliac joints are unremarkable in appearance.  IMPRESSION: 1. Unremarkable bowel gas pattern; no free intra-abdominal air seen. Small to moderate amount of stool noted in the colon. 2. No acute cardiopulmonary process identified.   Electronically Signed   By: Roanna Raider M.D.   On: 08/25/2014 05:04    EKG: Independently reviewed. Normal sinus rhythm nonspecific ST-T wave changes  Assessment/Plan 1. The patient is presenting with complaints of abdominal pain. Workup including serial lipases as well as lactic acid level as well as LFT as well as CBC as well as serum creatinine are unremarkable. Serum troponin x-ray chest abdomen are also unremarkable. Most likely etiology is gastritis due to patient's use of NSAIDs. Patient recommended to start taking omeprazole on an empty stomach first thing in the morning as well as continue using Zofran as needed for nausea. Patient was able to tolerate by mouth challenge in the ER. Patient mentions her pain has significantly improved after use of Protonix. Possible etiology can also include gastroparesis secondary to diabetes. Patient recommended to obtain further workup associated with her PCP.  2. Hyperglycemia. Patient's blood sugar  mildly elevated. Patient does not have any anion gap. ABG does not show any acidosis. It does not show any ketones. Recheck of her sugar shows significant improvement. Recommended patient to stay compliant with her insulin and updated glucometer.  Family Communication: Family was present at the bedside   Thank you very much for involving [place] in care of your patient.  We will sign out the patient will be transferred back to the ER physician for discharge and follow-up care.  Author: Lynden Oxford, MD [facility] Pager: [phone] 08/25/2014 3:48 AM    If 7PM-7AM, please contact night-coverage [email or web address] Password [email or web address]

## 2014-08-25 NOTE — ED Notes (Signed)
Patient transported to X-ray 

## 2014-08-25 NOTE — ED Notes (Signed)
Dr. Patel at bedside 

## 2014-08-25 NOTE — Discharge Instructions (Signed)
Abdominal Pain °Many things can cause abdominal pain. Usually, abdominal pain is not caused by a disease and will improve without treatment. It can often be observed and treated at home. Your health care provider will do a physical exam and possibly order blood tests and X-rays to help determine the seriousness of your pain. However, in many cases, more time must pass before a clear cause of the pain can be found. Before that point, your health care provider may not know if you need more testing or further treatment. °HOME CARE INSTRUCTIONS  °Monitor your abdominal pain for any changes. The following actions may help to alleviate any discomfort you are experiencing: °· Only take over-the-counter or prescription medicines as directed by your health care provider. °· Do not take laxatives unless directed to do so by your health care provider. °· Try a clear liquid diet (broth, tea, or water) as directed by your health care provider. Slowly move to a bland diet as tolerated. °SEEK MEDICAL CARE IF: °· You have unexplained abdominal pain. °· You have abdominal pain associated with nausea or diarrhea. °· You have pain when you urinate or have a bowel movement. °· You experience abdominal pain that wakes you in the night. °· You have abdominal pain that is worsened or improved by eating food. °· You have abdominal pain that is worsened with eating fatty foods. °· You have a fever. °SEEK IMMEDIATE MEDICAL CARE IF:  °· Your pain does not go away within 2 hours. °· You keep throwing up (vomiting). °· Your pain is felt only in portions of the abdomen, such as the right side or the left lower portion of the abdomen. °· You pass bloody or black tarry stools. °MAKE SURE YOU: °· Understand these instructions.   °· Will watch your condition.   °· Will get help right away if you are not doing well or get worse.   °Document Released: 06/17/2005 Document Revised: 09/12/2013 Document Reviewed: 05/17/2013 °ExitCare® Patient Information  ©2015 [employer]. This information is not intended to replace advice given to you by your health care provider. Make sure you discuss any questions you have with your health care provider. ° °Blood Glucose Monitoring °Monitoring your blood glucose (also know as blood sugar) helps you to manage your diabetes. It also helps you and your health care provider monitor your diabetes and determine how well your treatment plan is working. °WHY SHOULD YOU MONITOR YOUR BLOOD GLUCOSE? °· It can help you understand how food, exercise, and medicine affect your blood glucose. °· It allows you to know what your blood glucose is at any given moment. You can quickly tell if you are having low blood glucose (hypoglycemia) or high blood glucose (hyperglycemia). °· It can help you and your health care provider know how to adjust your medicines. °· It can help you understand how to manage an illness or adjust medicine for exercise. °WHEN SHOULD YOU TEST? °Your health care provider will help you decide how often you should check your blood glucose. This may depend on the type of diabetes you have, your diabetes control, or the types of medicines you are taking. Be sure to write down all of your blood glucose readings so that this information can be reviewed with your health care provider. See below for examples of testing times that your health care provider may suggest. °Type 1 Diabetes °· Test 4 times a day if you are in good control, using an insulin pump, or perform multiple daily injections. °· If   your diabetes is not well controlled or if you are sick, you may need to monitor more often. °· It is a good idea to also monitor: °¨ Before and after exercise. °¨ Between meals and 2 hours after a meal. °¨ Occasionally between 2:00 a.m. and 3:00 a.m. °Type 2 Diabetes °· It can vary with each person, but generally, if you are on insulin, test 4 times a day. °· If you take medicines by mouth (orally), test 2 times a day. °· If you are on  a controlled diet, test once a day. °· If your diabetes is not well controlled or if you are sick, you may need to monitor more often. °HOW TO MONITOR YOUR BLOOD GLUCOSE °Supplies Needed °· Blood glucose meter. °· Test strips for your meter. Each meter has its own strips. You must use the strips that go with your own meter. °· A pricking needle (lancet). °· A device that holds the lancet (lancing device). °· A journal or log book to write down your results. °Procedure °· Wash your hands with soap and water. Alcohol is not preferred. °· Prick the side of your finger (not the tip) with the lancet. °· Gently milk the finger until a small drop of blood appears. °· Follow the instructions that come with your meter for inserting the test strip, applying blood to the strip, and using your blood glucose meter. °Other Areas to Get Blood for Testing °Some meters allow you to use other areas of your body (other than your finger) to test your blood. These areas are called alternative sites. The most common alternative sites are: °· The forearm. °· The thigh. °· The back area of the lower leg. °· The palm of the hand. °The blood flow in these areas is slower. Therefore, the blood glucose values you get may be delayed, and the numbers are different from what you would get from your fingers. Do not use alternative sites if you think you are having hypoglycemia. Your reading will not be accurate. Always use a finger if you are having hypoglycemia. Also, if you cannot feel your lows (hypoglycemia unawareness), always use your fingers for your blood glucose checks. °ADDITIONAL TIPS FOR GLUCOSE MONITORING °· Do not reuse lancets. °· Always carry your supplies with you. °· All blood glucose meters have a 24-hour "hotline" number to call if you have questions or need help. °· Adjust (calibrate) your blood glucose meter with a control solution after finishing a few boxes of strips. °BLOOD GLUCOSE RECORD KEEPING °It is a good idea to keep  a daily record or log of your blood glucose readings. Most glucose meters, if not all, keep your glucose records stored in the meter. Some meters come with the ability to download your records to your home computer. Keeping a record of your blood glucose readings is especially helpful if you are wanting to look for patterns. Make notes to go along with the blood glucose readings because you might forget what happened at that exact time. Keeping good records helps you and your health care provider to work together to achieve good diabetes management.  °Document Released: 09/10/2003 Document Revised: 01/22/2014 Document Reviewed: 01/30/2013 °ExitCare® Patient Information ©2015 [employer]. This information is not intended to replace advice given to you by your health care provider. Make sure you discuss any questions you have with your health care provider. ° °

## 2014-08-25 NOTE — ED Provider Notes (Signed)
Patient signed out to me by Dr. Arby BarretteMarcy Pfeiffer, M.D.  Patient is a 33 year old female who presented to the ER with abdominal pain, nausea, vomiting. Patient workup with labs, imaging and upright abdomen and chest. Patient noted to be hyperglycemic and treated in the ER for her symptoms and hyperglycemia. Patient was signed out to me with plan to consult with the hospitalist who is also following patient's progression in the ER and if patient is not getting any better to admit. If patient's blood sugar continues to decrease, and her symptoms continued to subside she is to be discharged home. Hospitalist is in agreement with this plan as well.  After labs return, Dr. Allena KatzPatel agrees that with normal lab work up and patient's symptoms improving as well as her blood sugar improving, it is reasonable that patient should be discharged to home. Return precautions discussed with patient, and patient encouraged to call or return to the ER should she have any questions or concerns.  BP 118/84 mmHg  Pulse 83  Temp(Src) 97.5 F (36.4 C) (Oral)  Resp 20  SpO2 97%  LMP 07/18/2014  Signed,  Ladona MowJoe Tyanna Hach, PA-C 8:47 AM   Monte FantasiaJoseph W Paxtyn Wisdom, PA-C 08/25/14 [id number]  Ward GivensIva L Knapp, MD 10/09/14 [phone]

## 2014-10-03 ENCOUNTER — Emergency Department (HOSPITAL_COMMUNITY): Payer: 59

## 2014-10-03 ENCOUNTER — Encounter (HOSPITAL_COMMUNITY): Payer: Self-pay | Admitting: Emergency Medicine

## 2014-10-03 ENCOUNTER — Emergency Department (HOSPITAL_COMMUNITY)
Admission: EM | Admit: 2014-10-03 | Discharge: 2014-10-03 | Disposition: A | Discharge status: Home / Self Care | Payer: 59 | Attending: Emergency Medicine | Admitting: Emergency Medicine

## 2014-10-03 DIAGNOSIS — M25511 Pain in right shoulder: Secondary | ICD-10-CM | POA: Diagnosis not present

## 2014-10-03 DIAGNOSIS — E1140 Type 2 diabetes mellitus with diabetic neuropathy, unspecified: Secondary | ICD-10-CM | POA: Diagnosis not present

## 2014-10-03 DIAGNOSIS — Z794 Long term (current) use of insulin: Secondary | ICD-10-CM | POA: Insufficient documentation

## 2014-10-03 DIAGNOSIS — D649 Anemia, unspecified: Secondary | ICD-10-CM | POA: Insufficient documentation

## 2014-10-03 DIAGNOSIS — Z791 Long term (current) use of non-steroidal anti-inflammatories (NSAID): Secondary | ICD-10-CM | POA: Diagnosis not present

## 2014-10-03 DIAGNOSIS — Z79899 Other long term (current) drug therapy: Secondary | ICD-10-CM | POA: Diagnosis not present

## 2014-10-03 DIAGNOSIS — F329 Major depressive disorder, single episode, unspecified: Secondary | ICD-10-CM | POA: Insufficient documentation

## 2014-10-03 MED ORDER — IBUPROFEN 600 MG PO TABS: 600.0000 mg | ORAL_TABLET | Freq: Four times a day (QID) | ORAL | Status: DC | PRN

## 2014-10-03 MED ORDER — IBUPROFEN 800 MG PO TABS
800.0000 mg | ORAL_TABLET | Freq: Once | ORAL | Status: AC
  Administered 2014-10-03: 800 mg via ORAL
  Filled 2014-10-03: qty 1

## 2014-10-03 MED ORDER — CYCLOBENZAPRINE HCL 10 MG PO TABS: 10.0000 mg | ORAL_TABLET | Freq: Two times a day (BID) | ORAL | Status: DC | PRN

## 2014-10-03 NOTE — Discharge Instructions (Signed)

## 2014-10-03 NOTE — ED Notes (Signed)
Pt c/o right shoulder pain x 3 days, states it woke her out of her sleep. Denies injury.

## 2014-10-03 NOTE — ED Provider Notes (Signed)
CSN: [id number]     Arrival date & time 10/03/14  1216 History  This chart was scribed for non-physician practitioner Jinny Sanders, PA-C, working with Samuel Jester, DO by Littie Deeds, ED Scribe. This patient was seen in room WTR7/WTR7 and the patient's care was started at 1:15 PM.      Chief Complaint  Patient presents with  . Shoulder Pain   The history is provided by the patient. No language interpreter was used.   HPI Comments: [name] is a 34 y.o. female who presents to the Emergency Department complaining of gradual onset, sharp, pulling right shoulder pain that started 3 days ago. Patient states the pain is worse with lifting and movement of her shoulder. She notes some relief when not moving her shoulder. She states the pain woke her up from her sleep. 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 Patient states her shoulder will pop out and has been doing so since childhood; she will then have to pop it back in, which causes her pain. She states her shoulder has not popped out recently. She denies hx of neck problems. Patient has allergies to magnesium. Her LNMP was 09/11/14; she denies possibility of pregnancy as she has had tubal ligation.  PCP: in [place]  Past Medical History  Diagnosis Date  . Diabetes mellitus without complication   . Depression   . High cholesterol   . Anemia   . Carpal tunnel syndrome   . Diabetic neuropathy   . Sickle cell anemia    Past Surgical History  Procedure Laterality Date  . Cesarean section    . Mouth surgery    . Tubal ligation     Family History  Problem Relation Age of Onset  . Diabetes Mother   . Hypertension Mother   . Hyperlipidemia Mother   . Cancer Father    History  Substance Use Topics  . Smoking status: Never Smoker   . Smokeless tobacco: Never Used  . Alcohol Use: No   OB History    No data available     Review of Systems  Musculoskeletal: Positive for arthralgias.  Neurological:  Negative for weakness and numbness.      Allergies  Magnesium-containing compounds and Tramadol  Home Medications   Prior to Admission medications   Medication Sig Start Date End Date Taking? Authorizing Provider  butalbital-acetaminophen-caffeine (FIORICET WITH CODEINE) 50-325-40-30 MG per capsule Take 1 capsule by mouth daily as needed for headache or migraine. No more than 5 days at a time    Historical Provider, MD  cyclobenzaprine (FLEXERIL) 10 MG tablet Take 1 tablet (10 mg total) by mouth 2 (two) times daily as needed for muscle spasms. 10/03/14   Monte Fantasia, PA-C  furosemide (LASIX) 40 MG tablet Take 40 mg by mouth daily.    Historical Provider, MD  ibuprofen (ADVIL,MOTRIN) 600 MG tablet Take 1 tablet (600 mg total) by mouth every 6 (six) hours as needed. 10/03/14   Monte Fantasia, PA-C  insulin aspart protamine- aspart (NOVOLOG MIX 70/30) (70-30) 100 UNIT/ML injection Inject 25-35 Units into the skin 2 (two) times daily with a meal. Takes 35 units in the morning and 25 units in the evening    Historical Provider, MD  metFORMIN (GLUCOPHAGE) 500 MG tablet Take 500 mg by mouth 2 (two) times daily with a meal.    Historical Provider, MD  naproxen (NAPROSYN) 500 MG tablet Take 500 mg by mouth 2 (two) times daily with a meal.  Historical Provider, MD  omeprazole (PRILOSEC) 20 MG capsule Take 1 capsule (20 mg total) by mouth daily. 08/25/14   Arby BarretteMarcy Pfeiffer, MD  ondansetron (ZOFRAN ODT) 4 MG disintegrating tablet Take 1 tablet (4 mg total) by mouth every 4 (four) hours as needed for nausea or vomiting. 08/25/14   Arby BarretteMarcy Pfeiffer, MD  predniSONE (DELTASONE) 10 MG tablet Take 10 mg by mouth daily as needed (for tightness in chest).     Historical Provider, MD  traMADol (ULTRAM) 50 MG tablet Take 50 mg by mouth 2 (two) times daily as needed for moderate pain.    Historical Provider, MD  vitamin B-12 (CYANOCOBALAMIN) 50 MCG tablet Take 50 mcg by mouth 2 (two) times daily.    Historical  Provider, MD   BP 121/80 mmHg  Pulse 80  Temp(Src) 97.5 F (36.4 C) (Oral)  Resp 18  SpO2 99%  LMP 09/11/2014 (Exact Date) Physical Exam  Constitutional: She is oriented to person, place, and time. She appears well-developed and well-nourished. No distress.  HENT:  Head: Normocephalic and atraumatic.  Mouth/Throat: Oropharynx is clear and moist. No oropharyngeal exudate.  Eyes: Pupils are equal, round, and reactive to light.  Neck: Neck supple.  No C-spine tenderness.  Cardiovascular: Normal rate.   Pulses:      Radial pulses are 2+ on the right side, and 2+ on the left side.  Pulmonary/Chest: Effort normal.  Musculoskeletal: She exhibits tenderness. She exhibits no edema.  AC joint tenderness. TTP of right upper trapezius muscle. 180 degrees of abduction and flexion, painful arc. Positive empty can test. Motor strength 5 out of 5 at shoulder, elbow, wrist. Radial pulse 2+. Distal sensation intact.  Neurological: She is alert and oriented to person, place, and time. No cranial nerve deficit.  5/5 motor strength elbow, shoulder, and wrist.  Skin: Skin is warm and dry. No rash noted.  Psychiatric: She has a normal mood and affect. Her behavior is normal.  Nursing note and vitals reviewed.   ED Course  Procedures  DIAGNOSTIC STUDIES: Oxygen Saturation is 98% on room air, normal by my interpretation.    COORDINATION OF CARE: 1:23 PM-Discussed treatment plan which includes imaging, ibuprofen, muscle relaxer and follow-up with orthopedics with pt at bedside and pt agreed to plan.    Labs Review Labs Reviewed - No data to display  Imaging Review Dg Shoulder Right  10/03/2014   CLINICAL DATA:  Posterior right shoulder pain for 3 days. No known injury. Initial encounter.  EXAM: RIGHT SHOULDER - 2+ VIEW  COMPARISON:  None.  FINDINGS: The humerus is located knee acromioclavicular joint is intact. No degenerative change is identified. There is no fracture or focal bony lesion. Imaged  right lung and ribs appear normal.  IMPRESSION: Negative examination.   Electronically Signed   By: Drusilla Kannerhomas  Dalessio M.D.   On: 10/03/2014 14:30     EKG Interpretation None      MDM   Final diagnoses:  Right shoulder pain   Radiographs unremarkable for acute pathology. PE shows no instability, tenderness, or deformity of acromioclavicular and sternoclavicular joints, the cervical spine, glenohumeral joint, coracoid process, acromion, or scapula. Good shoulder strength during empty can test. Good ROM during scratch test. Mild signs of impingement on Neers test, however mild pain noted with painful arc.Marland Kitchen. No shoulder instability during Apprehension test. Patient placed in shoulder sling, encouraged to follow-up with orthopedics. I discussed RICE therapy with patient. I discussed return precautions with patient, and patient verbalizes understanding and agreement of  this plan. I encouraged patient to call or return to the ER if any worsening of symptoms or should she have any questions or concerns.  BP 121/80 mmHg  Pulse 80  Temp(Src) 97.5 F (36.4 C) (Oral)  Resp 18  SpO2 99%  LMP 09/11/2014 (Exact Date)  Signed,  Ladona Mow, PA-C 10:25 PM    Monte Fantasia, PA-C 10/03/14 2225  Samuel Jester, DO 10/06/14 [phone]

## 2014-11-16 ENCOUNTER — Emergency Department (HOSPITAL_COMMUNITY)
Admission: EM | Admit: 2014-11-16 | Discharge: 2014-11-16 | Disposition: A | Discharge status: Home / Self Care | Payer: 59 | Attending: Emergency Medicine | Admitting: Emergency Medicine

## 2014-11-16 ENCOUNTER — Encounter (HOSPITAL_COMMUNITY): Payer: Self-pay | Admitting: Emergency Medicine

## 2014-11-16 ENCOUNTER — Emergency Department (HOSPITAL_COMMUNITY): Payer: 59

## 2014-11-16 DIAGNOSIS — E119 Type 2 diabetes mellitus without complications: Secondary | ICD-10-CM | POA: Diagnosis not present

## 2014-11-16 DIAGNOSIS — Z7952 Long term (current) use of systemic steroids: Secondary | ICD-10-CM | POA: Insufficient documentation

## 2014-11-16 DIAGNOSIS — F329 Major depressive disorder, single episode, unspecified: Secondary | ICD-10-CM | POA: Insufficient documentation

## 2014-11-16 DIAGNOSIS — Z862 Personal history of diseases of the blood and blood-forming organs and certain disorders involving the immune mechanism: Secondary | ICD-10-CM | POA: Diagnosis not present

## 2014-11-16 DIAGNOSIS — Z9851 Tubal ligation status: Secondary | ICD-10-CM | POA: Diagnosis not present

## 2014-11-16 DIAGNOSIS — R52 Pain, unspecified: Secondary | ICD-10-CM

## 2014-11-16 DIAGNOSIS — Z794 Long term (current) use of insulin: Secondary | ICD-10-CM | POA: Insufficient documentation

## 2014-11-16 DIAGNOSIS — Z8669 Personal history of other diseases of the nervous system and sense organs: Secondary | ICD-10-CM | POA: Insufficient documentation

## 2014-11-16 DIAGNOSIS — Z79899 Other long term (current) drug therapy: Secondary | ICD-10-CM | POA: Diagnosis not present

## 2014-11-16 DIAGNOSIS — R109 Unspecified abdominal pain: Secondary | ICD-10-CM

## 2014-11-16 DIAGNOSIS — E1140 Type 2 diabetes mellitus with diabetic neuropathy, unspecified: Secondary | ICD-10-CM | POA: Insufficient documentation

## 2014-11-16 DIAGNOSIS — Z3202 Encounter for pregnancy test, result negative: Secondary | ICD-10-CM | POA: Diagnosis not present

## 2014-11-16 DIAGNOSIS — R1031 Right lower quadrant pain: Secondary | ICD-10-CM | POA: Diagnosis not present

## 2014-11-16 LAB — CBC WITH DIFFERENTIAL/PLATELET
BASOS ABS: 0 10*3/uL (ref 0.0–0.1)
BASOS PCT: 1 % (ref 0–1)
Eosinophils Absolute: 0.1 10*3/uL (ref 0.0–0.7)
Eosinophils Relative: 3 % (ref 0–5)
HCT: 37 % (ref 36.0–46.0)
HEMOGLOBIN: 13.2 g/dL (ref 12.0–15.0)
Lymphocytes Relative: 29 % (ref 12–46)
Lymphs Abs: 1.6 10*3/uL (ref 0.7–4.0)
MCH: 29.7 pg (ref 26.0–34.0)
MCHC: 35.7 g/dL (ref 30.0–36.0)
MCV: 83.3 fL (ref 78.0–100.0)
MONOS PCT: 7 % (ref 3–12)
Monocytes Absolute: 0.4 10*3/uL (ref 0.1–1.0)
NEUTROS ABS: 3.3 10*3/uL (ref 1.7–7.7)
Neutrophils Relative %: 60 % (ref 43–77)
Platelets: 232 10*3/uL (ref 150–400)
RBC: 4.44 MIL/uL (ref 3.87–5.11)
RDW: 13.1 % (ref 11.5–15.5)
WBC: 5.5 10*3/uL (ref 4.0–10.5)

## 2014-11-16 LAB — COMPREHENSIVE METABOLIC PANEL
ALT: 41 U/L — AB (ref 0–35)
AST: 32 U/L (ref 0–37)
Albumin: 3.1 g/dL — ABNORMAL LOW (ref 3.5–5.2)
Alkaline Phosphatase: 57 U/L (ref 39–117)
Anion gap: 7 (ref 5–15)
BUN: 10 mg/dL (ref 6–23)
CO2: 29 mmol/L (ref 19–32)
Calcium: 8.9 mg/dL (ref 8.4–10.5)
Chloride: 99 mmol/L (ref 96–112)
Creatinine, Ser: 0.46 mg/dL — ABNORMAL LOW (ref 0.50–1.10)
GFR calc Af Amer: >90 mL/min (ref >90)
Glucose, Bld: 326 mg/dL — ABNORMAL HIGH (ref 70–99)
Potassium: 4.2 mmol/L (ref 3.5–5.1)
Sodium: 135 mmol/L (ref 135–145)
Total Bilirubin: 0.4 mg/dL (ref 0.3–1.2)
Total Protein: 6.2 g/dL (ref 6.0–8.3)

## 2014-11-16 LAB — URINALYSIS, ROUTINE W REFLEX MICROSCOPIC
Bilirubin Urine: NEGATIVE
Ketones, ur: NEGATIVE mg/dL
Nitrite: NEGATIVE
PROTEIN: NEGATIVE mg/dL
Specific Gravity, Urine: 1.03 (ref 1.005–1.030)
Urobilinogen, UA: 0.2 mg/dL (ref 0.0–1.0)
pH: 6.5 (ref 5.0–8.0)

## 2014-11-16 LAB — URINE MICROSCOPIC-ADD ON

## 2014-11-16 LAB — PREGNANCY, URINE: Preg Test, Ur: NEGATIVE

## 2014-11-16 MED ORDER — IOHEXOL 300 MG/ML  SOLN
100.0000 mL | Freq: Once | INTRAMUSCULAR | Status: AC | PRN
  Administered 2014-11-16: 100 mL via INTRAVENOUS

## 2014-11-16 MED ORDER — IOHEXOL 300 MG/ML  SOLN
100.0000 mL | Freq: Once | INTRAMUSCULAR | Status: AC | PRN
  Administered 2014-11-16: 50 mL via ORAL

## 2014-11-16 MED ORDER — DICYCLOMINE HCL 20 MG PO TABS: ORAL_TABLET | ORAL | Status: DC

## 2014-11-16 NOTE — Discharge Instructions (Signed)
Follow up with your md next week for recheck °

## 2014-11-16 NOTE — ED Provider Notes (Signed)
CSN: [id number]     Arrival date & time 11/16/14  4098 History   First MD Initiated Contact with Patient 11/16/14 0818     Chief Complaint  Patient presents with  . Abdominal Pain     (Consider location/radiation/quality/duration/timing/severity/associated sxs/prior Treatment) Patient is a 34 y.o. female presenting with abdominal pain. The history is provided by the patient (the pt complains of abd pain for a month).  Abdominal Pain Pain location:  RLQ Pain quality: aching   Pain radiates to:  Does not radiate Pain severity:  Moderate Onset quality:  Gradual Timing:  Constant Progression:  Waxing and waning Chronicity:  New Context: not alcohol use   Associated symptoms: no chest pain, no cough, no diarrhea, no fatigue and no hematuria     Past Medical History  Diagnosis Date  . Diabetes mellitus without complication   . Depression   . High cholesterol   . Anemia   . Carpal tunnel syndrome   . Diabetic neuropathy   . Sickle cell anemia    Past Surgical History  Procedure Laterality Date  . Cesarean section    . Mouth surgery    . Tubal ligation     Family History  Problem Relation Age of Onset  . Diabetes Mother   . Hypertension Mother   . Hyperlipidemia Mother   . Cancer Father    History  Substance Use Topics  . Smoking status: Never Smoker   . Smokeless tobacco: Never Used  . Alcohol Use: No   OB History    No data available     Review of Systems  Constitutional: Negative for appetite change and fatigue.  HENT: Negative for congestion, ear discharge and sinus pressure.   Eyes: Negative for discharge.  Respiratory: Negative for cough.   Cardiovascular: Negative for chest pain.  Gastrointestinal: Positive for abdominal pain. Negative for diarrhea.  Genitourinary: Negative for frequency and hematuria.  Musculoskeletal: Negative for back pain.  Skin: Negative for rash.  Neurological: Negative for seizures and headaches.  Psychiatric/Behavioral:  Negative for hallucinations.      Allergies  Magnesium-containing compounds and Tramadol  Home Medications   Prior to Admission medications   Medication Sig Start Date End Date Taking? Authorizing Provider  cyclobenzaprine (FLEXERIL) 10 MG tablet Take 1 tablet (10 mg total) by mouth 2 (two) times daily as needed for muscle spasms. 10/03/14  Yes Monte Fantasia, PA-C  ibuprofen (ADVIL,MOTRIN) 800 MG tablet Take 800 mg by mouth every 6 (six) hours as needed for moderate pain.   Yes Historical Provider, MD  insulin aspart protamine- aspart (NOVOLOG MIX 70/30) (70-30) 100 UNIT/ML injection Inject 25-35 Units into the skin 2 (two) times daily with a meal. Takes 35 units in the morning and 25 units in the evening   Yes Historical Provider, MD  metFORMIN (GLUCOPHAGE) 500 MG tablet Take 500 mg by mouth 2 (two) times daily with a meal.   Yes Historical Provider, MD  naproxen (NAPROSYN) 500 MG tablet Take 500 mg by mouth 2 (two) times daily as needed for moderate pain.    Yes Historical Provider, MD  omeprazole (PRILOSEC) 20 MG capsule Take 1 capsule (20 mg total) by mouth daily. Patient taking differently: Take 20 mg by mouth daily as needed (acid reflux).  08/25/14  Yes Arby Barrette, MD  ondansetron (ZOFRAN ODT) 4 MG disintegrating tablet Take 1 tablet (4 mg total) by mouth every 4 (four) hours as needed for nausea or vomiting. 08/25/14  Yes Arby Barrette, MD  PARoxetine (PAXIL) 30 MG tablet Take 30 mg by mouth daily.   Yes Historical Provider, MD  predniSONE (DELTASONE) 10 MG tablet Take 10 mg by mouth daily as needed (for tightness in chest).    Yes Historical Provider, MD  ibuprofen (ADVIL,MOTRIN) 600 MG tablet Take 1 tablet (600 mg total) by mouth every 6 (six) hours as needed. Patient not taking: Reported on 11/16/2014 10/03/14   Monte Fantasia, PA-C   BP 104/59 mmHg  Pulse 78  Temp(Src) 98.1 F (36.7 C) (Oral)  Resp 16  SpO2 97%  LMP 11/05/2014 Physical Exam  Constitutional: She is  oriented to person, place, and time. She appears well-developed.  HENT:  Head: Normocephalic.  Eyes: Conjunctivae and EOM are normal. No scleral icterus.  Neck: Neck supple. No thyromegaly present.  Cardiovascular: Normal rate and regular rhythm.  Exam reveals no gallop and no friction rub.   No murmur heard. Pulmonary/Chest: No stridor. She has no wheezes. She has no rales. She exhibits no tenderness.  Abdominal: She exhibits no distension. There is tenderness. There is no rebound.  Tender rlq  Musculoskeletal: Normal range of motion. She exhibits no edema.  Lymphadenopathy:    She has no cervical adenopathy.  Neurological: She is oriented to person, place, and time. She exhibits normal muscle tone. Coordination normal.  Skin: No rash noted. No erythema.  Psychiatric: She has a normal mood and affect. Her behavior is normal.    ED Course  Procedures (including critical care time) Labs Review Labs Reviewed  COMPREHENSIVE METABOLIC PANEL - Abnormal; Notable for the following:    Glucose, Bld 326 (*)    Creatinine, Ser 0.46 (*)    Albumin 3.1 (*)    ALT 41 (*)    All other components within normal limits  URINALYSIS, ROUTINE W REFLEX MICROSCOPIC - Abnormal; Notable for the following:    APPearance CLOUDY (*)    Glucose, UA >1000 (*)    Hgb urine dipstick LARGE (*)    Leukocytes, UA SMALL (*)    All other components within normal limits  URINE MICROSCOPIC-ADD ON - Abnormal; Notable for the following:    Bacteria, UA MANY (*)    All other components within normal limits  CBC WITH DIFFERENTIAL/PLATELET  PREGNANCY, URINE    Imaging Review Ct Abdomen Pelvis W Contrast  11/16/2014   CLINICAL DATA:  Right lower quadrant pain for 1 month  EXAM: CT ABDOMEN AND PELVIS WITH CONTRAST  TECHNIQUE: Multidetector CT imaging of the abdomen and pelvis was performed using the standard protocol following bolus administration of intravenous contrast.  CONTRAST:  50mL OMNIPAQUE IOHEXOL 300 MG/ML  SOLN, OMNIPAQUE IOHEXOL 300 MG/ML SOLN  COMPARISON:  07/04/2014  FINDINGS: The lung bases are free of acute infiltrate or sizable effusion.  The liver, gallbladder, spleen, adrenal glands and pancreas are normal in their CT appearance. The kidneys are well visualized bilaterally without evidence of renal calculi or urinary tract obstructive changes.  The bladder is well distended. The uterus is within normal limits. 2.6 cm left ovarian cyst is noted. The appendix is well visualized and within normal limits. Mild diverticular change of the colon is noted without diverticulitis. No significant lymphadenopathy or bony abnormality is seen. A stable fluid collection is noted in the anterior abdominal wall which may be related to postoperative seroma.  IMPRESSION: Left ovarian cyst.  This is stable from the prior exam.  Normal-appearing appendix.  No other focal abnormality is noted.   Electronically Signed   By:  Alcide CleverMark  Lukens M.D.   On: 11/16/2014 12:35     EKG Interpretation None      MDM   Final diagnoses:  Pain  Abdominal pain in female    abd pain nl studies,  tx with bentyl and follow up next week.    Benny LennertJoseph L Kayde Atkerson, MD 11/16/14 1310

## 2014-11-16 NOTE — ED Notes (Signed)
Pt reports RLQ pain worsening over past month. Pt reports nausea but denies vomiting/emesis.

## 2014-11-16 NOTE — ED Notes (Signed)
Patient transported to CT 

## 2015-05-05 ENCOUNTER — Emergency Department (HOSPITAL_COMMUNITY)
Admission: EM | Admit: 2015-05-05 | Discharge: 2015-05-05 | Disposition: A | Discharge status: Home / Self Care | Payer: 59 | Attending: Emergency Medicine | Admitting: Emergency Medicine

## 2015-05-05 ENCOUNTER — Encounter (HOSPITAL_COMMUNITY): Payer: Self-pay | Admitting: Emergency Medicine

## 2015-05-05 DIAGNOSIS — Y9289 Other specified places as the place of occurrence of the external cause: Secondary | ICD-10-CM | POA: Insufficient documentation

## 2015-05-05 DIAGNOSIS — R000 Tachycardia, unspecified: Secondary | ICD-10-CM | POA: Insufficient documentation

## 2015-05-05 DIAGNOSIS — F329 Major depressive disorder, single episode, unspecified: Secondary | ICD-10-CM | POA: Insufficient documentation

## 2015-05-05 DIAGNOSIS — E1140 Type 2 diabetes mellitus with diabetic neuropathy, unspecified: Secondary | ICD-10-CM | POA: Insufficient documentation

## 2015-05-05 DIAGNOSIS — S40861A Insect bite (nonvenomous) of right upper arm, initial encounter: Secondary | ICD-10-CM | POA: Insufficient documentation

## 2015-05-05 DIAGNOSIS — Y998 Other external cause status: Secondary | ICD-10-CM | POA: Insufficient documentation

## 2015-05-05 DIAGNOSIS — W57XXXA Bitten or stung by nonvenomous insect and other nonvenomous arthropods, initial encounter: Secondary | ICD-10-CM | POA: Insufficient documentation

## 2015-05-05 DIAGNOSIS — Y9389 Activity, other specified: Secondary | ICD-10-CM | POA: Insufficient documentation

## 2015-05-05 DIAGNOSIS — G5601 Carpal tunnel syndrome, right upper limb: Secondary | ICD-10-CM | POA: Insufficient documentation

## 2015-05-05 DIAGNOSIS — Z862 Personal history of diseases of the blood and blood-forming organs and certain disorders involving the immune mechanism: Secondary | ICD-10-CM | POA: Insufficient documentation

## 2015-05-05 DIAGNOSIS — M25531 Pain in right wrist: Secondary | ICD-10-CM

## 2015-05-05 DIAGNOSIS — G8929 Other chronic pain: Secondary | ICD-10-CM

## 2015-05-05 DIAGNOSIS — Z794 Long term (current) use of insulin: Secondary | ICD-10-CM | POA: Insufficient documentation

## 2015-05-05 DIAGNOSIS — Z79899 Other long term (current) drug therapy: Secondary | ICD-10-CM | POA: Insufficient documentation

## 2015-05-05 DIAGNOSIS — R202 Paresthesia of skin: Secondary | ICD-10-CM | POA: Insufficient documentation

## 2015-05-05 MED ORDER — SULFAMETHOXAZOLE-TRIMETHOPRIM 800-160 MG PO TABS: 1.0000 | ORAL_TABLET | Freq: Two times a day (BID) | ORAL | Status: DC

## 2015-05-05 MED ORDER — HYDROCODONE-ACETAMINOPHEN 5-325 MG PO TABS: 1.0000 | ORAL_TABLET | Freq: Four times a day (QID) | ORAL | Status: DC | PRN

## 2015-05-05 MED ORDER — NAPROXEN 500 MG PO TABS: 500.0000 mg | ORAL_TABLET | Freq: Two times a day (BID) | ORAL | Status: DC | PRN

## 2015-05-05 NOTE — Discharge Instructions (Signed)
Keep wound clean and dry. Apply warm compresses to affected area throughout the day. Take antibiotic until it is finished. Take naprosyn and norco as directed, as needed for pain but do not drive or operate machinery with pain medication use. Followup with [facility]Primary Care doctor in 2 days for wound recheck. Monitor area for signs of infection to include, but not limited to: increasing pain, redness, drainage/pus, or swelling.  For your wrist carpal tunnel, use the cock up splint for the next 2 days all day/night then switch to using only at night. Follow up with the hand specialist in 1-2 weeks for ongoing management of this condition.  Return to emergency department for emergent changing or worsening symptoms.    Carpal Tunnel Syndrome The carpal tunnel is an area under the skin of the palm of your hand. Nerves, blood vessels, and strong tissues (tendons) pass through the tunnel. The tunnel can become puffy (swollen). If this happens, a nerve can be pinched in the wrist. This causes carpal tunnel syndrome.  HOME CARE  Take all medicine as told by your doctor.  If you were given a splint, wear it as told. Wear it at night or at times when your doctor told you to.  Rest your wrist from the activity that causes your pain.  Put ice on your wrist after long periods of wrist activity.  Put ice in a plastic bag.  Place a towel between your skin and the bag.  Leave the ice on for 15-20 minutes, 03-04 times a day.  Keep all doctor visits as told. GET HELP RIGHT AWAY IF:  You have new problems you cannot explain.  Your problems get worse and medicine does not help. MAKE SURE YOU:   Understand these instructions.  Will watch your condition.  Will get help right away if you are not doing well or get worse. Document Released: 08/27/2011 Document Revised: 11/30/2011 Document Reviewed: 08/27/2011 [facility] Patient Information 2015 [place], [place]. This information is not  intended to replace advice given to you by your health care provider. Make sure you discuss any questions you have with your health care provider.  Heat Therapy Heat therapy can help make painful, stiff muscles and joints feel better. Do not use heat on new injuries. Wait at least 48 hours after an injury to use heat. Do not use heat when you have aches or pains right after an activity. If you still have pain 3 hours after stopping the activity, then you may use heat. HOME CARE Wet heat pack  Soak a clean towel in warm water. Squeeze out the extra water.  Put the warm, wet towel in a plastic bag.  Place a thin, dry towel between your skin and the bag.  Put the heat pack on the area for 5 minutes, and check your skin. Your skin may be pink, but it should not be red.  Leave the heat pack on the area for 15 to 30 minutes.  Repeat this every 2 to 4 hours while awake. Do not use heat while you are sleeping. Warm water bath  Fill a tub with warm water.  Place the affected body part in the tub.  Soak the area for 20 to 40 minutes.  Repeat as needed. Hot water bottle  Fill the water bottle half full with hot water.  Press out the extra air. Close the cap tightly.  Place a dry towel between your skin and the bottle.  Put the bottle on  the area for 5 minutes, and check your skin. Your skin may be pink, but it should not be red.  Leave the bottle on the area for 15 to 30 minutes.  Repeat this every 2 to 4 hours while awake. Electric heating pad  Place a dry towel between your skin and the heating pad.  Set the heating pad on low heat.  Put the heating pad on the area for 10 minutes, and check your skin. Your skin may be pink, but it should not be red.  Leave the heating pad on the area for 20 to 40 minutes.  Repeat this every 2 to 4 hours while awake.  Do not lie on the heating pad.  Do not fall asleep while using the heating pad.  Do not use the heating pad near  water. GET HELP RIGHT AWAY IF:  You get blisters or red skin.  Your skin is puffy (swollen), or you lose feeling (numbness) in the affected area.  You have any new problems.  Your problems are getting worse.  You have any questions or concerns. If you have any problems, stop using heat therapy until you see your doctor. MAKE SURE YOU:  Understand these instructions.  Will watch your condition.  Will get help right away if you are not doing well or get worse. Document Released: 11/30/2011 Document Reviewed: 10/31/2013 [facility] Patient Information 2015 [place], [place]. This information is not intended to replace advice given to you by your health care provider. Make sure you discuss any questions you have with your health care provider.  Insect Bite Mosquitoes, flies, fleas, bedbugs, and many other insects can bite. Insect bites are different from insect stings. A sting is when venom is injected into the skin. Some insect bites can transmit infectious diseases. SYMPTOMS  Insect bites usually turn red, swell, and itch for 2 to 4 days. They often go away on their own. TREATMENT  Your caregiver may prescribe antibiotic medicines if a bacterial infection develops in the bite. HOME CARE INSTRUCTIONS  Do not scratch the bite area.  Keep the bite area clean and dry. Wash the bite area thoroughly with soap and water.  Put ice or cool compresses on the bite area.  Put ice in a plastic bag.  Place a towel between your skin and the bag.  Leave the ice on for 20 minutes, 4 times a day for the first 2 to 3 days, or as directed.  You may apply a baking soda paste, cortisone cream, or calamine lotion to the bite area as directed by your caregiver. This can help reduce itching and swelling.  Only take over-the-counter or prescription medicines as directed by your caregiver.  If you are given antibiotics, take them as directed. Finish them even if you start to feel better. You may need a  tetanus shot if:  You cannot remember when you had your last tetanus shot.  You have never had a tetanus shot.  The injury broke your skin. If you get a tetanus shot, your arm may swell, get red, and feel warm to the touch. This is common and not a problem. If you need a tetanus shot and you choose not to have one, there is a rare chance of getting tetanus. Sickness from tetanus can be serious. SEEK IMMEDIATE MEDICAL CARE IF:   You have increased pain, redness, or swelling in the bite area.  You see a red line on the skin coming from the bite.  You have  a fever.  You have joint pain.  You have a headache or neck pain.  You have unusual weakness.  You have a rash.  You have chest pain or shortness of breath.  You have abdominal pain, nausea, or vomiting.  You feel unusually tired or sleepy. MAKE SURE YOU:   Understand these instructions.  Will watch your condition.  Will get help right away if you are not doing well or get worse. Document Released: 10/15/2004 Document Revised: 11/30/2011 Document Reviewed: 04/08/2011 [facility] Patient Information 2015 [place], [place]. This information is not intended to replace advice given to you by your health care provider. Make sure you discuss any questions you have with your health care provider.

## 2015-05-05 NOTE — ED Notes (Signed)
Pt reports R wrist and forearm pain 5 days ago with numbness in thumb. Pt able to move thumb. Hx of carpel tunnel. Also has bug bite to R arm pit.

## 2015-05-05 NOTE — ED Provider Notes (Signed)
CSN: [id number]     Arrival date & time 05/05/15  1535 History  This chart was scribed for non-physician provider working with Marily Memos, MD by Phillis Haggis, ED Scribe. This patient was seen in room WTR7/WTR7 and patient care was started at 4:09 PM.    Chief Complaint  Patient presents with  . Arm Pain   Patient is a 34 y.o. female presenting with arm pain. The history is provided by the patient. No language interpreter was used.  Arm Pain This is a recurrent problem. The current episode started more than 2 days ago. The problem occurs constantly. The problem has been gradually worsening. Pertinent negatives include no chest pain, no abdominal pain, no headaches and no shortness of breath. The symptoms are aggravated by bending and twisting (all movements). Nothing relieves the symptoms. She has tried a cold compress, acetaminophen, rest and ASA for the symptoms. The treatment provided no relief.   HPI Comments: [name] is a 34 y.o. female with a PMHx of DM, sickle cell anemia, diabetic neuropathy, and carpal tunnel syndrome, who presents to the ED with complaints of right wrist and forearm pain onset 5 days ago. Pt reports pain is 6/10, constant, stabbing/squeezing pain, radiating to right forearm, worse with movement, and use of tylenol arthritis, ibuprofen, naproxen, icing, and elevation to no relief. Reports associated burning tingling to thumb and palmar surface of digits 1-3. Reports that these symptoms are somewhat consistent with her carpal tunnel syndrome but she doesn't usually have pain in her forearm. No recent injury. Additionally she states that she got bitten by an insect to the right axilla 2 days ago and it feels indurated with some redness but without drainage. Denies fevers, chills, CP, SOB, abd pain, N/V/D/C, dysuria, elbow pain, neck pain, back pain, numbness, or weakness.   Past Medical History  Diagnosis Date  . Diabetes mellitus without complication   .  Depression   . High cholesterol   . Anemia   . Carpal tunnel syndrome   . Diabetic neuropathy   . Sickle cell anemia    Past Surgical History  Procedure Laterality Date  . Cesarean section    . Mouth surgery    . Tubal ligation     Family History  Problem Relation Age of Onset  . Diabetes Mother   . Hypertension Mother   . Hyperlipidemia Mother   . Cancer Father    Social History  Substance Use Topics  . Smoking status: Never Smoker   . Smokeless tobacco: Never Used  . Alcohol Use: No   OB History    No data available     Review of Systems  Constitutional: Negative for fever and chills.  Eyes: Negative for visual disturbance.  Respiratory: Negative for shortness of breath.   Cardiovascular: Negative for chest pain.  Gastrointestinal: Negative for nausea, vomiting, abdominal pain, diarrhea and constipation.  Genitourinary: Negative for dysuria.  Musculoskeletal: Positive for myalgias and arthralgias. Negative for back pain and neck pain.  Skin: Positive for wound (insect bite to R armpit). Negative for rash.  Allergic/Immunologic: Positive for immunocompromised state (diabetic).  Neurological: Negative for weakness, numbness and headaches.       +tingling to right hand on volar aspect of digits 1-3  10 Systems reviewed and all are negative for acute change except as noted in the HPI.  Allergies  Magnesium-containing compounds and Tramadol  Home Medications   Prior to Admission medications   Medication Sig Start Date End Date Taking? Authorizing  Provider  albuterol (PROVENTIL HFA) 108 (90 BASE) MCG/ACT inhaler Inhale 2 puffs into the lungs every 4 (four) hours as needed for wheezing or shortness of breath.  04/16/15  Yes Historical Provider, MD  butalbital-aspirin-caffeine Benny Lennert) 50-325-40 MG per capsule Take 1 capsule by mouth daily as needed for headache.  08/01/13  Yes Historical Provider, MD  cyclobenzaprine (FLEXERIL) 10 MG tablet Take 1 tablet (10 mg total)  by mouth 2 (two) times daily as needed for muscle spasms. 10/03/14  Yes Ladona Mow, PA-C  dicyclomine (BENTYL) 20 MG tablet Take one pill every 8 hours for abd cramps 11/16/14  Yes Bethann Berkshire, MD  DULoxetine (CYMBALTA) 60 MG capsule Take 60 mg by mouth daily. 11/26/14  Yes Historical Provider, MD  ibuprofen (ADVIL,MOTRIN) 600 MG tablet Take 1 tablet (600 mg total) by mouth every 6 (six) hours as needed. 10/03/14  Yes Ladona Mow, PA-C  insulin aspart protamine- aspart (NOVOLOG MIX 70/30) (70-30) 100 UNIT/ML injection Inject 25-35 Units into the skin 2 (two) times daily with a meal. Takes 35 units in the morning and 25 units in the evening   Yes Historical Provider, MD  metFORMIN (GLUCOPHAGE) 500 MG tablet Take 500 mg by mouth 2 (two) times daily with a meal.   Yes Historical Provider, MD  naproxen (NAPROSYN) 500 MG tablet Take 500 mg by mouth 2 (two) times daily as needed for moderate pain.    Yes Historical Provider, MD  omeprazole (PRILOSEC) 20 MG capsule Take 1 capsule (20 mg total) by mouth daily. Patient taking differently: Take 20 mg by mouth daily as needed (acid reflux).  08/25/14  Yes Arby Barrette, MD  ondansetron (ZOFRAN ODT) 4 MG disintegrating tablet Take 1 tablet (4 mg total) by mouth every 4 (four) hours as needed for nausea or vomiting. 08/25/14  Yes Arby Barrette, MD  PARoxetine (PAXIL) 30 MG tablet Take 45 mg by mouth daily.    Yes Historical Provider, MD  predniSONE (DELTASONE) 10 MG tablet Take 10 mg by mouth daily as needed (for tightness in chest).    Yes Historical Provider, MD   BP 109/60 mmHg  Pulse 109  Temp(Src) 98.5 F (36.9 C) (Oral)  Resp 18  SpO2 97%  Physical Exam  Constitutional: She is oriented to person, place, and time. Vital signs are normal. She appears well-developed and well-nourished.  Non-toxic appearance. No distress.  Afebrile, nontoxic, NAD  HENT:  Head: Normocephalic and atraumatic.  Mouth/Throat: Mucous membranes are normal.  Eyes: Conjunctivae and  EOM are normal. Right eye exhibits no discharge. Left eye exhibits no discharge.  Neck: Normal range of motion. Neck supple.  Cardiovascular: Intact distal pulses.  Tachycardia present.   Mildly tachycardic, which pt states is baseline  Pulmonary/Chest: Effort normal. No respiratory distress.  Abdominal: Normal appearance. She exhibits no distension.  Musculoskeletal: Normal range of motion.       Right wrist: She exhibits tenderness. She exhibits normal range of motion, no bony tenderness, no swelling, no crepitus and no deformity.  Right wrist and forearm with mild diffuse TTP of the volar aspect. No focal bony TTP, no elbow TTP. FROM intact. No swelling, crepitus, or deformity. No erythema or bruising. +tinel's sign. Strength and sensation grossly intact, distal pulses intact.   Neurological: She is alert and oriented to person, place, and time. She has normal strength. No sensory deficit.  Skin: Skin is warm, dry and intact. No rash noted. There is erythema.  Small approximately 2 mm insect bite to the right  armpit with no surrounding cellulitis, mildly indurated with no fluctuance, no drainage, mildly TTP, no warmth.   Psychiatric: She has a normal mood and affect. Her behavior is normal.  Nursing note and vitals reviewed.  ED Course  Procedures (including critical care time) DIAGNOSTIC STUDIES: Oxygen Saturation is 100% on RA, normal by my interpretation.    COORDINATION OF CARE: 4:16 PM-Discussed treatment plan which includes anti-biotics, hand brace, and follow up with orthopedist with pt at bedside and pt agreed to plan.   Labs Review Labs Reviewed - No data to display  Imaging Review No results found.    EKG Interpretation None      MDM   Final diagnoses:  Carpal tunnel syndrome of right wrist  Wrist pain, chronic, right  Insect bite    34 y.o. female here with R wrist/forearm pain x1wk, c/w carpal tunnel. +Tinel's sign. Paresthesia to median nerve distribution.  Neurovascularly intact with soft compartments. No focal neuro deficits. Additionally has small insect bite to R armpit, does not appear to be an abscess/cellulitis, but given that she's a diabetic will treat with abx in order to avoid any worsening. Will have her f/up with PCP in 2 days for recheck on this. Will place in cock up splint now and d/c home with pain meds and ortho f/up to discuss other options for carpal tunnel syndrome. I explained the diagnosis and have given explicit precautions to return to the ER including for any other new or worsening symptoms. The patient understands and accepts the medical plan as it's been dictated and I have answered their questions. Discharge instructions concerning home care and prescriptions have been given. The patient is STABLE and is discharged to home in good condition.   I personally performed the services described in this documentation, which was scribed in my presence. The recorded information has been reviewed and is accurate.   BP 109/60 mmHg  Pulse 109  Temp(Src) 98.5 F (36.9 C) (Oral)  Resp 18  SpO2 97%  Meds ordered this encounter  Medications  . sulfamethoxazole-trimethoprim (BACTRIM DS,SEPTRA DS) 800-160 MG per tablet    Sig: Take 1 tablet by mouth 2 (two) times daily.    Dispense:  14 tablet    Refill:  0    Order Specific Question:  Supervising Provider    Answer:  MILLER, BRIAN [[id number]]  . naproxen (NAPROSYN) 500 MG tablet    Sig: Take 1 tablet (500 mg total) by mouth 2 (two) times daily as needed for mild pain, moderate pain or headache (TAKE WITH MEALS.).    Dispense:  20 tablet    Refill:  0    Order Specific Question:  Supervising Provider    Answer:  MILLER, BRIAN [[id number]]  . HYDROcodone-acetaminophen (NORCO) 5-325 MG per tablet    Sig: Take 1 tablet by mouth every 6 (six) hours as needed for severe pain.    Dispense:  6 tablet    Refill:  0    Order Specific Question:  Supervising Provider    Answer:  Eber Hong  [[id number]]     Nicole Polzin Camprubi-Soms, PA-C 05/05/15 1633  Marily Memos, MD 05/07/15 1610

## 2016-01-10 ENCOUNTER — Encounter (HOSPITAL_COMMUNITY): Payer: Self-pay | Admitting: *Deleted

## 2016-01-10 ENCOUNTER — Emergency Department (HOSPITAL_COMMUNITY)
Admission: EM | Admit: 2016-01-10 | Discharge: 2016-01-10 | Disposition: A | Discharge status: Home / Self Care | Payer: Self-pay | Attending: Emergency Medicine | Admitting: Emergency Medicine

## 2016-01-10 DIAGNOSIS — Z79899 Other long term (current) drug therapy: Secondary | ICD-10-CM | POA: Insufficient documentation

## 2016-01-10 DIAGNOSIS — N12 Tubulo-interstitial nephritis, not specified as acute or chronic: Secondary | ICD-10-CM | POA: Insufficient documentation

## 2016-01-10 DIAGNOSIS — E1140 Type 2 diabetes mellitus with diabetic neuropathy, unspecified: Secondary | ICD-10-CM | POA: Insufficient documentation

## 2016-01-10 DIAGNOSIS — Z794 Long term (current) use of insulin: Secondary | ICD-10-CM | POA: Insufficient documentation

## 2016-01-10 DIAGNOSIS — Z8659 Personal history of other mental and behavioral disorders: Secondary | ICD-10-CM | POA: Insufficient documentation

## 2016-01-10 DIAGNOSIS — D649 Anemia, unspecified: Secondary | ICD-10-CM | POA: Insufficient documentation

## 2016-01-10 DIAGNOSIS — R739 Hyperglycemia, unspecified: Secondary | ICD-10-CM

## 2016-01-10 DIAGNOSIS — R197 Diarrhea, unspecified: Secondary | ICD-10-CM | POA: Insufficient documentation

## 2016-01-10 DIAGNOSIS — Z3202 Encounter for pregnancy test, result negative: Secondary | ICD-10-CM | POA: Insufficient documentation

## 2016-01-10 DIAGNOSIS — Z8669 Personal history of other diseases of the nervous system and sense organs: Secondary | ICD-10-CM | POA: Insufficient documentation

## 2016-01-10 DIAGNOSIS — Z7984 Long term (current) use of oral hypoglycemic drugs: Secondary | ICD-10-CM | POA: Insufficient documentation

## 2016-01-10 DIAGNOSIS — R112 Nausea with vomiting, unspecified: Secondary | ICD-10-CM

## 2016-01-10 DIAGNOSIS — E1165 Type 2 diabetes mellitus with hyperglycemia: Secondary | ICD-10-CM | POA: Insufficient documentation

## 2016-01-10 HISTORY — DX: Other hemoglobinopathies: D58.2

## 2016-01-10 LAB — URINE MICROSCOPIC-ADD ON

## 2016-01-10 LAB — BASIC METABOLIC PANEL
Anion gap: 9 (ref 5–15)
BUN: 11 mg/dL (ref 6–20)
CALCIUM: 9.1 mg/dL (ref 8.9–10.3)
CHLORIDE: 105 mmol/L (ref 101–111)
CO2: 24 mmol/L (ref 22–32)
CREATININE: 0.54 mg/dL (ref 0.44–1.00)
GFR calc Af Amer: >60 mL/min (ref >60)
GFR calc non Af Amer: >60 mL/min (ref >60)
Glucose, Bld: 353 mg/dL — ABNORMAL HIGH (ref 65–99)
Potassium: 3.9 mmol/L (ref 3.5–5.1)
Sodium: 138 mmol/L (ref 135–145)

## 2016-01-10 LAB — DIFFERENTIAL
BASOS ABS: 0 10*3/uL (ref 0.0–0.1)
BASOS PCT: 1 %
EOS ABS: 0.1 10*3/uL (ref 0.0–0.7)
EOS PCT: 2 %
LYMPHS ABS: 1.9 10*3/uL (ref 0.7–4.0)
Lymphocytes Relative: 33 %
MONO ABS: 0.2 10*3/uL (ref 0.1–1.0)
MONOS PCT: 3 %
Neutro Abs: 3.5 10*3/uL (ref 1.7–7.7)
Neutrophils Relative %: 61 %

## 2016-01-10 LAB — URINALYSIS, ROUTINE W REFLEX MICROSCOPIC
Bilirubin Urine: NEGATIVE
KETONES UR: 40 mg/dL — AB
Nitrite: NEGATIVE
PROTEIN: NEGATIVE mg/dL
Specific Gravity, Urine: 1.045 — ABNORMAL HIGH (ref 1.005–1.030)
pH: 5.5 (ref 5.0–8.0)

## 2016-01-10 LAB — CBG MONITORING, ED: Glucose-Capillary: 322 mg/dL — ABNORMAL HIGH (ref 65–99)

## 2016-01-10 LAB — CBC
HEMATOCRIT: 37.9 % (ref 36.0–46.0)
HEMOGLOBIN: 13.5 g/dL (ref 12.0–15.0)
MCH: 29.1 pg (ref 26.0–34.0)
MCHC: 35.6 g/dL (ref 30.0–36.0)
MCV: 81.7 fL (ref 78.0–100.0)
Platelets: 240 10*3/uL (ref 150–400)
RBC: 4.64 MIL/uL (ref 3.87–5.11)
RDW: 12.7 % (ref 11.5–15.5)
WBC: 5.6 10*3/uL (ref 4.0–10.5)

## 2016-01-10 LAB — POC URINE PREG, ED: Preg Test, Ur: NEGATIVE

## 2016-01-10 MED ORDER — SODIUM CHLORIDE 0.9 % IV BOLUS (SEPSIS)
1000.0000 mL | Freq: Once | INTRAVENOUS | Status: AC
  Administered 2016-01-10: 1000 mL via INTRAVENOUS

## 2016-01-10 MED ORDER — CEPHALEXIN 500 MG PO CAPS: 500.0000 mg | ORAL_CAPSULE | Freq: Three times a day (TID) | ORAL | Status: DC

## 2016-01-10 MED ORDER — DEXTROSE 5 % IV SOLN
1.0000 g | Freq: Once | INTRAVENOUS | Status: AC
  Administered 2016-01-10: 1 g via INTRAVENOUS
  Filled 2016-01-10: qty 10

## 2016-01-10 MED ORDER — ONDANSETRON HCL 4 MG PO TABS: 4.0000 mg | ORAL_TABLET | Freq: Three times a day (TID) | ORAL | Status: DC | PRN

## 2016-01-10 MED ORDER — ONDANSETRON HCL 4 MG/2ML IJ SOLN
4.0000 mg | Freq: Once | INTRAMUSCULAR | Status: AC
  Administered 2016-01-10: 4 mg via INTRAVENOUS
  Filled 2016-01-10: qty 2

## 2016-01-10 NOTE — Progress Notes (Signed)
Inpatient Diabetes Program Recommendations  AACE/ADA: New Consensus Statement on Inpatient Glycemic Control (2015)  Target Ranges:  Prepandial:   less than 140 mg/dL      Peak postprandial:   less than 180 mg/dL (1-2 hours)      Critically ill patients:  140 - 180 mg/dL   Review of Glycemic Control  Diabetes history: DM2 Outpatient Diabetes medications: 70/30 35 units in am and 25 units in pm, metformin 500 mg bid Current orders for Inpatient glycemic control: None  Results for [name], [name] (MRN [id number]) as of 01/10/2016 11:52  Ref. Range 01/10/2016 10:53  Glucose-Capillary Latest Ref Range: 65-99 mg/dL 562322 (H)  Results for [name], [name] (MRN [id number]) as of 01/10/2016 11:52  Ref. Range 01/10/2016 10:56  Glucose Latest Ref Range: 65-99 mg/dL 696353 (H)  AG - 9 +ketones in urine. Needs basal insulin to prevent progression to DKA.  Inpatient Diabetes Program Recommendations:    Lantus 20 units now. Novolog moderate Q4H  Will call to speak with RN.  Thank you. Ailene Ardshonda Camdyn Beske, RD, LDN, CDE Inpatient Diabetes Coordinator [phone]

## 2016-01-10 NOTE — Progress Notes (Addendum)
Pt confirms she has a pcp for f/u care at [facility] [place] [facility] Jomarie LongsJoseph [name] EPIC updated

## 2016-01-10 NOTE — ED Notes (Addendum)
Patient states since around 10:30 last night she has had N/V/D. Onset was acute.  Patient denies known sick contacts.  Patient's only home med is Metformin which she has taken for DM type 2 for several years.  Patient denies chest and abdominal pain.  Patient denies hematuria, melena and hematochezia.  Patient also denies fever, cough, congestion and sore throat.  Patient has not attempted treatment at home.  Patient has not checked her blood glucose today.  Patient states she "just got over a urinary tract infection" about a week ago.  She was not treated with abx and states she drank cranberry juice.  She denies current dysuria and hematuria.  She endorses occasional right flank pain.

## 2016-01-10 NOTE — ED Provider Notes (Signed)
CSN: [id number]     Arrival date & time 01/10/16  1017 History   First MD Initiated Contact with Patient 01/10/16 1049     Chief Complaint  Patient presents with  . Emesis  . Diarrhea     (Consider location/radiation/quality/duration/timing/severity/associated sxs/prior Treatment) HPI 35 year old female who presents with nausea, vomiting, and diarrhea that started 10:30 yesterday evening. History of diabetes, hyperlipidemia, not prior cesarean section. States that she otherwise has been in her usual state of health. 8 at [employer] fish sandwich yesterday evening and subsequently had multiple episodes of nonbloody nonbilious emesis and nonbloody diarrhea. No known sick contacts. No fevers, chills, abdominal pain, dysuria, urinary frequency, hematuria, abnormal vaginal bleeding or discharge. No chest pain or difficulty breathing, or upper respiratory infections. States that she thinks she may have had a UTI recently with some right flank pain that she sometimes gets with UTIs. Has been treating with her cranberry juice, and symptoms have improved. Past Medical History  Diagnosis Date  . Diabetes mellitus without complication (HCC)   . Depression   . High cholesterol   . Anemia   . Carpal tunnel syndrome   . Diabetic neuropathy (HCC)   . Hemoglobin C trait (HCC)    Past Surgical History  Procedure Laterality Date  . Cesarean section    . Mouth surgery    . Tubal ligation     Family History  Problem Relation Age of Onset  . Diabetes Mother   . Hypertension Mother   . Hyperlipidemia Mother   . Cancer Father    Social History  Substance Use Topics  . Smoking status: Never Smoker   . Smokeless tobacco: Never Used  . Alcohol Use: No   OB History    No data available     Review of Systems 10/14 systems reviewed and are negative other than those stated in the HPI    Allergies  Magnesium-containing compounds and Tramadol  Home Medications   Prior to Admission medications    Medication Sig Start Date End Date Taking? Authorizing Provider  acetaminophen (TYLENOL) 650 MG CR tablet Take 1,300 mg by mouth every 8 (eight) hours as needed for pain.   Yes Historical Provider, MD  ferrous sulfate 325 (65 FE) MG EC tablet Take 325 mg by mouth daily.   Yes Historical Provider, MD  insulin aspart protamine- aspart (NOVOLOG MIX 70/30) (70-30) 100 UNIT/ML injection Inject 25-35 Units into the skin 2 (two) times daily with a meal. Takes 35 units in the morning and 25 units in the evening   Yes Historical Provider, MD  metFORMIN (GLUCOPHAGE) 500 MG tablet Take 500 mg by mouth 2 (two) times daily with a meal.   Yes Historical Provider, MD  naproxen (NAPROSYN) 500 MG tablet Take 1 tablet (500 mg total) by mouth 2 (two) times daily as needed for mild pain, moderate pain or headache (TAKE WITH MEALS.). 05/05/15  Yes Mercedes Camprubi-Soms, PA-C  cephALEXin (KEFLEX) 500 MG capsule Take 1 capsule (500 mg total) by mouth 3 (three) times daily. 01/10/16   Lavera Guiseana Duo Liu, MD  ondansetron (ZOFRAN) 4 MG tablet Take 1 tablet (4 mg total) by mouth every 8 (eight) hours as needed for nausea or vomiting. 01/10/16   Lavera Guiseana Duo Liu, MD   BP 130/93 mmHg  Pulse 82  Temp(Src) 98.1 F (36.7 C) (Oral)  Resp 16  Ht 5\' 3"  (1.6 m)  Wt 210 lb (95.255 kg)  BMI 37.21 kg/m2  SpO2 99%  LMP 12/13/2015 Physical Exam  Physical Exam  Nursing note and vitals reviewed. Constitutional: Well developed, well nourished, non-toxic, and in no acute distress Head: Normocephalic and atraumatic.  Mouth/Throat: Oropharynx is clear and moist.  Neck: Normal range of motion. Neck supple.  Cardiovascular: Normal rate and regular rhythm.   Pulmonary/Chest: Effort normal and breath sounds normal.  Abdominal: Soft. No distension. There is no tenderness. There is no rebound and no guarding. Mild right flank tenderness Musculoskeletal: Normal range of motion.  Neurological: Alert, no facial droop, fluent speech, moves all  extremities symmetrically Skin: Skin is warm and dry.  Psychiatric: Cooperative  ED Course  Procedures (including critical care time) Labs Review Labs Reviewed  URINALYSIS, ROUTINE W REFLEX MICROSCOPIC (NOT AT [facility]) - Abnormal; Notable for the following:    APPearance CLOUDY (*)    Specific Gravity, Urine 1.045 (*)    Glucose, UA >1000 (*)    Hgb urine dipstick LARGE (*)    Ketones, ur 40 (*)    Leukocytes, UA SMALL (*)    All other components within normal limits  BASIC METABOLIC PANEL - Abnormal; Notable for the following:    Glucose, Bld 353 (*)    All other components within normal limits  URINE MICROSCOPIC-ADD ON - Abnormal; Notable for the following:    Squamous Epithelial / LPF 6-30 (*)    Bacteria, UA MANY (*)    All other components within normal limits  CBG MONITORING, ED - Abnormal; Notable for the following:    Glucose-Capillary 322 (*)    All other components within normal limits  URINE CULTURE  CBC  DIFFERENTIAL  POC URINE PREG, ED    Imaging Review No results found. I have personally reviewed and evaluated these images and lab results as part of my medical decision-making.   EKG Interpretation None      MDM   Final diagnoses:  Nausea vomiting and diarrhea  Pyelonephritis  Hyperglycemia   35 year old female with history of diabetes who presents with one day of nausea, vomiting, and diarrhea. On presentation is nontoxic in no acute distress. Vital signs within normal limits. She has a soft and benign abdomen. But does have some right flank tenderness to percussion. She does have some hyperglycemia on her blood work, but no evidence of anion gap or acidosis. On chart review, she tends to be hyperglycemic, most of her blood work. No major left side or metabolic derangements. She does have evidence of UTI and given that this is consistent with her prior presentations of UTIs she will be treated for pyelonephritis. Will be given a course of Keflex. No  leukocytosis or other signs concerning for sepsis. appropriate for outpatient management. Feels improved after 1 L of IV fluids and antibiotics. Will be discharged with a course of Keflex and antibiotics for home so that she can continue supportive care. When patient that with her diabetes and hyperglycemia she may not respond to outpatient treatment and gave her  warning signs for worsening symptoms and need for return to ED. Strict return and follow-up instructions reviewed. She expressed understanding of all discharge instructions and felt comfortable with the plan of care.   Lavera Guise, MD 01/10/16 1224

## 2016-01-10 NOTE — Discharge Instructions (Signed)
Please take antibiotics as prescribed for your urinary tract infection/kidney infection. Make sure you drink plenty of fluids and keep well-hydrated. Return without fail for worsening symptoms including fevers despite antibiotics, worsening pain, vomiting and unable to keep down food or fluids despite antibiotics, or any other symptoms concerning to you.  Diarrhea Diarrhea is frequent loose and watery bowel movements. It can cause you to feel weak and dehydrated. Dehydration can cause you to become tired and thirsty, have a dry mouth, and have decreased urination that often is dark yellow. Diarrhea is a sign of another problem, most often an infection that will not last long. In most cases, diarrhea typically lasts 2-3 days. However, it can last longer if it is a sign of something more serious. It is important to treat your diarrhea as directed by your caregiver to lessen or prevent future episodes of diarrhea. CAUSES  Some common causes include:  Gastrointestinal infections caused by viruses, bacteria, or parasites.  Food poisoning or food allergies.  Certain medicines, such as antibiotics, chemotherapy, and laxatives.  Artificial sweeteners and fructose.  Digestive disorders. HOME CARE INSTRUCTIONS  Ensure adequate fluid intake (hydration): Have 1 cup (8 oz) of fluid for each diarrhea episode. Avoid fluids that contain simple sugars or sports drinks, fruit juices, whole milk products, and sodas. Your urine should be clear or pale yellow if you are drinking enough fluids. Hydrate with an oral rehydration solution that you can purchase at pharmacies, retail stores, and online. You can prepare an oral rehydration solution at home by mixing the following ingredients together:   - tsp table salt.   tsp baking soda.   tsp salt substitute containing potassium chloride.  1  tablespoons sugar.  1 L (34 oz) of water.  Certain foods and beverages may increase the speed at which food moves  through the gastrointestinal (GI) tract. These foods and beverages should be avoided and include:  Caffeinated and alcoholic beverages.  High-fiber foods, such as raw fruits and vegetables, nuts, seeds, and whole grain breads and cereals.  Foods and beverages sweetened with sugar alcohols, such as xylitol, sorbitol, and mannitol.  Some foods may be well tolerated and may help thicken stool including:  Starchy foods, such as rice, toast, pasta, low-sugar cereal, oatmeal, grits, baked potatoes, crackers, and bagels.  Bananas.  Applesauce.  Add probiotic-rich foods to help increase healthy bacteria in the GI tract, such as yogurt and fermented milk products.  Wash your hands well after each diarrhea episode.  Only take over-the-counter or prescription medicines as directed by your caregiver.  Take a warm bath to relieve any burning or pain from frequent diarrhea episodes. SEEK IMMEDIATE MEDICAL CARE IF:   You are unable to keep fluids down.  You have persistent vomiting.  You have blood in your stool, or your stools are black and tarry.  You do not urinate in 6-8 hours, or there is only a small amount of very dark urine.  You have abdominal pain that increases or localizes.  You have weakness, dizziness, confusion, or light-headedness.  You have a severe headache.  Your diarrhea gets worse or does not get better.  You have a fever or persistent symptoms for more than 2-3 days.  You have a fever and your symptoms suddenly get worse. MAKE SURE YOU:   Understand these instructions.  Will watch your condition.  Will get help right away if you are not doing well or get worse.   This information is not intended to replace  advice given to you by your health care provider. Make sure you discuss any questions you have with your health care provider.   Document Released: 08/28/2002 Document Revised: 09/28/2014 Document Reviewed: 05/15/2012 Elsevier Interactive Patient  Education 2016 Elsevier Inc.  Nausea and Vomiting Nausea means you feel sick to your stomach. Throwing up (vomiting) is a reflex where stomach contents come out of your mouth. HOME CARE   Take medicine as told by your doctor.  Do not force yourself to eat. However, you do need to drink fluids.  If you feel like eating, eat a normal diet as told by your doctor.  Eat rice, wheat, potatoes, bread, lean meats, yogurt, fruits, and vegetables.  Avoid high-fat foods.  Drink enough fluids to keep your pee (urine) clear or pale yellow.  Ask your doctor how to replace body fluid losses (rehydrate). Signs of body fluid loss (dehydration) include:  Feeling very thirsty.  Dry lips and mouth.  Feeling dizzy.  Dark pee.  Peeing less than normal.  Feeling confused.  Fast breathing or heart rate. GET HELP RIGHT AWAY IF:   You have blood in your throw up.  You have black or bloody poop (stool).  You have a bad headache or stiff neck.  You feel confused.  You have bad belly (abdominal) pain.  You have chest pain or trouble breathing.  You do not pee at least once every 8 hours.  You have cold, clammy skin.  You keep throwing up after 24 to 48 hours.  You have a fever. MAKE SURE YOU:   Understand these instructions.  Will watch your condition.  Will get help right away if you are not doing well or get worse.   This information is not intended to replace advice given to you by your health care provider. Make sure you discuss any questions you have with your health care provider.   Document Released: 02/24/2008 Document Revised: 11/30/2011 Document Reviewed: 02/06/2011 Elsevier Interactive Patient Education 2016 Elsevier Inc.  Pyelonephritis, Adult Pyelonephritis is a kidney infection. The kidneys are the organs that filter a person's blood and move waste out of the bloodstream and into the urine. Urine passes from the kidneys, through the ureters, and into the  bladder. There are two main types of pyelonephritis:  Infections that come on quickly without any warning (acute pyelonephritis).  Infections that last for a long period of time (chronic pyelonephritis). In most cases, the infection clears up with treatment and does not cause further problems. More severe infections or chronic infections can sometimes spread to the bloodstream or lead to other problems with the kidneys. CAUSES This condition is usually caused by:  Bacteria traveling from the bladder to the kidney through infected urine. The urine in the bladder can become infected with bacteria from:  Bladder infection (cystitis).  Inflammation of the prostate gland (prostatitis).  Sexual intercourse, in females.  Bacteria traveling from the bloodstream to the kidney. RISK FACTORS This condition is more likely to develop in:  Pregnant women.  Older people.  People who have diabetes.  People who have kidney stones or bladder stones.  People who have other abnormalities of the kidney or ureter.  People who have a catheter placed in the bladder.  People who have cancer.  People who are sexually active.  Women who use spermicides.  People who have had a prior urinary tract infection. SYMPTOMS Symptoms of this condition include:  Frequent urination.  Strong or persistent urge to urinate.  Burning or  stinging when urinating.  Abdominal pain.  Back pain.  Pain in the side or flank area.  Fever.  Chills.  Blood in the urine, or dark urine.  Nausea.  Vomiting. DIAGNOSIS This condition may be diagnosed based on:  Medical history and physical exam.  Urine tests.  Blood tests. You may also have imaging tests of the kidneys, such as an ultrasound or CT scan. TREATMENT Treatment for this condition may depend on the severity of the infection.  If the infection is mild and is found early, you may be treated with antibiotic medicines taken by mouth. You will  need to drink fluids to remain hydrated.  If the infection is more severe, you may need to stay in the hospital and receive antibiotics given directly into a vein through an IV tube. You may also need to receive fluids through an IV tube if you are not able to remain hydrated. After your hospital stay, you may need to take oral antibiotics for a period of time. Other treatments may be required, depending on the cause of the infection. HOME CARE INSTRUCTIONS Medicines  Take over-the-counter and prescription medicines only as told by your health care provider.  If you were prescribed an antibiotic medicine, take it as told by your health care provider. Do not stop taking the antibiotic even if you start to feel better. General Instructions  Drink enough fluid to keep your urine clear or pale yellow.  Avoid caffeine, tea, and carbonated beverages. They tend to irritate the bladder.  Urinate often. Avoid holding in urine for long periods of time.  Urinate before and after sex.  After a bowel movement, women should cleanse from front to back. Use each tissue only once.  Keep all follow-up visits as told by your health care provider. This is important. SEEK MEDICAL CARE IF:  Your symptoms do not get better after 2 days of treatment.  Your symptoms get worse.  You have a fever. SEEK IMMEDIATE MEDICAL CARE IF:  You are unable to take your antibiotics or fluids.  You have shaking chills.  You vomit.  You have severe flank or back pain.  You have extreme weakness or fainting.   This information is not intended to replace advice given to you by your health care provider. Make sure you discuss any questions you have with your health care provider.   Document Released: 09/07/2005 Document Revised: 05/29/2015 Document Reviewed: 12/31/2014 Elsevier Interactive Patient Education [employer]2016 Elsevier Inc.

## 2016-01-12 LAB — URINE CULTURE: Culture: >=100000 — AB

## 2016-01-13 ENCOUNTER — Telehealth (HOSPITAL_BASED_OUTPATIENT_CLINIC_OR_DEPARTMENT_OTHER): Payer: Self-pay | Admitting: Emergency Medicine

## 2016-01-13 NOTE — Telephone Encounter (Signed)
Post ED Visit - Positive Culture Follow-up  Culture report reviewed by antimicrobial stewardship pharmacist:  []  [name], Pharm.D. []  [name], Pharm.D., BCPS []  [name], Pharm.D. []  [name], Pharm.D., BCPS []  [name], [address] BoulevardPharm.D., BCPS, AAHIVP []  [name], Pharm.D., BCPS, AAHIVP []  [name], Pharm.D. []  [name], [place][name] PharmD  Positive urine culture E. coli Treated with cephalexin, organism sensitive to the same and no further patient follow-up is required at this time.  [name], [name] 01/13/2016, 9:43 AM

## 2016-05-01 ENCOUNTER — Emergency Department (HOSPITAL_COMMUNITY)
Admission: EM | Admit: 2016-05-01 | Discharge: 2016-05-01 | Disposition: A | Discharge status: Home / Self Care | Payer: Self-pay | Attending: Emergency Medicine | Admitting: Emergency Medicine

## 2016-05-01 ENCOUNTER — Emergency Department (HOSPITAL_COMMUNITY): Payer: Self-pay

## 2016-05-01 ENCOUNTER — Encounter (HOSPITAL_COMMUNITY): Payer: Self-pay | Admitting: Emergency Medicine

## 2016-05-01 DIAGNOSIS — Z794 Long term (current) use of insulin: Secondary | ICD-10-CM | POA: Insufficient documentation

## 2016-05-01 DIAGNOSIS — R079 Chest pain, unspecified: Secondary | ICD-10-CM | POA: Insufficient documentation

## 2016-05-01 DIAGNOSIS — Z7984 Long term (current) use of oral hypoglycemic drugs: Secondary | ICD-10-CM | POA: Insufficient documentation

## 2016-05-01 DIAGNOSIS — E1140 Type 2 diabetes mellitus with diabetic neuropathy, unspecified: Secondary | ICD-10-CM | POA: Insufficient documentation

## 2016-05-01 DIAGNOSIS — R1013 Epigastric pain: Secondary | ICD-10-CM | POA: Insufficient documentation

## 2016-05-01 LAB — BASIC METABOLIC PANEL
Anion gap: 9 (ref 5–15)
BUN: 11 mg/dL (ref 6–20)
CALCIUM: 8.9 mg/dL (ref 8.9–10.3)
CHLORIDE: 101 mmol/L (ref 101–111)
CO2: 23 mmol/L (ref 22–32)
CREATININE: 0.5 mg/dL (ref 0.44–1.00)
GFR calc Af Amer: >60 mL/min (ref >60)
GFR calc non Af Amer: >60 mL/min (ref >60)
Glucose, Bld: 391 mg/dL — ABNORMAL HIGH (ref 65–99)
Potassium: 4.2 mmol/L (ref 3.5–5.1)
SODIUM: 133 mmol/L — AB (ref 135–145)

## 2016-05-01 LAB — CBC
HCT: 38.9 % (ref 36.0–46.0)
Hemoglobin: 13.9 g/dL (ref 12.0–15.0)
MCH: 29.6 pg (ref 26.0–34.0)
MCHC: 35.7 g/dL (ref 30.0–36.0)
MCV: 82.8 fL (ref 78.0–100.0)
PLATELETS: 220 10*3/uL (ref 150–400)
RBC: 4.7 MIL/uL (ref 3.87–5.11)
RDW: 13.2 % (ref 11.5–15.5)
WBC: 5.3 10*3/uL (ref 4.0–10.5)

## 2016-05-01 LAB — HEPATIC FUNCTION PANEL
ALBUMIN: 3.3 g/dL — AB (ref 3.5–5.0)
ALK PHOS: 70 U/L (ref 38–126)
ALT: 24 U/L (ref 14–54)
AST: 20 U/L (ref 15–41)
BILIRUBIN TOTAL: 0.6 mg/dL (ref 0.3–1.2)
Bilirubin, Direct: <0.1 mg/dL — ABNORMAL LOW (ref 0.1–0.5)
Total Protein: 6.8 g/dL (ref 6.5–8.1)

## 2016-05-01 LAB — I-STAT TROPONIN, ED
TROPONIN I, POC: 0 ng/mL (ref 0.00–0.08)
Troponin i, poc: 0 ng/mL (ref 0.00–0.08)

## 2016-05-01 LAB — LIPASE, BLOOD: Lipase: 28 U/L (ref 11–51)

## 2016-05-01 MED ORDER — INSULIN ASPART PROT & ASPART (70-30 MIX) 100 UNIT/ML ~~LOC~~ SUSP
25.0000 [IU] | Freq: Once | SUBCUTANEOUS | Status: DC
  Filled 2016-05-01: qty 10

## 2016-05-01 MED ORDER — PANTOPRAZOLE SODIUM 20 MG PO TBEC: 20.0000 mg | DELAYED_RELEASE_TABLET | Freq: Every day | ORAL | 0 refills | Status: DC

## 2016-05-01 MED ORDER — INSULIN ASPART PROT & ASPART (70-30 MIX) 100 UNIT/ML ~~LOC~~ SUSP
35.0000 [IU] | Freq: Once | SUBCUTANEOUS | Status: AC
  Administered 2016-05-01: 35 [IU] via SUBCUTANEOUS

## 2016-05-01 MED ORDER — PANTOPRAZOLE SODIUM 40 MG PO TBEC
40.0000 mg | DELAYED_RELEASE_TABLET | Freq: Once | ORAL | Status: AC
  Administered 2016-05-01: 40 mg via ORAL
  Filled 2016-05-01: qty 1

## 2016-05-01 MED ORDER — INSULIN ASPART PROT & ASPART (70-30 MIX) 100 UNIT/ML ~~LOC~~ SUSP: 25.0000 [IU] | Freq: Two times a day (BID) | SUBCUTANEOUS | Status: DC

## 2016-05-01 NOTE — ED Triage Notes (Signed)
Pt. Stated, I started having chest pain with chest pain and SOB that started 2 days ago. And I've been dizzsy, but I do have vertigo.

## 2016-05-01 NOTE — ED Provider Notes (Signed)
[facility]-EMERGENCY DEPT Provider Note   CSN: [id number] Arrival date & time: 05/01/16  [id number]  First Provider Contact:  First MD Initiated Contact with Patient 05/01/16 0820        History   Chief Complaint Chief Complaint  Patient presents with  . Chest Pain  . Shortness of Breath  . Dizziness    HPI [name] is a 35 y.o. female.  The history is provided by the patient and medical records. No language interpreter was used.  Chest Pain   Associated symptoms include cough, dizziness and shortness of breath. Pertinent negatives include no abdominal pain, no back pain, no fever, no headaches, no nausea, no palpitations and no vomiting.  Shortness of Breath  Associated symptoms include cough and chest pain. Pertinent negatives include no fever, no headaches, no neck pain, no vomiting, no abdominal pain, no rash and no leg swelling.  Dizziness  Associated symptoms: chest pain and shortness of breath   Associated symptoms: no headaches, no nausea, no palpitations and no vomiting    Nicole GrillsJessica Dunlap is a 35 y.o. female  with a PMH of DM, HLD who presents to the Emergency Department complaining of worsening, constant, squeezing central chest pain x 2 days. Pain is worse with eating and she states that she has not eaten in 24 hours 2/2 pain. She denies any difficulty swallowing, however does state that she feels the central chest pain when swallowing. Associated symptoms include shortness of breath that is worse with taking a deep breath and lying down. Also endorses dry cough over the last several weeks. OTC pain relief medications taken with little improvement. Denies abdominal pain, n/v/d, back pain, fever. Taking DM medications as directed, however did not take last night's dose because she did not eat all day. Also has not taken morning dose.  Past Medical History:  Diagnosis Date  . Anemia   . Carpal tunnel syndrome   . Depression   . Diabetes mellitus without complication (HCC)     . Diabetic neuropathy (HCC)   . Hemoglobin C trait (HCC)   . High cholesterol     There are no active problems to display for this patient.   Past Surgical History:  Procedure Laterality Date  . CESAREAN SECTION    . MOUTH SURGERY    . TUBAL LIGATION      OB History    No data available       Home Medications    Prior to Admission medications   Medication Sig Start Date End Date Taking? Authorizing Provider  acetaminophen (TYLENOL) 650 MG CR tablet Take 1,300 mg by mouth every 8 (eight) hours as needed for pain.   Yes Historical Provider, MD  ferrous sulfate 325 (65 FE) MG EC tablet Take 325 mg by mouth daily.   Yes Historical Provider, MD  metFORMIN (GLUCOPHAGE) 500 MG tablet Take 500 mg by mouth 2 (two) times daily with a meal.   Yes Historical Provider, MD  naproxen (NAPROSYN) 500 MG tablet Take 1 tablet (500 mg total) by mouth 2 (two) times daily as needed for mild pain, moderate pain or headache (TAKE WITH MEALS.). 05/05/15  Yes Mercedes Camprubi-Soms, PA-C  insulin aspart protamine- aspart (NOVOLOG MIX 70/30) (70-30) 100 UNIT/ML injection Inject 25-35 Units into the skin 2 (two) times daily with a meal. Takes 35 units in the morning and 25 units in the evening    Historical Provider, MD  pantoprazole (PROTONIX) 20 MG tablet Take 1 tablet (20 mg total) by  mouth daily. 05/01/16   Chase Picket Ryker Pherigo, PA-C    Family History Family History  Problem Relation Age of Onset  . Diabetes Mother   . Hypertension Mother   . Hyperlipidemia Mother   . Cancer Father     Social History Social History  Substance Use Topics  . Smoking status: Never Smoker  . Smokeless tobacco: Never Used  . Alcohol use No     Allergies   Magnesium-containing compounds and Tramadol   Review of Systems Review of Systems  Constitutional: Negative for chills and fever.  HENT: Negative for congestion.   Eyes: Negative for visual disturbance.  Respiratory: Positive for cough and shortness of  breath.   Cardiovascular: Positive for chest pain. Negative for palpitations and leg swelling.  Gastrointestinal: Negative for abdominal pain, nausea and vomiting.  Genitourinary: Negative for dysuria.  Musculoskeletal: Negative for back pain and neck pain.  Skin: Negative for rash.  Neurological: Positive for dizziness. Negative for headaches.     Physical Exam Updated Vital Signs BP 103/71   Pulse 92   Temp 98.9 F (37.2 C) (Oral)   Resp 19   Ht  (1.6 m)   Wt 91.2 kg   LMP 04/11/2016   SpO2 100%   BMI 35.62 kg/m   Physical Exam  Constitutional: She is oriented to person, place, and time. She appears well-developed and well-nourished. No distress.  HENT:  Head: Normocephalic and atraumatic.  Cardiovascular: Normal rate, regular rhythm, normal heart sounds and intact distal pulses.  Exam reveals no gallop and no friction rub.   No murmur heard. Pulmonary/Chest: Effort normal and breath sounds normal. No respiratory distress. She has no wheezes. She has no rales. She exhibits tenderness.    Abdominal: Soft. Bowel sounds are normal. She exhibits no distension. There is tenderness (Epigastric).  Musculoskeletal: She exhibits no edema.  Neurological: She is alert and oriented to person, place, and time.  Skin: Skin is warm and dry.  Nursing note and vitals reviewed.    ED Treatments / Results  Labs (all labs ordered are listed, but only abnormal results are displayed) Labs Reviewed  BASIC METABOLIC PANEL - Abnormal; Notable for the following:       Result Value   Sodium 133 (*)    Glucose, Bld 391 (*)    All other components within normal limits  HEPATIC FUNCTION PANEL - Abnormal; Notable for the following:    Albumin 3.3 (*)    Bilirubin, Direct <0.1 (*)    All other components within normal limits  CBC  LIPASE, BLOOD  I-STAT TROPOININ, ED  I-STAT TROPOININ, ED    EKG  EKG Interpretation  Date/Time:  Friday May 01 2016 07:35:56 EDT Ventricular  Rate:  98 PR Interval:  150 QRS Duration: 72 QT Interval:  336 QTC Calculation: 428 R Axis:   92 Text Interpretation:  Normal sinus rhythm Rightward axis Low voltage QRS Borderline ECG No significant change since last tracing Confirmed by ISAACS MD, Sheria Lang [phone]) on 05/01/2016 7:43:59 AM Also confirmed by Erma Heritage MD, CAMERON [phone]), editor Lebam, Cala Bradford [phone])  on 05/01/2016 8:14:06 AM       Radiology Dg Chest 2 View  Result Date: 05/01/2016 CLINICAL DATA:  Chest pain and shortness of breath for 2 days EXAM: CHEST  2 VIEW COMPARISON:  08/25/2014 FINDINGS: The heart size and mediastinal contours are within normal limits. Both lungs are clear. The visualized skeletal structures are unremarkable. IMPRESSION: No active cardiopulmonary disease. Electronically Signed   By:  Alcide Clever M.D.   On: 05/01/2016 08:20    Procedures Procedures (including critical care time)  Medications Ordered in ED Medications  pantoprazole (PROTONIX) EC tablet 40 mg (40 mg Oral Given 05/01/16 0850)  insulin aspart protamine- aspart (NOVOLOG MIX 70/30) injection 35 Units (35 Units Subcutaneous Given 05/01/16 0948)     Initial Impression / Assessment and Plan / ED Course  I have reviewed the triage vital signs and the nursing notes.  Pertinent labs & imaging results that were available during my care of the patient were reviewed by me and considered in my medical decision making (see chart for details).  Clinical Course  Value Comment By Time  EKG 12-Lead (Reviewed) Shaune Pollack, MD 08/11 [phone]   [name] is a 35 y.o. female with PMH of DM and HLD who presents to ED for worsening constant central squeezing chest pain x 2 days. Heart score of 2. PERC negative. On exam, afebrile and hemodynamically stable. TTP of epigastrium. Low suspicion for ACS/PE. Findings more c/w gastritis vs gerd etiology. Labs, ekg, cxr. Will give protonix and reassess.   Patient reevaluated and feels improved after  protonix.  Labs reviewed. Glucose elevated - patient states this is her normal range. No gap. DKA unlikely.  EKG reassuring. Chest x-ray with no acute cardiopulmonary disease.  Repeat troponin wdl.   Patient is to be discharged with recommendation to follow up with PCP in regards to today's hospital visit. Patient's symptoms unlikely to be of CAD etiology. Pt has been advised return to the ED if develop any exertional chest pain- strict return precautions discussed and all questions answered. Will start on Protonix.  Patient appears reliable for follow up and is agreeable to plan as dictated above.   Patient seen by and discussed with Dr. Erma Heritage who agrees with treatment plan.   Final Clinical Impressions(s) / ED Diagnoses   Final diagnoses:  Epigastric pain    New Prescriptions Discharge Medication List as of 05/01/2016 12:50 PM    START taking these medications   Details  pantoprazole (PROTONIX) 20 MG tablet Take 1 tablet (20 mg total) by mouth daily., Starting Fri 05/01/2016, Print         [facility] Nicole Turpen, PA-C 05/01/16 1442    Shaune Pollack, MD 05/03/16 [phone]

## 2016-05-01 NOTE — Discharge Instructions (Signed)
Take Protonix daily. Follow up with your primary care physician for discussion of your diagnoses and further evaluation after today's visit. Please return to the ER for new or worsening symptoms, any additional concerns.

## 2016-07-06 ENCOUNTER — Encounter (HOSPITAL_COMMUNITY): Payer: Self-pay | Admitting: Emergency Medicine

## 2016-07-06 ENCOUNTER — Emergency Department (HOSPITAL_COMMUNITY)
Admission: EM | Admit: 2016-07-06 | Discharge: 2016-07-07 | Disposition: A | Discharge status: Home / Self Care | Payer: Self-pay | Attending: Emergency Medicine | Admitting: Emergency Medicine

## 2016-07-06 DIAGNOSIS — Z794 Long term (current) use of insulin: Secondary | ICD-10-CM | POA: Insufficient documentation

## 2016-07-06 DIAGNOSIS — E1140 Type 2 diabetes mellitus with diabetic neuropathy, unspecified: Secondary | ICD-10-CM | POA: Insufficient documentation

## 2016-07-06 DIAGNOSIS — M25551 Pain in right hip: Secondary | ICD-10-CM | POA: Insufficient documentation

## 2016-07-06 NOTE — ED Triage Notes (Signed)
Pt states that she has had R sided hip pain x 1 month. Denies injury. Worse with standing. Alert and oriented.

## 2016-07-07 ENCOUNTER — Emergency Department (HOSPITAL_COMMUNITY): Payer: Self-pay

## 2016-07-07 LAB — POC URINE PREG, ED: PREG TEST UR: NEGATIVE

## 2016-07-07 MED ORDER — PREDNISONE 20 MG PO TABS
60.0000 mg | ORAL_TABLET | Freq: Once | ORAL | Status: AC
  Administered 2016-07-07: 60 mg via ORAL
  Filled 2016-07-07: qty 3

## 2016-07-07 MED ORDER — HYDROCODONE-ACETAMINOPHEN 5-325 MG PO TABS
1.0000 | ORAL_TABLET | Freq: Once | ORAL | Status: AC
  Administered 2016-07-07: 1 via ORAL
  Filled 2016-07-07: qty 1

## 2016-07-07 MED ORDER — PREDNISONE 20 MG PO TABS: 40.0000 mg | ORAL_TABLET | Freq: Every day | ORAL | 0 refills | Status: DC

## 2016-07-07 MED ORDER — HYDROCODONE-ACETAMINOPHEN 5-325 MG PO TABS: 1.0000 | ORAL_TABLET | Freq: Four times a day (QID) | ORAL | 0 refills | Status: DC | PRN

## 2016-07-07 NOTE — ED Notes (Signed)
Pt reports R pelvic pain x 1.5 months.  States she has contacted her doctor and was told to take pain meds, and was given vicodin without relief.  Pt reports pain is worse when laying on her R hip.  Pt is ambulatory without difficulty

## 2016-08-19 NOTE — ED Provider Notes (Signed)
[facility]-EMERGENCY DEPT Provider Note   CSN: [id number] Arrival date & time: 07/06/16  2303     History   Chief Complaint Chief Complaint  Patient presents with  . Hip Pain    HPI Nicole GrillsJessica Dunlap is a 35 y.o. female.  HPI   Pt is a 35 y/o female presents to the ER with complaint of 1 to 1 1/2 months of right hip pain that has been constant and gradually worsening.  She reports gradual onset, w/o injury or strain.  She has seen her regular doctor and states she was given pain medications w/o any improvement.  Pain aching and intermittently sharp, rated 7/10, located in right hip with radiation to groin, is worse with standing long periods of time or laying on her right side.  She denies weakness, swelling, redness.  She denies abdominal pain, dysuria, hematuria, vaginal discharge, flank pain, fever, N, V.    Past Medical History:  Diagnosis Date  . Anemia   . Carpal tunnel syndrome   . Depression   . Diabetes mellitus without complication (HCC)   . Diabetic neuropathy (HCC)   . Hemoglobin C trait (HCC)   . High cholesterol     There are no active problems to display for this patient.   Past Surgical History:  Procedure Laterality Date  . CESAREAN SECTION    . MOUTH SURGERY    . TUBAL LIGATION      OB History    No data available       Home Medications    Prior to Admission medications   Medication Sig Start Date End Date Taking? Authorizing Provider  acetaminophen (TYLENOL) 650 MG CR tablet Take 1,300 mg by mouth every 8 (eight) hours as needed for pain.    Historical Provider, MD  ferrous sulfate 325 (65 FE) MG EC tablet Take 325 mg by mouth daily.    Historical Provider, MD  HYDROcodone-acetaminophen (NORCO/VICODIN) 5-325 MG tablet Take 1 tablet by mouth every 6 (six) hours as needed. 07/07/16   Danelle BerryLeisa Tanner Yeley, PA-C  insulin aspart protamine- aspart (NOVOLOG MIX 70/30) (70-30) 100 UNIT/ML injection Inject 25-35 Units into the skin 2 (two) times daily with a meal.  Takes 35 units in the morning and 25 units in the evening    Historical Provider, MD  metFORMIN (GLUCOPHAGE) 500 MG tablet Take 500 mg by mouth 2 (two) times daily with a meal.    Historical Provider, MD  naproxen (NAPROSYN) 500 MG tablet Take 1 tablet (500 mg total) by mouth 2 (two) times daily as needed for mild pain, moderate pain or headache (TAKE WITH MEALS.). 05/05/15   Mercedes Camprubi-Soms, PA-C  pantoprazole (PROTONIX) 20 MG tablet Take 1 tablet (20 mg total) by mouth daily. 05/01/16   Chase PicketJaime Pilcher Ward, PA-C  predniSONE (DELTASONE) 20 MG tablet Take 2 tablets (40 mg total) by mouth daily. Take 40 mg by mouth daily for 3 days, then 20mg  by mouth daily for 3 days, then 10mg  daily for 3 days 07/07/16   Danelle BerryLeisa Obryan Radu, PA-C    Family History Family History  Problem Relation Age of Onset  . Diabetes Mother   . Hypertension Mother   . Hyperlipidemia Mother   . Cancer Father     Social History Social History  Substance Use Topics  . Smoking status: Never Smoker  . Smokeless tobacco: Never Used  . Alcohol use No     Allergies   Magnesium-containing compounds and Tramadol   Review of Systems Review of Systems  All other systems reviewed and are negative.    Physical Exam Updated Vital Signs BP 100/80 (BP Location: Left Arm)   Pulse 100   Temp 97.7 F (36.5 C) (Oral)   Resp 17   Ht 5\' 3"  (1.6 m)   Wt 93 kg   LMP 07/03/2016 Comment: neg preg test  SpO2 98%   BMI 36.33 kg/m   Physical Exam  Constitutional: She is oriented to person, place, and time. She appears well-developed and well-nourished. No distress.  HENT:  Head: Normocephalic and atraumatic.  Right Ear: External ear normal.  Left Ear: External ear normal.  Nose: Nose normal.  Mouth/Throat: Oropharynx is clear and moist. No oropharyngeal exudate.  Eyes: Conjunctivae and EOM are normal. Pupils are equal, round, and reactive to light. Right eye exhibits no discharge. Left eye exhibits no discharge. No  scleral icterus.  Neck: Normal range of motion. Neck supple. No JVD present. No tracheal deviation present.  Cardiovascular: Normal rate, regular rhythm and intact distal pulses.   Pulmonary/Chest: Effort normal and breath sounds normal. No stridor. No respiratory distress.  Abdominal: Soft. Bowel sounds are normal. She exhibits no distension and no mass. There is no tenderness. There is no rebound and no guarding.  Musculoskeletal: Normal range of motion. She exhibits no edema or deformity.       Right hip: She exhibits normal range of motion, normal strength, no tenderness, no bony tenderness and no crepitus.  Lymphadenopathy:    She has no cervical adenopathy.  Neurological: She is alert and oriented to person, place, and time. She exhibits normal muscle tone. Coordination normal.  Skin: Skin is warm and dry. No rash noted. She is not diaphoretic. No erythema. No pallor.  Psychiatric: She has a normal mood and affect. Her behavior is normal. Judgment and thought content normal.  Nursing note and vitals reviewed.    ED Treatments / Results  Labs (all labs ordered are listed, but only abnormal results are displayed) Labs Reviewed  POC URINE PREG, ED    EKG  EKG Interpretation None      Results for orders placed or performed during the hospital encounter of 07/06/16  POC Urine Pregnancy, ED ([facility])  Result Value Ref Range   Preg Test, Ur NEGATIVE NEGATIVE   No results found.  Radiology CLINICAL DATA:  Painful right hip over the past month.  EXAM: DG HIP (WITH OR WITHOUT PELVIS) 2-3V RIGHT  COMPARISON:  None.  FINDINGS: There is no evidence of hip fracture or dislocation. Small acetabular ossicles bilaterally. Sacroiliac joints are symmetric in appearance without abnormal fusion or erosion. Pubic symphysis appears intact. Visualized lumbar spine is unremarkable. Overlying bowel obscures the mid to lower sacrum and coccyx. There is no evidence of  arthropathy or other focal bone abnormality.  IMPRESSION: Negative.   Electronically Signed   By: Tollie Ethavid  Kwon M.D.   On: 07/07/2016 02:11   Procedures Procedures (including critical care time)  Medications Ordered in ED Medications  predniSONE (DELTASONE) tablet 60 mg (60 mg Oral Given 07/07/16 0201)  HYDROcodone-acetaminophen (NORCO/VICODIN) 5-325 MG per tablet 1 tablet (1 tablet Oral Given 07/07/16 0202)     Initial Impression / Assessment and Plan / ED Course  I have reviewed the triage vital signs and the nursing notes.  Pertinent labs & imaging results that were available during my care of the patient were reviewed by me and considered in my medical decision making (see chart for details).  Clinical Course  Right hip pain for 1.5 months, no injury, unimproved with Vicodin per pt, exam unremarkable, Xray negative, pt ambulatory.  Will tx with steroid burst and pain medications, encouraged RICE tx and PCP follow up as needed.  Final Clinical Impressions(s) / ED Diagnoses   Final diagnoses:  Right hip pain    New Prescriptions Discharge Medication List as of 07/07/2016  2:07 AM    START taking these medications   Details  HYDROcodone-acetaminophen (NORCO/VICODIN) 5-325 MG tablet Take 1 tablet by mouth every 6 (six) hours as needed., Starting Tue 07/07/2016, Print    predniSONE (DELTASONE) 20 MG tablet Take 2 tablets (40 mg total) by mouth daily. Take 40 mg by mouth daily for 3 days, then 20mg  by mouth daily for 3 days, then 10mg  daily for 3 days, Starting Tue 07/07/2016, Print         Danelle Berry, PA-C 08/19/16 1837    Geoffery Lyons, MD 08/20/16 [phone]

## 2016-10-03 ENCOUNTER — Encounter (HOSPITAL_COMMUNITY): Payer: Self-pay

## 2016-10-03 ENCOUNTER — Emergency Department (HOSPITAL_COMMUNITY)
Admission: EM | Admit: 2016-10-03 | Discharge: 2016-10-03 | Disposition: A | Discharge status: Home / Self Care | Payer: BLUE CROSS/BLUE SHIELD | Attending: Emergency Medicine | Admitting: Emergency Medicine

## 2016-10-03 ENCOUNTER — Emergency Department (HOSPITAL_COMMUNITY): Payer: BLUE CROSS/BLUE SHIELD

## 2016-10-03 DIAGNOSIS — Z79899 Other long term (current) drug therapy: Secondary | ICD-10-CM | POA: Diagnosis not present

## 2016-10-03 DIAGNOSIS — Z794 Long term (current) use of insulin: Secondary | ICD-10-CM | POA: Diagnosis not present

## 2016-10-03 DIAGNOSIS — N3001 Acute cystitis with hematuria: Secondary | ICD-10-CM | POA: Insufficient documentation

## 2016-10-03 DIAGNOSIS — E1140 Type 2 diabetes mellitus with diabetic neuropathy, unspecified: Secondary | ICD-10-CM | POA: Diagnosis not present

## 2016-10-03 DIAGNOSIS — R1032 Left lower quadrant pain: Secondary | ICD-10-CM | POA: Diagnosis present

## 2016-10-03 DIAGNOSIS — R109 Unspecified abdominal pain: Secondary | ICD-10-CM

## 2016-10-03 LAB — COMPREHENSIVE METABOLIC PANEL
ALK PHOS: 64 U/L (ref 38–126)
ALT: 19 U/L (ref 14–54)
ANION GAP: 7 (ref 5–15)
AST: 16 U/L (ref 15–41)
Albumin: 3.5 g/dL (ref 3.5–5.0)
BUN: 14 mg/dL (ref 6–20)
CALCIUM: 8.9 mg/dL (ref 8.9–10.3)
CO2: 26 mmol/L (ref 22–32)
CREATININE: 0.64 mg/dL (ref 0.44–1.00)
Chloride: 101 mmol/L (ref 101–111)
Glucose, Bld: 493 mg/dL — ABNORMAL HIGH (ref 65–99)
Potassium: 4.1 mmol/L (ref 3.5–5.1)
SODIUM: 134 mmol/L — AB (ref 135–145)
Total Bilirubin: 0.7 mg/dL (ref 0.3–1.2)
Total Protein: 6.8 g/dL (ref 6.5–8.1)

## 2016-10-03 LAB — CBC
HCT: 36.7 % (ref 36.0–46.0)
HEMOGLOBIN: 13.2 g/dL (ref 12.0–15.0)
MCH: 28.8 pg (ref 26.0–34.0)
MCHC: 36 g/dL (ref 30.0–36.0)
MCV: 80.1 fL (ref 78.0–100.0)
Platelets: 239 10*3/uL (ref 150–400)
RBC: 4.58 MIL/uL (ref 3.87–5.11)
RDW: 12.9 % (ref 11.5–15.5)
WBC: 4.5 10*3/uL (ref 4.0–10.5)

## 2016-10-03 LAB — URINALYSIS, ROUTINE W REFLEX MICROSCOPIC
BILIRUBIN URINE: NEGATIVE
Bacteria, UA: NONE SEEN
KETONES UR: 5 mg/dL — AB
NITRITE: NEGATIVE
PH: 6 (ref 5.0–8.0)
PROTEIN: NEGATIVE mg/dL
Specific Gravity, Urine: 1.031 — ABNORMAL HIGH (ref 1.005–1.030)

## 2016-10-03 LAB — LIPASE, BLOOD: LIPASE: 21 U/L (ref 11–51)

## 2016-10-03 LAB — CBG MONITORING, ED: Glucose-Capillary: 332 mg/dL — ABNORMAL HIGH (ref 65–99)

## 2016-10-03 LAB — POC URINE PREG, ED: Preg Test, Ur: NEGATIVE

## 2016-10-03 MED ORDER — KETOROLAC TROMETHAMINE 30 MG/ML IJ SOLN
30.0000 mg | Freq: Once | INTRAMUSCULAR | Status: AC
  Administered 2016-10-03: 30 mg via INTRAVENOUS
  Filled 2016-10-03: qty 1

## 2016-10-03 MED ORDER — CEPHALEXIN 500 MG PO CAPS: 500.0000 mg | ORAL_CAPSULE | Freq: Two times a day (BID) | ORAL | 0 refills | Status: DC

## 2016-10-03 MED ORDER — SODIUM CHLORIDE 0.9 % IV BOLUS (SEPSIS)
1000.0000 mL | Freq: Once | INTRAVENOUS | Status: AC
  Administered 2016-10-03: 1000 mL via INTRAVENOUS

## 2016-10-03 NOTE — ED Triage Notes (Signed)
Pt states that around 830 this morning, she began to feel a sharp pressure in the LLQ of her abdomen. Denies N/V/D. Endorses dark urine. A&Ox4. Ambulatory.

## 2016-10-03 NOTE — ED Provider Notes (Signed)
[facility]ER[facility] DEPT Provider Note   CSN: [id number] Arrival date & time: 10/03/16  1434     History   Chief Complaint Chief Complaint  Patient presents with  . Abdominal Pain    HPI [name] is a 36 y.o. female.  The history is provided by the patient and medical records.  Abdominal Pain     36 year old female with history of anemia, depression, diabetes, neuropathy, hyperlipidemia, presenting to the ED for left lower abdominal pain. Patient reports she was at the grocery store earlier today and while walking around she developed sudden onset of sharp, left lower abdominal pain. States it feels as if someone is sticking a knife in her and twisting it. States she went home and took some of her tramadol but has not had any improvement. Pain is nonradiating. No associated nausea, vomiting, or diarrhea. She has noticed some dark colored urine after onset of pain. No dysuria or frequency.  No fever or chills.  Does report hx of kidney stones several years ago.  States CBG has been elevated at home.  States her PCP just adjusted her medications yesterday because of this.  Past Medical History:  Diagnosis Date  . Anemia   . Carpal tunnel syndrome   . Depression   . Diabetes mellitus without complication (HCC)   . Diabetic neuropathy (HCC)   . Hemoglobin C trait (HCC)   . High cholesterol     There are no active problems to display for this patient.   Past Surgical History:  Procedure Laterality Date  . CESAREAN SECTION    . MOUTH SURGERY    . TUBAL LIGATION      OB History    No data available       Home Medications    Prior to Admission medications   Medication Sig Start Date End Date Taking? Authorizing Provider  acetaminophen (TYLENOL) 650 MG CR tablet Take 1,300 mg by mouth every 8 (eight) hours as needed for pain.    Historical Provider, MD  ferrous sulfate 325 (65 FE) MG EC tablet Take 325 mg by mouth daily.    Historical Provider, MD    HYDROcodone-acetaminophen (NORCO/VICODIN) 5-325 MG tablet Take 1 tablet by mouth every 6 (six) hours as needed. 07/07/16   Danelle Berry, PA-C  insulin aspart protamine- aspart (NOVOLOG MIX 70/30) (70-30) 100 UNIT/ML injection Inject 25-35 Units into the skin 2 (two) times daily with a meal. Takes 35 units in the morning and 25 units in the evening    Historical Provider, MD  metFORMIN (GLUCOPHAGE) 500 MG tablet Take 500 mg by mouth 2 (two) times daily with a meal.    Historical Provider, MD  naproxen (NAPROSYN) 500 MG tablet Take 1 tablet (500 mg total) by mouth 2 (two) times daily as needed for mild pain, moderate pain or headache (TAKE WITH MEALS.). 05/05/15   Mercedes [address], PA-C  pantoprazole (PROTONIX) 20 MG tablet Take 1 tablet (20 mg total) by mouth daily. 05/01/16   Chase Picket Ward, PA-C  predniSONE (DELTASONE) 20 MG tablet Take 2 tablets (40 mg total) by mouth daily. Take 40 mg by mouth daily for 3 days, then 20mg  by mouth daily for 3 days, then 10mg  daily for 3 days 07/07/16   Danelle Berry, PA-C    Family History Family History  Problem Relation Age of Onset  . Diabetes Mother   . Hypertension Mother   . Hyperlipidemia Mother   . Cancer Father     Social History  Social History  Substance Use Topics  . Smoking status: Never Smoker  . Smokeless tobacco: Never Used  . Alcohol use No     Allergies   Magnesium-containing compounds and Tramadol   Review of Systems Review of Systems  Gastrointestinal: Positive for abdominal pain.  Genitourinary:       +dark colored urine  All other systems reviewed and are negative.    Physical Exam Updated Vital Signs BP 130/87   Pulse 92   Temp 97.4 F (36.3 C)   Resp 16   SpO2 100%   Physical Exam  Constitutional: She is oriented to person, place, and time. She appears well-developed and well-nourished.  HENT:  Head: Normocephalic and atraumatic.  Mouth/Throat: Oropharynx is clear and moist.  Eyes: Conjunctivae and  EOM are normal. Pupils are equal, round, and reactive to light.  Neck: Normal range of motion.  Cardiovascular: Normal rate, regular rhythm and normal heart sounds.   Pulmonary/Chest: Effort normal and breath sounds normal.  Abdominal: Soft. Bowel sounds are normal. There is tenderness in the left lower quadrant.    Musculoskeletal: Normal range of motion.  Neurological: She is alert and oriented to person, place, and time.  Skin: Skin is warm and dry.  Psychiatric: She has a normal mood and affect.  Nursing note and vitals reviewed.    ED Treatments / Results  Labs (all labs ordered are listed, but only abnormal results are displayed) Labs Reviewed  COMPREHENSIVE METABOLIC PANEL - Abnormal; Notable for the following:       Result Value   Sodium 134 (*)    Glucose, Bld 493 (*)    All other components within normal limits  URINALYSIS, ROUTINE W REFLEX MICROSCOPIC - Abnormal; Notable for the following:    APPearance HAZY (*)    Specific Gravity, Urine 1.031 (*)    Glucose, UA >=500 (*)    Hgb urine dipstick LARGE (*)    Ketones, ur 5 (*)    Leukocytes, UA TRACE (*)    Squamous Epithelial / LPF 0-5 (*)    All other components within normal limits  CBG MONITORING, ED - Abnormal; Notable for the following:    Glucose-Capillary 332 (*)    All other components within normal limits  LIPASE, BLOOD  CBC  POC URINE PREG, ED    EKG  EKG Interpretation None       Radiology Ct Renal Stone Study  Result Date: 10/03/2016 CLINICAL DATA:  Left lower quadrant pressure starting this morning. EXAM: CT ABDOMEN AND PELVIS WITHOUT CONTRAST TECHNIQUE: Multidetector CT imaging of the abdomen and pelvis was performed following the standard protocol without IV contrast. COMPARISON:  11/16/2014 FINDINGS: Lower chest:  Unremarkable. Hepatobiliary: No focal abnormality in the liver on this study without intravenous contrast. There is no evidence for gallstones, gallbladder wall thickening, or  pericholecystic fluid. No intrahepatic or extrahepatic biliary dilation. Pancreas: No focal mass lesion. No dilatation of the main duct. No intraparenchymal cyst. No peripancreatic edema. Spleen: No splenomegaly. No focal mass lesion. Adrenals/Urinary Tract: No adrenal nodule or mass. Kidneys are unremarkable. No evidence for hydroureter. Bladder is moderately distended. Stomach/Bowel: Stomach is nondistended. No gastric wall thickening. No evidence of outlet obstruction. Duodenum is normally positioned as is the ligament of Treitz. No small bowel wall thickening. No small bowel dilatation. The terminal ileum is normal. The appendix is normal. No gross colonic mass. No colonic wall thickening. No substantial diverticular change. Vascular/Lymphatic: No abdominal aortic aneurysm. There is no gastrohepatic or hepatoduodenal  ligament lymphadenopathy. No intraperitoneal or retroperitoneal lymphadenopathy. No pelvic sidewall lymphadenopathy. Reproductive: Uterus unremarkable.  There is no adnexal mass. Other: No intraperitoneal free fluid. Musculoskeletal: The apparent complex fluid collection in the subcutaneous fat of the lower anterior abdominal wall is again noted, measuring 2.9 x 3.7 cm today compared to 2.7 x 3.5 cm previously. IMPRESSION: 1. No acute findings in the abdomen or pelvis. Specifically, no findings to explain the patient's history of left lower quadrant pressure. Electronically Signed   By: Kennith CenterEric  Mansell M.D.   On: 10/03/2016 16:38    Procedures Procedures (including critical care time)  Medications Ordered in ED Medications  ketorolac (TORADOL) 30 MG/ML injection 30 mg (30 mg Intravenous Given 10/03/16 1610)  sodium chloride 0.9 % bolus 1,000 mL (0 mLs Intravenous Stopped 10/03/16 1744)     Initial Impression / Assessment and Plan / ED Course  I have reviewed the triage vital signs and the nursing notes.  Pertinent labs & imaging results that were available during my care of the patient  were reviewed by me and considered in my medical decision making (see chart for details).  Clinical Course    36 year old female here with left lower abdominal pain of sudden onset today while grocery shopping. She is afebrile and nontoxic. Mild tenderness of the left lateral abdomen without noted radiation. Labwork with., but no evidence of DKA patient has a normal anion gap and normal bicarb.  CT scan without acute findings. UA with TNTC RBC's and WBC's.  There is mention of fluid collection in the lower subcutaneous fat of the left lower abdominal wall, however this is been present since 2015 and I have low suspicion for acute abscess. There are no overlying changes. Patient was given IV fluids here with improvement of her glucose to 330.  She did report to me that her PCP just adjusted her insulin dosing yesterday due to recurrent hyperglycemia.  Will have her monitor this closely at home.  Will start on keflex for likely developing UTI.  Will have her follow-up closely with her PCP.  Discussed plan with patient, she acknowledged understanding and agreed with plan of care.  Return precautions given for new or worsening symptoms.  Final Clinical Impressions(s) / ED Diagnoses   Final diagnoses:  Left lateral abdominal pain  Acute cystitis with hematuria    New Prescriptions New Prescriptions   CEPHALEXIN (KEFLEX) 500 MG CAPSULE    Take 1 capsule (500 mg total) by mouth 2 (two) times daily.     Garlon HatchetLisa M Marqus Macphee, PA-C 10/03/16 1853    Nelva Nayobert Beaton, MD 10/04/16 1350

## 2016-10-03 NOTE — Discharge Instructions (Signed)
Take the prescribed medication as directed.  Make sure to watch your blood sugar at home as it was elevated here. Follow-up with your primary care doctor. Return to the ED for new or worsening symptoms.

## 2016-10-03 NOTE — ED Notes (Signed)
Pt has urine sample in triage. 

## 2017-01-11 ENCOUNTER — Emergency Department (HOSPITAL_COMMUNITY): Payer: BLUE CROSS/BLUE SHIELD

## 2017-01-11 ENCOUNTER — Encounter (HOSPITAL_COMMUNITY): Payer: Self-pay | Admitting: Emergency Medicine

## 2017-01-11 ENCOUNTER — Emergency Department (HOSPITAL_COMMUNITY)
Admission: EM | Admit: 2017-01-11 | Discharge: 2017-01-11 | Disposition: A | Discharge status: Home / Self Care | Payer: BLUE CROSS/BLUE SHIELD | Attending: Emergency Medicine | Admitting: Emergency Medicine

## 2017-01-11 DIAGNOSIS — E1140 Type 2 diabetes mellitus with diabetic neuropathy, unspecified: Secondary | ICD-10-CM | POA: Diagnosis not present

## 2017-01-11 DIAGNOSIS — Z79899 Other long term (current) drug therapy: Secondary | ICD-10-CM | POA: Insufficient documentation

## 2017-01-11 DIAGNOSIS — R51 Headache: Secondary | ICD-10-CM | POA: Diagnosis not present

## 2017-01-11 DIAGNOSIS — R519 Headache, unspecified: Secondary | ICD-10-CM

## 2017-01-11 DIAGNOSIS — M779 Enthesopathy, unspecified: Secondary | ICD-10-CM | POA: Diagnosis not present

## 2017-01-11 DIAGNOSIS — Z794 Long term (current) use of insulin: Secondary | ICD-10-CM | POA: Insufficient documentation

## 2017-01-11 DIAGNOSIS — E1165 Type 2 diabetes mellitus with hyperglycemia: Secondary | ICD-10-CM

## 2017-01-11 LAB — HCG, QUANTITATIVE, PREGNANCY: hCG, Beta Chain, Quant, S: <1 m[IU]/mL (ref <5)

## 2017-01-11 LAB — CBC
HEMATOCRIT: 34.6 % — AB (ref 36.0–46.0)
Hemoglobin: 12.4 g/dL (ref 12.0–15.0)
MCH: 28.7 pg (ref 26.0–34.0)
MCHC: 35.8 g/dL (ref 30.0–36.0)
MCV: 80.1 fL (ref 78.0–100.0)
PLATELETS: 204 10*3/uL (ref 150–400)
RBC: 4.32 MIL/uL (ref 3.87–5.11)
RDW: 12.8 % (ref 11.5–15.5)
WBC: 4.8 10*3/uL (ref 4.0–10.5)

## 2017-01-11 LAB — CBG MONITORING, ED: Glucose-Capillary: 280 mg/dL — ABNORMAL HIGH (ref 65–99)

## 2017-01-11 MED ORDER — INSULIN ASPART 100 UNIT/ML ~~LOC~~ SOLN
10.0000 [IU] | Freq: Once | SUBCUTANEOUS | Status: AC
  Administered 2017-01-11: 10 [IU] via INTRAVENOUS
  Filled 2017-01-11: qty 1

## 2017-01-11 MED ORDER — KETOROLAC TROMETHAMINE 30 MG/ML IJ SOLN
30.0000 mg | Freq: Once | INTRAMUSCULAR | Status: AC
  Administered 2017-01-11: 30 mg via INTRAVENOUS
  Filled 2017-01-11: qty 1

## 2017-01-11 MED ORDER — METOCLOPRAMIDE HCL 5 MG/ML IJ SOLN
10.0000 mg | Freq: Once | INTRAMUSCULAR | Status: AC
  Administered 2017-01-11: 5 mg via INTRAVENOUS
  Filled 2017-01-11: qty 2

## 2017-01-11 MED ORDER — SODIUM CHLORIDE 0.9 % IV SOLN
1000.0000 mL | Freq: Once | INTRAVENOUS | Status: AC
  Administered 2017-01-11: 1000 mL via INTRAVENOUS

## 2017-01-11 MED ORDER — DIPHENHYDRAMINE HCL 50 MG/ML IJ SOLN
25.0000 mg | Freq: Once | INTRAMUSCULAR | Status: AC
  Administered 2017-01-11: 25 mg via INTRAVENOUS
  Filled 2017-01-11: qty 1

## 2017-01-11 MED ORDER — SODIUM CHLORIDE 0.9 % IV SOLN
1000.0000 mL | INTRAVENOUS | Status: DC
  Administered 2017-01-11: 1000 mL via INTRAVENOUS

## 2017-01-11 NOTE — ED Provider Notes (Signed)
[facility]-EMERGENCY DEPT Provider Note   CSN: [id number] Arrival date & time: 01/11/17  1308     History   Chief Complaint Chief Complaint  Patient presents with  . Headache  . Blurred Vision  . Hip Pain    HPI [name] is a 36 y.o. female.  Pt presents to the ED today with a right sided headache that has been going on for about 1 week.  Pt has had intermittent blurred vision as well.  No blurred vision currently.  No trauma.  Pt is supposed to wear glasses when looking at the computer screen, but has not in a few years.  She has not seen the eye doctor in a few years.  The pt denies any numbness or muscle weakness.  She does c/o right hip that has been chronic.  Her pcp has her on tramadol.  The pt is a diabetic and said her blood sugars have been good.      Past Medical History:  Diagnosis Date  . Anemia   . Carpal tunnel syndrome   . Depression   . Diabetes mellitus without complication (HCC)   . Diabetic neuropathy (HCC)   . Hemoglobin C trait (HCC)   . High cholesterol     There are no active problems to display for this patient.   Past Surgical History:  Procedure Laterality Date  . CESAREAN SECTION    . MOUTH SURGERY    . TUBAL LIGATION      OB History    No data available       Home Medications    Prior to Admission medications   Medication Sig Start Date End Date Taking? Authorizing Provider  cephALEXin (KEFLEX) 500 MG capsule Take 1 capsule (500 mg total) by mouth 2 (two) times daily. 10/03/16   Garlon Hatchet, PA-C  HYDROcodone-acetaminophen (NORCO/VICODIN) 5-325 MG tablet Take 1 tablet by mouth every 6 (six) hours as needed. 07/07/16   Danelle Berry, PA-C  Insulin Glargine (LANTUS SOLOSTAR) 100 UNIT/ML Solostar Pen Inject 30 Units into the skin daily. 10/02/16   Historical Provider, MD  metFORMIN (GLUCOPHAGE) 500 MG tablet Take 500 mg by mouth 2 (two) times daily with a meal.    Historical Provider, MD  pantoprazole (PROTONIX) 20 MG tablet Take 1  tablet (20 mg total) by mouth daily. Patient not taking: Reported on 10/03/2016 05/01/16   [facility] Ward, PA-C  polyethylene glycol [facility] / Ethelene Hal) packet Take 17 g by mouth daily. 10/02/16   Historical Provider, MD  predniSONE (DELTASONE) 20 MG tablet Take 2 tablets (40 mg total) by mouth daily. Take 40 mg by mouth daily for 3 days, then  by mouth daily for 3 days, then  daily for 3 days Patient not taking: Reported on 10/03/2016 07/07/16   Danelle Berry, PA-C  ranitidine (ZANTAC) 150 MG tablet Take 150 mg by mouth 2 (two) times daily. 05/08/16   Historical Provider, MD  traMADol (ULTRAM) 50 MG tablet Take 50 mg by mouth 3 (three) times daily as needed for moderate pain.    Historical Provider, MD    Family History Family History  Problem Relation Age of Onset  . Diabetes Mother   . Hypertension Mother   . Hyperlipidemia Mother   . Cancer Father     Social History Social History  Substance Use Topics  . Smoking status: Never Smoker  . Smokeless tobacco: Never Used  . Alcohol use No     Allergies   Magnesium-containing compounds and  Tramadol   Review of Systems Review of Systems  Eyes: Positive for visual disturbance.  Musculoskeletal:       Right hip pain  Neurological: Positive for headaches.  All other systems reviewed and are negative.    Physical Exam Updated Vital Signs BP 109/73 (BP Location: Right Arm)   Pulse 79   Temp 98.3 F (36.8 C) (Oral)   Resp 18   Ht  (1.6 m)   Wt 205 lb (93 kg)   LMP 01/11/2017   SpO2 98%   BMI 36.31 kg/m   Physical Exam  Constitutional: She is oriented to person, place, and time. She appears well-developed and well-nourished.  HENT:  Head: Normocephalic and atraumatic.  Right Ear: External ear normal.  Left Ear: External ear normal.  Nose: Nose normal.  Mouth/Throat: Oropharynx is clear and moist.  Eyes: Conjunctivae and EOM are normal. Pupils are equal, round, and reactive to light.  Neck: Normal range  of motion. Neck supple.  Cardiovascular: Normal rate, regular rhythm, normal heart sounds and intact distal pulses.   Pulmonary/Chest: Effort normal and breath sounds normal.  Abdominal: Soft. Bowel sounds are normal.  Musculoskeletal: Normal range of motion.       Legs: Neurological: She is alert and oriented to person, place, and time.  Skin: Skin is warm and dry.  Psychiatric: She has a normal mood and affect. Her behavior is normal. Judgment and thought content normal.  Nursing note and vitals reviewed.    ED Treatments / Results  Labs (all labs ordered are listed, but only abnormal results are displayed) Labs Reviewed  CBC - Abnormal; Notable for the following:       Result Value   HCT 34.6 (*)    All other components within normal limits  BASIC METABOLIC PANEL - Abnormal; Notable for the following:    Sodium 134 (*)    Glucose, Bld 525 (*)    Calcium 8.3 (*)    All other components within normal limits  CBG MONITORING, ED - Abnormal; Notable for the following:    Glucose-Capillary 280 (*)    All other components within normal limits  HCG, QUANTITATIVE, PREGNANCY    EKG  EKG Interpretation None       Radiology Ct Head Wo Contrast  Result Date: 01/11/2017 CLINICAL DATA:  Headache x1 week EXAM: CT HEAD WITHOUT CONTRAST TECHNIQUE: Contiguous axial images were obtained from the base of the skull through the vertex without intravenous contrast. COMPARISON:  None. FINDINGS: Brain: No evidence of acute infarction, hemorrhage, hydrocephalus, extra-axial collection or mass lesion/mass effect. Vascular: No hyperdense vessel or unexpected calcification. Skull: Normal. Negative for fracture or focal lesion. Sinuses/Orbits: The visualized paranasal sinuses are essentially clear. The mastoid air cells are unopacified. Other: None. IMPRESSION: Normal head CT. Electronically Signed   By: Charline Bills M.D.   On: 01/11/2017 11:40   Dg Hip Unilat W Or Wo Pelvis 2-3 Views  Right  Result Date: 01/11/2017 CLINICAL DATA:  Worsening right hip pain for 6 months. EXAM: DG HIP (WITH OR WITHOUT PELVIS) 2-3V RIGHT COMPARISON:  July 07, 2016 FINDINGS: There is no evidence of hip fracture or dislocation. There is no evidence of arthropathy or other focal bone abnormality. IMPRESSION: Negative. Electronically Signed   By: Gerome Sam III M.D   On: 01/11/2017 12:16    Procedures Procedures (including critical care time)  Medications Ordered in ED Medications  0.9 %  sodium chloride infusion (0 mLs Intravenous Stopped 01/11/17 1150)  Followed by  0.9 %  sodium chloride infusion (1,000 mLs Intravenous New Bag/Given 01/11/17 1109)  metoCLOPramide (REGLAN) injection 10 mg (5 mg Intravenous Given 01/11/17 1041)  diphenhydrAMINE (BENADRYL) injection 25 mg (25 mg Intravenous Given 01/11/17 1041)  ketorolac (TORADOL) 30 MG/ML injection 30 mg (30 mg Intravenous Given 01/11/17 1041)  insulin aspart (novoLOG) injection 10 Units (10 Units Intravenous Given 01/11/17 1231)     Initial Impression / Assessment and Plan / ED Course  I have reviewed the triage vital signs and the nursing notes.  Pertinent labs & imaging results that were available during my care of the patient were reviewed by me and considered in my medical decision making (see chart for details).  The nurse did the visual acuity and pt could not see the E on the chart.  The pt is able to see color and me, but it is blurry.  I spoke with Dr. Cathey Endow (ophthalmology) who said for pt to go directly to his office.   Pt's blood sugar has improved, but no improvement in her sight.     Final Clinical Impressions(s) / ED Diagnoses   Final diagnoses:  Acute nonintractable headache, unspecified headache type  Poorly controlled type 2 diabetes mellitus (HCC)  Tendonitis    New Prescriptions New Prescriptions   No medications on file     Jacalyn Lefevre, MD 01/11/17 1351

## 2017-01-11 NOTE — ED Triage Notes (Signed)
Patient reports constant right-sided headache x 1 week. Reports intermittent blurred vision since Friday. Denies blurred vision at this time. Pt  Conscious, alert, oriented, ambulatory. No other neuro deficits noted in triage. Patient also complaining of right hip for months. Patient reports that it seems to be getting worse. Denies injury/trauma to the area.

## 2017-01-11 NOTE — ED Notes (Signed)
This RN has attempted to educate and redirect Pt regarding her stay in the [facility].  Pt continues to request to get up out of bed though she is barely able to bear weight on her own.  This RN and Nurse Tech did get Pt up to bedside commode X 1.  It is not safe to do so.  Pt has been educated that she is a fall risk and that she will need to use brief and/or bedpan.  Pt is upset about this and very anxious in general.  She continues to call out very loudly for help even after staff has left her room seconds before.

## 2017-01-12 LAB — BASIC METABOLIC PANEL
ANION GAP: 8 (ref 5–15)
BUN: 8 mg/dL (ref 6–20)
CO2: 25 mmol/L (ref 22–32)
Calcium: 8.3 mg/dL — ABNORMAL LOW (ref 8.9–10.3)
Chloride: 101 mmol/L (ref 101–111)
Creatinine, Ser: 0.61 mg/dL (ref 0.44–1.00)
GFR calc Af Amer: >60 mL/min (ref >60)
GLUCOSE: 525 mg/dL — AB (ref 65–99)
POTASSIUM: 3.9 mmol/L (ref 3.5–5.1)
Sodium: 134 mmol/L — ABNORMAL LOW (ref 135–145)

## 2017-01-13 ENCOUNTER — Other Ambulatory Visit (HOSPITAL_COMMUNITY): Payer: Self-pay | Admitting: Ophthalmology

## 2017-01-13 DIAGNOSIS — H539 Unspecified visual disturbance: Secondary | ICD-10-CM

## 2017-01-15 ENCOUNTER — Ambulatory Visit (HOSPITAL_COMMUNITY)
Admission: RE | Admit: 2017-01-15 | Discharge: 2017-01-15 | Disposition: A | Discharge status: Home / Self Care | Payer: BLUE CROSS/BLUE SHIELD | Source: Ambulatory Visit | Attending: Cardiovascular Disease | Admitting: Cardiovascular Disease

## 2017-01-15 DIAGNOSIS — R51 Headache: Secondary | ICD-10-CM | POA: Insufficient documentation

## 2017-01-15 DIAGNOSIS — H539 Unspecified visual disturbance: Secondary | ICD-10-CM

## 2017-01-18 ENCOUNTER — Encounter (HOSPITAL_COMMUNITY): Payer: BLUE CROSS/BLUE SHIELD

## 2017-01-26 ENCOUNTER — Other Ambulatory Visit: Payer: Self-pay | Admitting: Ophthalmology

## 2017-01-26 ENCOUNTER — Telehealth (HOSPITAL_COMMUNITY): Payer: Self-pay | Admitting: Ophthalmology

## 2017-01-26 DIAGNOSIS — H539 Unspecified visual disturbance: Secondary | ICD-10-CM

## 2017-01-27 NOTE — Telephone Encounter (Signed)
  01/26/2017 01:39 PM Phone (Outgoing) Rancourt, Shanda BumpsJessica (Self) [phone] (H)   Left Message - Called pt and lmsg for her to CB .    By Elita BooneGriffin, Norris Bodley A

## 2017-01-29 ENCOUNTER — Inpatient Hospital Stay (INDEPENDENT_AMBULATORY_CARE_PROVIDER_SITE_OTHER): Payer: BLUE CROSS/BLUE SHIELD | Admitting: Physician Assistant

## 2017-02-05 ENCOUNTER — Other Ambulatory Visit (HOSPITAL_COMMUNITY): Payer: BLUE CROSS/BLUE SHIELD

## 2017-02-08 ENCOUNTER — Other Ambulatory Visit (HOSPITAL_COMMUNITY): Payer: BLUE CROSS/BLUE SHIELD

## 2017-02-18 ENCOUNTER — Other Ambulatory Visit: Payer: Self-pay

## 2017-02-18 ENCOUNTER — Ambulatory Visit (HOSPITAL_COMMUNITY): Payer: BLUE CROSS/BLUE SHIELD | Attending: Internal Medicine

## 2017-02-18 DIAGNOSIS — H539 Unspecified visual disturbance: Secondary | ICD-10-CM

## 2017-02-18 DIAGNOSIS — R51 Headache: Secondary | ICD-10-CM | POA: Diagnosis present

## 2017-02-18 DIAGNOSIS — I313 Pericardial effusion (noninflammatory): Secondary | ICD-10-CM | POA: Insufficient documentation

## 2017-05-31 ENCOUNTER — Encounter (HOSPITAL_COMMUNITY): Payer: Self-pay | Admitting: Emergency Medicine

## 2017-05-31 ENCOUNTER — Emergency Department (HOSPITAL_COMMUNITY)
Admission: EM | Admit: 2017-05-31 | Discharge: 2017-05-31 | Disposition: A | Discharge status: Home / Self Care | Payer: BLUE CROSS/BLUE SHIELD | Attending: Emergency Medicine | Admitting: Emergency Medicine

## 2017-05-31 ENCOUNTER — Emergency Department (HOSPITAL_COMMUNITY): Payer: BLUE CROSS/BLUE SHIELD

## 2017-05-31 DIAGNOSIS — Z794 Long term (current) use of insulin: Secondary | ICD-10-CM | POA: Insufficient documentation

## 2017-05-31 DIAGNOSIS — S301XXA Contusion of abdominal wall, initial encounter: Secondary | ICD-10-CM

## 2017-05-31 DIAGNOSIS — Z79899 Other long term (current) drug therapy: Secondary | ICD-10-CM | POA: Diagnosis not present

## 2017-05-31 DIAGNOSIS — R109 Unspecified abdominal pain: Secondary | ICD-10-CM | POA: Diagnosis present

## 2017-05-31 DIAGNOSIS — E119 Type 2 diabetes mellitus without complications: Secondary | ICD-10-CM | POA: Insufficient documentation

## 2017-05-31 LAB — COMPREHENSIVE METABOLIC PANEL
ALK PHOS: 61 U/L (ref 38–126)
ALT: 17 U/L (ref 14–54)
ANION GAP: 6 (ref 5–15)
AST: 13 U/L — ABNORMAL LOW (ref 15–41)
Albumin: 3.5 g/dL (ref 3.5–5.0)
BILIRUBIN TOTAL: 0.6 mg/dL (ref 0.3–1.2)
BUN: 7 mg/dL (ref 6–20)
CO2: 26 mmol/L (ref 22–32)
CREATININE: 0.46 mg/dL (ref 0.44–1.00)
Calcium: 9.1 mg/dL (ref 8.9–10.3)
Chloride: 108 mmol/L (ref 101–111)
Glucose, Bld: 301 mg/dL — ABNORMAL HIGH (ref 65–99)
Potassium: 3.8 mmol/L (ref 3.5–5.1)
SODIUM: 140 mmol/L (ref 135–145)
TOTAL PROTEIN: 7 g/dL (ref 6.5–8.1)

## 2017-05-31 LAB — URINALYSIS, ROUTINE W REFLEX MICROSCOPIC
BILIRUBIN URINE: NEGATIVE
Glucose, UA: >=500 mg/dL — AB
Ketones, ur: NEGATIVE mg/dL
NITRITE: POSITIVE — AB
PH: 5 (ref 5.0–8.0)
Protein, ur: 30 mg/dL — AB
SPECIFIC GRAVITY, URINE: 1.031 — AB (ref 1.005–1.030)

## 2017-05-31 LAB — LIPASE, BLOOD: Lipase: 56 U/L — ABNORMAL HIGH (ref 11–51)

## 2017-05-31 LAB — CBC
HCT: 33.4 % — ABNORMAL LOW (ref 36.0–46.0)
HEMOGLOBIN: 12.2 g/dL (ref 12.0–15.0)
MCH: 29.7 pg (ref 26.0–34.0)
MCHC: 36.5 g/dL — ABNORMAL HIGH (ref 30.0–36.0)
MCV: 81.3 fL (ref 78.0–100.0)
PLATELETS: 228 10*3/uL (ref 150–400)
RBC: 4.11 MIL/uL (ref 3.87–5.11)
RDW: 13 % (ref 11.5–15.5)
WBC: 5 10*3/uL (ref 4.0–10.5)

## 2017-05-31 MED ORDER — IOPAMIDOL (ISOVUE-300) INJECTION 61%
100.0000 mL | Freq: Once | INTRAVENOUS | Status: AC | PRN
  Administered 2017-05-31: 100 mL via INTRAVENOUS

## 2017-05-31 MED ORDER — ONDANSETRON HCL 4 MG/2ML IJ SOLN
4.0000 mg | Freq: Once | INTRAMUSCULAR | Status: AC
  Administered 2017-05-31: 4 mg via INTRAVENOUS
  Filled 2017-05-31: qty 2

## 2017-05-31 MED ORDER — IOPAMIDOL (ISOVUE-300) INJECTION 61%
INTRAVENOUS | Status: AC
  Filled 2017-05-31: qty 100

## 2017-05-31 MED ORDER — DEXTROSE 5 % IV SOLN
2.0000 g | Freq: Once | INTRAVENOUS | Status: AC
  Administered 2017-05-31: 2 g via INTRAVENOUS
  Filled 2017-05-31: qty 2

## 2017-05-31 MED ORDER — NITROFURANTOIN MONOHYD MACRO 100 MG PO CAPS: 100.0000 mg | ORAL_CAPSULE | Freq: Two times a day (BID) | ORAL | 0 refills | Status: DC

## 2017-05-31 MED ORDER — ONDANSETRON HCL 4 MG PO TABS: 4.0000 mg | ORAL_TABLET | Freq: Four times a day (QID) | ORAL | 0 refills | Status: DC

## 2017-05-31 MED ORDER — SODIUM CHLORIDE 0.9 % IV BOLUS (SEPSIS)
1000.0000 mL | Freq: Once | INTRAVENOUS | Status: AC
  Administered 2017-05-31: 1000 mL via INTRAVENOUS

## 2017-05-31 MED ORDER — MORPHINE SULFATE (PF) 2 MG/ML IV SOLN
2.0000 mg | Freq: Once | INTRAVENOUS | Status: AC
  Administered 2017-05-31: 2 mg via INTRAVENOUS
  Filled 2017-05-31: qty 1

## 2017-05-31 NOTE — ED Notes (Signed)
Pt reported tolerated PO trial well.

## 2017-05-31 NOTE — ED Notes (Signed)
PO challenge initiated

## 2017-05-31 NOTE — ED Notes (Signed)
Pt is aware of need for urine sample but is unable to provide one at this time.

## 2017-05-31 NOTE — ED Provider Notes (Signed)
[facility]PT Provider Note   CSN: [id number] Arrival date & time: 05/31/17  [id number]     History   Chief Complaint Chief Complaint  Patient presents with  . Abdominal Pain    HPI [name] is a 36 y.o. female presenting with acute onset nausea, diarrhea, and abdominal pain.  Patient states that last night, she started to have persistent nausea and diarrhea. She is unable to tolerate anything by mouth. She reports abdominal pain of the upper abdomen with radiation bilaterally. She states she was feeling fine prior to last night. She had pizza with several friends last night, no else is sick. No other sick contacts at home. She has not had any recent travel. Medical history significant for diabetes, usually around the 200s. She denies fever, but reports chills. She denies chest pain, shortness of breath, lower abdominal pain. She is currently taking Bactrim due to frequent UTIs around her period. She has been on Bactrim since Thursday (5 days).  Patient has a primary care doctor, with which she can easily follow-up  HPI  Past Medical History:  Diagnosis Date  . Anemia   . Carpal tunnel syndrome   . Depression   . Diabetes mellitus without complication (HCC)   . Diabetic neuropathy (HCC)   . Hemoglobin C trait (HCC)   . High cholesterol     There are no active problems to display for this patient.   Past Surgical History:  Procedure Laterality Date  . CESAREAN SECTION    . MOUTH SURGERY    . TUBAL LIGATION      OB History    No data available       Home Medications    Prior to Admission medications   Medication Sig Start Date End Date Taking? Authorizing Provider  Dulaglutide (TRULICITY) 1.5 MG/0.5ML SOPN Inject 1.5 mg into the skin once a week.   Yes [provider]  insulin aspart protamine- aspart (NOVOLOG MIX 70/30) (70-30) 100 UNIT/ML injection Inject 20-25 Units into the skin 2 (two) times daily with a meal. Takes 20 units in the morning and  25 at night   Yes [provider]  ranitidine (ZANTAC) 150 MG tablet Take 150 mg by mouth 2 (two) times daily. 05/08/16  Yes [provider]  sulfamethoxazole-trimethoprim (BACTRIM DS,SEPTRA DS) 800-160 MG tablet 1 po bid for 7 days 05/18/17  Yes [provider]  traMADol (ULTRAM) 50 MG tablet Take 50 mg by mouth 3 (three) times daily as needed for moderate pain.   Yes [provider]  cephALEXin (KEFLEX) 500 MG capsule Take 1 capsule (500 mg total) by mouth 2 (two) times daily. Patient not taking: Reported on 05/31/2017 10/03/16   Garlon HatchetSanders, Lisa M, PA-C  HYDROcodone-acetaminophen (NORCO/VICODIN) 5-325 MG tablet Take 1 tablet by mouth every 6 (six) hours as needed. Patient not taking: Reported on 05/31/2017 07/07/16   Danelle Berryapia, Leisa, PA-C  Insulin Glargine (LANTUS SOLOSTAR) 100 UNIT/ML Solostar Pen Inject 30 Units into the skin daily. 10/02/16   [provider]  metFORMIN (GLUCOPHAGE) 500 MG tablet Take 500 mg by mouth 2 (two) times daily with a meal.    [provider]  nitrofurantoin, macrocrystal-monohydrate, (MACROBID) 100 MG capsule Take 1 capsule (100 mg total) by mouth 2 (two) times daily. 05/31/17   Ewart Carrera, PA-C  ondansetron (ZOFRAN) 4 MG tablet Take 1 tablet (4 mg total) by mouth every 6 (six) hours. 05/31/17   Romelle Reiley, PA-C  pantoprazole (PROTONIX) 20 MG tablet Take 1  tablet (20 mg total) by mouth daily. Patient not taking: Reported on 10/03/2016 05/01/16   Ward, Chase Picket, PA-C  predniSONE (DELTASONE) 20 MG tablet Take 2 tablets (40 mg total) by mouth daily. Take 40 mg by mouth daily for 3 days, then  by mouth daily for 3 days, then  daily for 3 days Patient not taking: Reported on 10/03/2016 07/07/16   Danelle Berry, PA-C    Family History Family History  Problem Relation Age of Onset  . Diabetes Mother   . Hypertension Mother   . Hyperlipidemia Mother   . Cancer Father     Social History Social History    Substance Use Topics  . Smoking status: Never Smoker  . Smokeless tobacco: Never Used  . Alcohol use No     Allergies   Magnesium-containing compounds and Tramadol   Review of Systems Review of Systems  Constitutional: Negative for chills and fever.  HENT: Negative for congestion and sore throat.   Respiratory: Negative for cough, chest tightness and shortness of breath.   Gastrointestinal: Positive for abdominal pain, diarrhea, nausea and vomiting. Negative for abdominal distention, blood in stool and constipation.  Endocrine: Negative for polydipsia, polyphagia and polyuria.  Genitourinary: Negative for dysuria, frequency and hematuria.  Musculoskeletal: Negative for back pain and neck pain.  Skin: Negative for wound.  Allergic/Immunologic: Negative for immunocompromised state.  Neurological: Negative for dizziness, weakness and numbness.  Hematological: Does not bruise/bleed easily.  Psychiatric/Behavioral: Negative for agitation and confusion.     Physical Exam Updated Vital Signs BP 114/82   Pulse 66   Temp 98.2 F (36.8 C) (Oral)   Resp 16   Ht  (1.6 m)   Wt 87.5 kg (193 lb)   LMP 05/27/2017   SpO2 100%   BMI 34.19 kg/m   Physical Exam  Constitutional: She is oriented to person, place, and time. She appears well-developed and well-nourished. No distress.  HENT:  Head: Normocephalic and atraumatic.  Eyes: EOM are normal.  Neck: Normal range of motion.  Cardiovascular: Normal rate, regular rhythm and intact distal pulses.   Pulmonary/Chest: Effort normal and breath sounds normal. No respiratory distress. She has no wheezes.  Abdominal: Soft. Bowel sounds are normal. She exhibits mass. She exhibits no distension. There is tenderness in the right upper quadrant, epigastric area and left upper quadrant. There is no rigidity, no rebound, no guarding, no CVA tenderness, no tenderness at McBurney's point and negative Murphy's sign.  Tenderness to palpation of  upper abdomen bilaterally and centrally. Patient with mass of left lower/central abdomen. It is nontender and soft.  Musculoskeletal: Normal range of motion.  Lymphadenopathy:    She has no cervical adenopathy.  Neurological: She is alert and oriented to person, place, and time.  Skin: Skin is warm and dry.  Psychiatric: She has a normal mood and affect.  Nursing note and vitals reviewed.    ED Treatments / Results  Labs (all labs ordered are listed, but only abnormal results are displayed) Labs Reviewed  LIPASE, BLOOD - Abnormal; Notable for the following:       Result Value   Lipase 56 (*)    All other components within normal limits  COMPREHENSIVE METABOLIC PANEL - Abnormal; Notable for the following:    Glucose, Bld 301 (*)    AST 13 (*)    All other components within normal limits  CBC - Abnormal; Notable for the following:    HCT 33.4 (*)    MCHC 36.5 (*)  All other components within normal limits  URINALYSIS, ROUTINE W REFLEX MICROSCOPIC - Abnormal; Notable for the following:    APPearance CLOUDY (*)    Specific Gravity, Urine 1.031 (*)    Glucose, UA >=500 (*)    Hgb urine dipstick LARGE (*)    Protein, ur 30 (*)    Nitrite POSITIVE (*)    Leukocytes, UA LARGE (*)    Bacteria, UA MANY (*)    Squamous Epithelial / LPF 6-30 (*)    All other components within normal limits  URINE CULTURE    EKG  EKG Interpretation None       Radiology Ct Abdomen Pelvis W Contrast  Result Date: 05/31/2017 CLINICAL DATA:  Nausea and vomiting since midnight. Diarrhea. Upper abdominal pain. EXAM: CT ABDOMEN AND PELVIS WITH CONTRAST TECHNIQUE: Multidetector CT imaging of the abdomen and pelvis was performed using the standard protocol following bolus administration of intravenous contrast. CONTRAST:  ISOVUE-300 IOPAMIDOL (ISOVUE-300) INJECTION 61% COMPARISON:  10/03/2016 and 07/04/2014 CT exams FINDINGS: Lower chest: No acute abnormality. Minimal dependent atelectasis at the  left lung base. Hepatobiliary: No focal liver abnormality is seen. No gallstones, gallbladder wall thickening, or biliary dilatation. Pancreas: No apparent pancreatic mass. No pancreatic ductal dilatation or surrounding inflammatory changes. Spleen: Normal in size without focal abnormality. Adrenals/Urinary Tract: Adrenal glands are unremarkable. Kidneys are normal, without renal calculi, focal lesion, or hydronephrosis. Bladder is partially distended. No focal mass or calculus of the bladder. Stomach/Bowel: Nondistended appearance of the stomach with normal small bowel rotation. No small or large bowel dilatation or obstruction. Normal appendix. Vascular/Lymphatic: Laminar flow noted within the main and right portal veins similar in appearance to 2015. No aortic aneurysm. No lymphadenopathy. Reproductive: Uterus and bilateral adnexa are unremarkable. Physiologic sized follicles noted of the ovaries. Other: Trace physiologic fluid in the cul-de-sac. Appearing complex fluid collection in the subcutaneous fat of the lower anterior abdominal wall is again noted, currently measuring 3.2 x 2.2 cm versus 3.7 x 2.9 cm previously. Findings likely reflect a small chronic seroma. Musculoskeletal: No acute or significant osseous findings. S1-S2 disc noted. IMPRESSION: 1. No acute findings in the abdomen or pelvis. No findings to explain the patient's upper abdominal pain and diarrhea. 2. Stable small lower abdominal wall fluid collection consistent with a postop seroma. Electronically Signed   By: Tollie Eth M.D.   On: 05/31/2017 14:38    Procedures Procedures (including critical care time)  Medications Ordered in ED Medications  iopamidol (ISOVUE-300) 61 % injection (not administered)  sodium chloride 0.9 % bolus 1,000 mL (0 mLs Intravenous Stopped 05/31/17 1305)  morphine 2 MG/ML injection 2 mg (2 mg Intravenous Given 05/31/17 1155)  ondansetron (ZOFRAN) injection 4 mg (4 mg Intravenous Given 05/31/17 1155)    cefTRIAXone (ROCEPHIN) 2 g in dextrose 5 % 50 mL IVPB (0 g Intravenous Stopped 05/31/17 1411)  iopamidol (ISOVUE-300) 61 % injection 100 mL (100 mLs Intravenous Contrast Given 05/31/17 1403)     Initial Impression / Assessment and Plan / ED Course  I have reviewed the triage vital signs and the nursing notes.  Pertinent labs & imaging results that were available during my care of the patient were reviewed by me and considered in my medical decision making (see chart for details).     Patient presents with acute onset nausea, vomiting, diarrhea, abdominal pain last night. Physical exam shows tenderness to palpation of upper abdomen and nontender mass of lower abdomen. Vital signs stable and reassuring. Labs show elevated  glucose, but otherwise reassuring. No anion gap and CO2 negative. Lipase mildly elevated at 56. Will start IV fluids, morphine for pain control, Zofran for nausea, and order CT abdomen.  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 No signs of an intra-abdominal infection, obstruction, perforation, or peritonitis. UA positive for infection. Will give dose of Rocephin, as patient has UTI while on Bactrim. By mouth challenge.  Patient tolerated by mouth without difficulty. Discussed findings with patient. Will discharge patient with antibiotic for Macrobid and send urine for culture. Patient to follow-up with primary care regarding future anabolic use for UTIs. Discussed diet control of his neck several days to improve symptoms. At this time, patient appears safe for discharge. Return precautions given. Patient states she understands and agrees to plan.  Final Clinical Impressions(s) / ED Diagnoses   Final diagnoses:  Acute abdominal pain  Abdominal wall seroma, initial encounter [facility])    New Prescriptions Discharge Medication List as of 05/31/2017  3:30 PM    START taking these medications   Details  nitrofurantoin, macrocrystal-monohydrate, (MACROBID) 100 MG capsule  Take 1 capsule (100 mg total) by mouth 2 (two) times daily., Starting Mon 05/31/2017, Print    ondansetron (ZOFRAN) 4 MG tablet Take 1 tablet (4 mg total) by mouth every 6 (six) hours., Starting Mon 05/31/2017, Print         [place], [place], PA-C 05/31/17 2956    Shaune Pollack, MD 06/01/17 [phone]

## 2017-05-31 NOTE — Discharge Instructions (Signed)
Stay well hydrated with water.  Take antibiotics daily for UTI.  You may use zofran as needed for nausea or vomiting.  Eat small meals, avoiding spicy, acidic, or daily foods. Avoid high sugar drinks. Follow up with your primary care doctor about antibiotic use for UTIs and for monitoring your chronic seroma.  Return to the ER if you develop persistent high fevers, chest pain, shortness or breath, blood in your stool, or any new or worsening symptoms.

## 2017-05-31 NOTE — ED Triage Notes (Signed)
Per pt, states vomiting, diarrhea and abdominal pain since 1200 last night-doesn't know if she ate something bad or if she has a GI bug

## 2017-06-03 LAB — URINE CULTURE

## 2017-06-06 ENCOUNTER — Telehealth: Payer: Self-pay | Admitting: Emergency Medicine

## 2017-06-06 NOTE — Telephone Encounter (Signed)
Post ED Visit - Positive Culture Follow-up  Culture report reviewed by antimicrobial stewardship pharmacist:   Enzo Bi, Pharm.D.  Celedonio Miyamoto, Pharm.D., BCPS AQ-ID  Garvin Fila, Pharm.D., BCPS  Georgina Pillion, Pharm.D., BCPS  [place], [place].D., BCPS, AAHIVP  Estella Husk, Pharm.D., BCPS, AAHIVP  Lysle Pearl, PharmD, BCPS  Casilda Carls, PharmD, BCPS  Pollyann Samples, PharmD, BCPS  Positive urine culture Treated with sulfamethoxazole-trimethoprim and nitrofurantoin, organism sensitive to the same and no further patient follow-up is required at this time.  Berle Mull 06/06/2017, 7:40 AM

## 2017-07-14 ENCOUNTER — Encounter (HOSPITAL_COMMUNITY): Payer: Self-pay

## 2017-07-14 ENCOUNTER — Emergency Department (HOSPITAL_COMMUNITY)
Admission: EM | Admit: 2017-07-14 | Discharge: 2017-07-15 | Disposition: A | Discharge status: Home / Self Care | Payer: BLUE CROSS/BLUE SHIELD | Attending: Emergency Medicine | Admitting: Emergency Medicine

## 2017-07-14 ENCOUNTER — Emergency Department (HOSPITAL_COMMUNITY): Payer: BLUE CROSS/BLUE SHIELD

## 2017-07-14 DIAGNOSIS — Z79899 Other long term (current) drug therapy: Secondary | ICD-10-CM | POA: Insufficient documentation

## 2017-07-14 DIAGNOSIS — E119 Type 2 diabetes mellitus without complications: Secondary | ICD-10-CM | POA: Insufficient documentation

## 2017-07-14 DIAGNOSIS — G5603 Carpal tunnel syndrome, bilateral upper limbs: Secondary | ICD-10-CM | POA: Diagnosis not present

## 2017-07-14 DIAGNOSIS — R200 Anesthesia of skin: Secondary | ICD-10-CM | POA: Diagnosis present

## 2017-07-14 DIAGNOSIS — Z794 Long term (current) use of insulin: Secondary | ICD-10-CM | POA: Diagnosis not present

## 2017-07-14 NOTE — ED Triage Notes (Signed)
Pt complains of bilateral hand numbness for one week No other symptoms Pt can move her fingers but they tingle

## 2017-07-15 ENCOUNTER — Encounter (HOSPITAL_COMMUNITY): Payer: Self-pay | Admitting: Emergency Medicine

## 2017-07-15 MED ORDER — KETOROLAC TROMETHAMINE 60 MG/2ML IM SOLN
30.0000 mg | Freq: Once | INTRAMUSCULAR | Status: AC
  Administered 2017-07-15: 30 mg via INTRAMUSCULAR
  Filled 2017-07-15: qty 2

## 2017-07-15 MED ORDER — DICLOFENAC SODIUM ER 100 MG PO TB24: 100.0000 mg | ORAL_TABLET | Freq: Every day | ORAL | 0 refills | Status: DC

## 2017-07-15 NOTE — ED Provider Notes (Signed)
[facility]   CSN: [id number] Arrival date & time: 07/14/17  2017     History   Chief Complaint Chief Complaint  Patient presents with  . hand numbness    HPI Nicole GrillsJessica Dunlap is a 36 y.o. female.  The history is provided by the patient.  Hand Pain  This is a recurrent problem. The current episode started more than 1 week ago. The problem occurs constantly. The problem has not changed since onset.Pertinent negatives include no chest pain, no abdominal pain, no headaches and no shortness of breath. Nothing aggravates the symptoms. Nothing relieves the symptoms. Treatments tried: tramadol. The treatment provided no relief.  Has a h/o carpal tunnel and no splints has tingling of the thumb and first 2 fingers B and pain.  No swelling.    Past Medical History:  Diagnosis Date  . Anemia   . Carpal tunnel syndrome   . Depression   . Diabetes mellitus without complication (HCC)   . Diabetic neuropathy (HCC)   . Hemoglobin C trait (HCC)   . High cholesterol     There are no active problems to display for this patient.   Past Surgical History:  Procedure Laterality Date  . CESAREAN SECTION    . MOUTH SURGERY    . TUBAL LIGATION      OB History    No data available       Home Medications    Prior to Admission medications   Medication Sig Start Date End Date Taking? Authorizing Provider  insulin aspart protamine- aspart (NOVOLOG MIX 70/30) (70-30) 100 UNIT/ML injection Inject 20-25 Units into the skin 2 (two) times daily with a meal. Takes 20 units in the morning and 25 at night   Yes [provider]  ranitidine (ZANTAC) 150 MG tablet Take 150 mg by mouth 2 (two) times daily. 05/08/16  Yes [provider]  nitrofurantoin, macrocrystal-monohydrate, (MACROBID) 100 MG capsule Take 1 capsule (100 mg total) by mouth 2 (two) times daily. Patient not taking: Reported on 07/15/2017 05/31/17   Caccavale, Sophia,  PA-C  ondansetron (ZOFRAN) 4 MG tablet Take 1 tablet (4 mg total) by mouth every 6 (six) hours. Patient not taking: Reported on 07/15/2017 05/31/17   Caccavale, Sophia, PA-C  pantoprazole (PROTONIX) 20 MG tablet Take 1 tablet (20 mg total) by mouth daily. Patient not taking: Reported on 10/03/2016 05/01/16   Ward, Chase PicketJaime Pilcher, PA-C    Family History Family History  Problem Relation Age of Onset  . Diabetes Mother   . Hypertension Mother   . Hyperlipidemia Mother   . Cancer Father     Social History Social History  Substance Use Topics  . Smoking status: Never Smoker  . Smokeless tobacco: Never Used  . Alcohol use No     Allergies   Magnesium-containing compounds and Tramadol   Review of Systems Review of Systems  Constitutional: Negative for fever.  Respiratory: Negative for shortness of breath.   Cardiovascular: Negative for chest pain.  Gastrointestinal: Negative for abdominal pain.  Musculoskeletal: Positive for arthralgias. Negative for back pain, gait problem, joint swelling, myalgias, neck pain and neck stiffness.  Neurological: Negative for dizziness, seizures, facial asymmetry, speech difficulty, weakness, light-headedness, numbness and headaches.  All other systems reviewed and are negative.    Physical Exam Updated Vital Signs BP 120/84 (BP Location: Right Arm)   Pulse 93   Temp 99.3 F (37.4 C) (Oral)   Resp (!) 22   Ht 5'  3" (1.6 m)   Wt 88.8 kg (195 lb 12.8 oz)   LMP 06/21/2017   SpO2 99%   BMI 34.68 kg/m   Physical Exam  Constitutional: She is oriented to person, place, and time. She appears well-developed and well-nourished. No distress.  HENT:  Head: Normocephalic and atraumatic.  Mouth/Throat: No oropharyngeal exudate.  Eyes: Pupils are equal, round, and reactive to light. Conjunctivae are normal.  Neck: Normal range of motion. Neck supple.  Cardiovascular: Normal rate, regular rhythm, normal heart sounds and intact distal pulses.     Pulmonary/Chest: Effort normal and breath sounds normal. She has no wheezes. She has no rales.  Abdominal: Soft. Bowel sounds are normal. She exhibits no mass. There is no tenderness. There is no rebound and no guarding.  Musculoskeletal: Normal range of motion.  Lymphadenopathy:    She has no cervical adenopathy.  Neurological: She is alert and oriented to person, place, and time. She displays normal reflexes. No sensory deficit.  positivve tinel and phalen sign  Skin: Skin is warm and dry. Capillary refill takes less than 2 seconds.  Psychiatric: She has a normal mood and affect.     ED Treatments / Results  Labs (all labs ordered are listed, but only abnormal results are displayed) Labs Reviewed - No data to display  EKG  EKG Interpretation  Date/Time:  Thursday July 15 2017 00:03:49 EDT Ventricular Rate:  87 PR Interval:    QRS Duration: 85 QT Interval:  391 QTC Calculation: 471 R Axis:   67 Text Interpretation:  Sinus rhythm Confirmed by Miliani Deike ([id number]) on 07/15/2017 1:06:37 AM       Radiology Dg Chest 2 View  Result Date: 07/15/2017 CLINICAL DATA:  Acute onset of bilateral hand numbness and chest tightness. Initial encounter. EXAM: CHEST  2 VIEW COMPARISON:  Chest radiograph performed 05/01/2016 FINDINGS: The lungs are well-aerated and clear. There is no evidence of focal opacification, pleural effusion or pneumothorax. The heart is normal in size; the mediastinal contour is within normal limits. No acute osseous abnormalities are seen. IMPRESSION: No acute cardiopulmonary process seen. Electronically Signed   By: Roanna Raider M.D.   On: 07/15/2017 00:01    Procedures Procedures (including critical care time)  Medications Ordered in ED Medications  ketorolac (TORADOL) injection 30 mg (not administered)     2 wrist splints provided, ice and elevation of B hands   Final Clinical Impressions(s) / ED Diagnoses   Final diagnoses:  Bilateral carpal  tunnel syndrome  Follow up with hand surgery for ongoing care.  Strict return precautions given for  Shortness of breath, swelling or the lips or tongue, chest pain, dyspnea on exertion, new weakness or numbness changes in vision or speech,  Inability to tolerate liquids or food, changes in voice cough, altered mental status or any concerns. No signs of systemic illness or infection. The patient is nontoxic-appearing on exam and vital signs are within normal limits.    I have reviewed the triage vital signs and the nursing notes. Pertinent labs &imaging results that were available during my care of the patient were reviewed by me and considered in my medical decision making (see chart for details).  After history, exam, and medical workup I feel the patient has been appropriately medically screened and is safe for discharge home. Pertinent diagnoses were discussed with the patient. Patient was given return precautions.     Ziyan Schoon, MD 07/15/17 [phone]

## 2017-07-15 NOTE — ED Notes (Signed)
Called ortho at [facility] to bring right universal. Waiting on splint.

## 2017-07-15 NOTE — Progress Notes (Signed)
Orthopedic Tech Progress Note Patient Details:  [name] [place] Jan 30, 1981 [id number]  Patient ID: [name] Skilling, female   DOB: Jan 30, 1981, 36 y.o.   MRN: [id number] Delivered rue velcro wrist splint to rn due to [facility]led not having one for pt.  Trinna PostMartinez, Amareon Phung J 07/15/2017, 1:44 AM

## 2017-07-15 NOTE — ED Notes (Signed)
Patient given ice pack for hands

## 2017-07-19 ENCOUNTER — Encounter (HOSPITAL_COMMUNITY): Payer: Self-pay

## 2017-07-19 DIAGNOSIS — E119 Type 2 diabetes mellitus without complications: Secondary | ICD-10-CM | POA: Insufficient documentation

## 2017-07-19 DIAGNOSIS — S30861A Insect bite (nonvenomous) of abdominal wall, initial encounter: Secondary | ICD-10-CM | POA: Insufficient documentation

## 2017-07-19 DIAGNOSIS — W57XXXA Bitten or stung by nonvenomous insect and other nonvenomous arthropods, initial encounter: Secondary | ICD-10-CM | POA: Insufficient documentation

## 2017-07-19 DIAGNOSIS — Z794 Long term (current) use of insulin: Secondary | ICD-10-CM | POA: Diagnosis not present

## 2017-07-19 DIAGNOSIS — Y939 Activity, unspecified: Secondary | ICD-10-CM | POA: Insufficient documentation

## 2017-07-19 DIAGNOSIS — Y999 Unspecified external cause status: Secondary | ICD-10-CM | POA: Insufficient documentation

## 2017-07-19 DIAGNOSIS — Z79899 Other long term (current) drug therapy: Secondary | ICD-10-CM | POA: Diagnosis not present

## 2017-07-19 DIAGNOSIS — Y929 Unspecified place or not applicable: Secondary | ICD-10-CM | POA: Diagnosis not present

## 2017-07-19 NOTE — ED Triage Notes (Signed)
Pt states that she was eating dinner and felt a spider bite her on her side, shortly after that she became short of breath, no obvious bite mark and O2 sats are normal

## 2017-07-20 ENCOUNTER — Emergency Department (HOSPITAL_COMMUNITY)
Admission: EM | Admit: 2017-07-20 | Discharge: 2017-07-20 | Disposition: A | Discharge status: Home / Self Care | Payer: BLUE CROSS/BLUE SHIELD | Attending: Emergency Medicine | Admitting: Emergency Medicine

## 2017-07-20 DIAGNOSIS — W57XXXA Bitten or stung by nonvenomous insect and other nonvenomous arthropods, initial encounter: Secondary | ICD-10-CM

## 2017-07-20 NOTE — ED Provider Notes (Signed)
[facility] Provider Note   CSN: [id number] Arrival date & time: 07/19/17  2218     History   Chief Complaint Chief Complaint  Patient presents with  . Insect Bite    HPI [name] is a 36 y.o. female with a hx of anemia, carpal tunnel, IDDM, high cholesterol presents to the Emergency Department complaining of acute insect bite around 7pm.  Pt reports she felt the sharp sting and raised her shift to find a small spider.  She reports it was brown.  She states the site was red and itched for almost an hour but those symptoms have resolved. She reports that later in the evening she had an episode of shortness of breath while driving. She reports this has happened before and tonight's episode was the same. 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  She states the episode resolved quickly and has not returned.  Pt denies fever, chills, headache, neck pain, chest pain, abd pain, N/V/D, weakness, dizziness, syncope, dysuria, rash, muscle spasms.      The history is provided by the patient and medical records. No language interpreter was used.    Past Medical History:  Diagnosis Date  . Anemia   . Carpal tunnel syndrome   . Depression   . Diabetes mellitus without complication (HCC)   . Diabetic neuropathy (HCC)   . Hemoglobin C trait (HCC)   . High cholesterol     There are no active problems to display for this patient.   Past Surgical History:  Procedure Laterality Date  . CESAREAN SECTION    . MOUTH SURGERY    . TUBAL LIGATION      OB History    No data available       Home Medications    Prior to Admission medications   Medication Sig Start Date End Date Taking? Authorizing Provider  Diclofenac Sodium CR (VOLTAREN-XR) 100 MG 24 hr tablet Take 1 tablet (100 mg total) by mouth daily. 07/15/17   Palumbo, April, MD  insulin aspart protamine- aspart (NOVOLOG MIX 70/30) (70-30)  100 UNIT/ML injection Inject 20-25 Units into the skin 2 (two) times daily with a meal. Takes 20 units in the morning and 25 at night    [provider]  nitrofurantoin, macrocrystal-monohydrate, (MACROBID) 100 MG capsule Take 1 capsule (100 mg total) by mouth 2 (two) times daily. Patient not taking: Reported on 07/15/2017 05/31/17   Caccavale, Sophia, PA-C  ondansetron (ZOFRAN) 4 MG tablet Take 1 tablet (4 mg total) by mouth every 6 (six) hours. Patient not taking: Reported on 07/15/2017 05/31/17   Caccavale, Sophia, PA-C  pantoprazole (PROTONIX) 20 MG tablet Take 1 tablet (20 mg total) by mouth daily. Patient not taking: Reported on 10/03/2016 05/01/16   Ward, Chase Picket, PA-C  ranitidine (ZANTAC) 150 MG tablet Take 150 mg by mouth 2 (two) times daily. 05/08/16   [provider]    Family History Family History  Problem Relation Age of Onset  . Diabetes Mother   . Hypertension Mother   . Hyperlipidemia Mother   . Cancer Father     Social History Social History  Substance Use Topics  . Smoking status: Never Smoker  . Smokeless tobacco: Never Used  . Alcohol use No     Allergies   Magnesium-containing compounds and Tramadol   Review of Systems Review of Systems  Constitutional: Negative for appetite change, diaphoresis,  fatigue, fever and unexpected weight change.  HENT: Negative for mouth sores.   Eyes: Negative for visual disturbance.  Respiratory: Negative for cough, chest tightness, shortness of breath and wheezing.   Cardiovascular: Negative for chest pain.  Gastrointestinal: Negative for abdominal pain, constipation, diarrhea, nausea and vomiting.  Endocrine: Negative for polydipsia, polyphagia and polyuria.  Genitourinary: Negative for dysuria, frequency, hematuria and urgency.  Musculoskeletal: Negative for back pain and neck stiffness.  Skin: Negative for rash.  Allergic/Immunologic: Negative for immunocompromised state.  Neurological: Negative  for syncope, light-headedness and headaches.  Hematological: Does not bruise/bleed easily.  Psychiatric/Behavioral: Negative for sleep disturbance. The patient is not nervous/anxious.      Physical Exam Updated Vital Signs BP (!) 131/93 (BP Location: Right Arm)   Pulse 93   Temp 98.3 F (36.8 C) (Oral)   Resp 20   Ht 5\' 3"  (1.6 m)   Wt 87.5 kg (193 lb)   LMP 06/21/2017   SpO2 98%   BMI 34.19 kg/m   Physical Exam  Constitutional: She appears well-developed and well-nourished. No distress.  Awake, alert, nontoxic appearance  HENT:  Head: Normocephalic and atraumatic.  Mouth/Throat: Oropharynx is clear and moist. No oropharyngeal exudate.  Eyes: Conjunctivae are normal. No scleral icterus.  Neck: Normal range of motion. Neck supple.  Cardiovascular: Normal rate, regular rhythm and intact distal pulses.   Pulmonary/Chest: Effort normal and breath sounds normal. No respiratory distress. She has no wheezes.  Equal chest expansion  Abdominal: Soft. Bowel sounds are normal. She exhibits no mass. There is no tenderness. There is no rebound and no guarding.  Musculoskeletal: Normal range of motion. She exhibits no edema.  Neurological: She is alert.  Speech is clear and goal oriented Moves extremities without ataxia  Skin: Skin is warm and dry. She is not diaphoretic.  Area of concern is flesh colored without rash, lesion, vesicle, bulla, petechiae or purpura.  Psychiatric: She has a normal mood and affect.  Nursing note and vitals reviewed.    ED Treatments / Results   Procedures Procedures (including critical care time)  Medications Ordered in ED Medications - No data to display   Initial Impression / Assessment and Plan / ED Course  I have reviewed the triage vital signs and the nursing notes.  Pertinent labs & imaging results that were available during my care of the patient were reviewed by me and considered in my medical decision making (see chart for details).       Pt is well appearing.  No respiratory distress or abnormality noted on exam.  No tachycardia.  No lesion noted at site of bite. No evidence of allergic reaction or anaphylaxis.  No muscle spasms to suggest black widow bite.  No skin changes to suggest brown recluse bite.  Discussed skin care and reasons to return to the ED.  Pt states understanding.     Final Clinical Impressions(s) / ED Diagnoses   Final diagnoses:  Insect bite, initial encounter    New Prescriptions New Prescriptions   No medications on file     Lashante Fryberger, Boyd KerbsHannah, PA-C 07/20/17 0222    Ward, Layla MawKristen N, DO 07/20/17 [phone]

## 2017-07-20 NOTE — ED Notes (Signed)
Provider notified of elevated hr. Provider spoke w/ pt and feels comfortable with discharge, discussed symptoms in which pt should return to ED.

## 2017-07-20 NOTE — Discharge Instructions (Signed)
1. Medications: usual home medications 2. Treatment: rest, drink plenty of fluids, keep bite area clean with warm soap and water 3. Follow Up: Please followup with your primary doctor in 2-3 days for discussion of your diagnoses and further evaluation after today's visit; if you do not have a primary care doctor use the resource guide provided to find one; Please return to the ER for fever, chills, signs of infection, difficulty breathing or other concerns

## 2017-08-08 ENCOUNTER — Encounter (HOSPITAL_COMMUNITY): Payer: Self-pay

## 2017-08-08 ENCOUNTER — Emergency Department (HOSPITAL_COMMUNITY)
Admission: EM | Admit: 2017-08-08 | Discharge: 2017-08-08 | Disposition: A | Discharge status: Home / Self Care | Payer: BLUE CROSS/BLUE SHIELD | Attending: Emergency Medicine | Admitting: Emergency Medicine

## 2017-08-08 ENCOUNTER — Other Ambulatory Visit: Payer: Self-pay

## 2017-08-08 DIAGNOSIS — Z794 Long term (current) use of insulin: Secondary | ICD-10-CM | POA: Insufficient documentation

## 2017-08-08 DIAGNOSIS — Z79899 Other long term (current) drug therapy: Secondary | ICD-10-CM | POA: Insufficient documentation

## 2017-08-08 DIAGNOSIS — M79604 Pain in right leg: Secondary | ICD-10-CM | POA: Diagnosis not present

## 2017-08-08 DIAGNOSIS — E119 Type 2 diabetes mellitus without complications: Secondary | ICD-10-CM | POA: Diagnosis not present

## 2017-08-08 NOTE — Discharge Instructions (Signed)
Please follow-up with your primary care provider regarding your ED visit and your symptoms today.  Please return to the emergency room if you develop fevers, swelling, or your pain is not bearable.

## 2017-08-08 NOTE — ED Triage Notes (Signed)
She reports feeling "some bumps" on lat. Aspect of proximal right thigh x ~1 month. She further tells us that some "more bumps are beginning to form and they are 'sore'". She also states she is seeing some bruising in this area in the absence of any trauma. She is able to ambulate capably and is in no distress.

## 2017-08-08 NOTE — ED Provider Notes (Signed)
[facility] Provider Note   CSN: [id number] Arrival date & time: 08/08/17  [id number]     History   Chief Complaint Chief Complaint  Patient presents with  . Leg Swelling    HPI [name] is a 36 y.o. female who presents today for evaluation of feeling like she has bumps on her thigh for approximately 1 month.  She reports that last night she saw some bruising in the area despite lack of any trauma.  She says that today the bruises have gone away.  She does not have any pictures of the bruises.  She denies fevers or chills, N/V/D.  She reports that she takes tramadol for pain in her back and knees and that has not been providing relief of the pain from these bumps.  She denies any difficulty walking.   HPI  Past Medical History:  Diagnosis Date  . Anemia   . Carpal tunnel syndrome   . Depression   . Diabetes mellitus without complication (HCC)   . Diabetic neuropathy (HCC)   . Hemoglobin C trait (HCC)   . High cholesterol     There are no active problems to display for this patient.   Past Surgical History:  Procedure Laterality Date  . CESAREAN SECTION    . MOUTH SURGERY    . TUBAL LIGATION      OB History    No data available       Home Medications    Prior to Admission medications   Medication Sig Start Date End Date Taking? Authorizing Provider  Diclofenac Sodium CR (VOLTAREN-XR) 100 MG 24 hr tablet Take 1 tablet (100 mg total) by mouth daily. 07/15/17   Palumbo, April, MD  insulin aspart protamine- aspart (NOVOLOG MIX 70/30) (70-30) 100 UNIT/ML injection Inject 20-25 Units into the skin 2 (two) times daily with a meal. Takes 20 units in the morning and 25 at night    [provider]  nitrofurantoin, macrocrystal-monohydrate, (MACROBID) 100 MG capsule Take 1 capsule (100 mg total) by mouth 2 (two) times daily. Patient not taking: Reported on 07/15/2017 05/31/17   Caccavale, Sophia, PA-C  ondansetron (ZOFRAN) 4 MG  tablet Take 1 tablet (4 mg total) by mouth every 6 (six) hours. Patient not taking: Reported on 07/15/2017 05/31/17   Caccavale, Sophia, PA-C  pantoprazole (PROTONIX) 20 MG tablet Take 1 tablet (20 mg total) by mouth daily. Patient not taking: Reported on 10/03/2016 05/01/16   Ward, Chase PicketJaime Pilcher, PA-C  ranitidine (ZANTAC) 150 MG tablet Take 150 mg by mouth 2 (two) times daily. 05/08/16   [provider]    Family History Family History  Problem Relation Age of Onset  . Diabetes Mother   . Hypertension Mother   . Hyperlipidemia Mother   . Cancer Father     Social History Social History   Tobacco Use  . Smoking status: Never Smoker  . Smokeless tobacco: Never Used  Substance Use Topics  . Alcohol use: No  . Drug use: No     Allergies   Magnesium-containing compounds and Tramadol   Review of Systems Review of Systems  Constitutional: Negative for chills, diaphoresis and fever.  HENT: Negative for ear pain and sore throat.   Eyes: Negative for pain and visual disturbance.  Respiratory: Negative for cough and shortness of breath.   Cardiovascular: Negative for chest pain and palpitations.  Gastrointestinal: Negative for abdominal pain and vomiting.  Genitourinary: Negative for dysuria and hematuria.  Musculoskeletal: Negative for  arthralgias and back pain.       Pain along the lateral aspect of her right thigh.  Skin: Negative for color change and rash.       Bruising last night, and bumps under her skin.  Neurological: Negative for seizures and syncope.  All other systems reviewed and are negative.    Physical Exam Updated Vital Signs BP 117/87 (BP Location: Left Arm)   Pulse 99   Temp 98.2 F (36.8 C) (Oral)   Resp 18   LMP 07/17/2017 (Exact Date)   SpO2 100%   Physical Exam  Constitutional: She is oriented to person, place, and time. She appears well-developed and well-nourished. No distress.  HENT:  Head: Normocephalic and atraumatic.  Eyes:  Conjunctivae are normal. Right eye exhibits no discharge. Left eye exhibits no discharge. No scleral icterus.  Neck: Normal range of motion.  Cardiovascular: Normal rate and regular rhythm.  Pulmonary/Chest: Effort normal. No stridor. No respiratory distress.  Abdominal: She exhibits no distension.  Musculoskeletal: She exhibits no edema or deformity.  Patient has full active range of motion to right leg.  No obvious crepitus, deformities palpated.  There is tenderness to palpation along lateral leg, in course consistent with iliotibial band.  Palpation here both re-creates and exacerbates her symptoms. There is no pitting or abnormal edema.  Legs appear similar in size.  Neurological: She is alert and oriented to person, place, and time. She exhibits normal muscle tone.  Skin: Skin is warm and dry. She is not diaphoretic.  No obvious bruising to right leg or thigh.  Skin is unremarkable.  No obvious wounds, rashes. Palpation reveals multiple subcutaneous lumps, consistent with cellulite and adipose tissue.  The same lumps are felt through her legs bilaterally.  Lumps are not consistent with lymph nodes based on location.  There are no obvious lesions to her skin.  No abnormal fluctuance, induration, or masses felt.  Psychiatric: She has a normal mood and affect. Her behavior is normal.  Nursing note and vitals reviewed.    ED Treatments / Results  Labs (all labs ordered are listed, but only abnormal results are displayed) Labs Reviewed - No data to display  EKG  EKG Interpretation None       Radiology No results found.  Procedures Procedures (including critical care time)  Medications Ordered in ED Medications - No data to display   Initial Impression / Assessment and Plan / ED Course  I have reviewed the triage vital signs and the nursing notes.  Pertinent labs & imaging results that were available during my care of the patient were reviewed by me and considered in my  medical decision making (see chart for details).    [name] presents today for evaluation of right leg pain and bumps that she noticed approximately 1 month ago.  Physical exam showed that the bumps that she feels are symmetrical on both lower extremities, and are consistent with cellulitis and adipose tissue.  Her pain is reproduced with palpation along the lateral aspect of her right leg consistent with her iliotibial band.  She was given instructions on conservative care, and instructed to follow-up with her primary care provider.  She was offered an x-ray, however declined.  She is afebrile, nontoxic-appearing.  I do not suspect infection at this time.  Wells DVT negative.   She was given return precautions and states her understanding.    Final Clinical Impressions(s) / ED Diagnoses   Final diagnoses:  Right leg pain  ED Discharge Orders    None       Cristina Gong, [place] 08/08/17 [id number]    Vanetta Mulders, MD 08/09/17 [phone]

## 2018-05-06 ENCOUNTER — Emergency Department (HOSPITAL_COMMUNITY)
Admission: EM | Admit: 2018-05-06 | Discharge: 2018-05-07 | Disposition: A | Discharge status: Home / Self Care | Payer: Self-pay | Attending: Emergency Medicine | Admitting: Emergency Medicine

## 2018-05-06 ENCOUNTER — Emergency Department (HOSPITAL_COMMUNITY): Payer: Self-pay

## 2018-05-06 ENCOUNTER — Other Ambulatory Visit: Payer: Self-pay

## 2018-05-06 DIAGNOSIS — R0789 Other chest pain: Secondary | ICD-10-CM | POA: Insufficient documentation

## 2018-05-06 DIAGNOSIS — Z79899 Other long term (current) drug therapy: Secondary | ICD-10-CM | POA: Insufficient documentation

## 2018-05-06 DIAGNOSIS — Z794 Long term (current) use of insulin: Secondary | ICD-10-CM | POA: Insufficient documentation

## 2018-05-06 DIAGNOSIS — E1140 Type 2 diabetes mellitus with diabetic neuropathy, unspecified: Secondary | ICD-10-CM | POA: Insufficient documentation

## 2018-05-06 LAB — CBC
HEMATOCRIT: 34.7 % — AB (ref 36.0–46.0)
Hemoglobin: 12.6 g/dL (ref 12.0–15.0)
MCH: 28.8 pg (ref 26.0–34.0)
MCHC: 36.3 g/dL — AB (ref 30.0–36.0)
MCV: 79.4 fL (ref 78.0–100.0)
Platelets: 241 10*3/uL (ref 150–400)
RBC: 4.37 MIL/uL (ref 3.87–5.11)
RDW: 12.7 % (ref 11.5–15.5)
WBC: 4.4 10*3/uL (ref 4.0–10.5)

## 2018-05-06 LAB — BASIC METABOLIC PANEL
Anion gap: 10 (ref 5–15)
BUN: 14 mg/dL (ref 6–20)
CO2: 24 mmol/L (ref 22–32)
Calcium: 8.9 mg/dL (ref 8.9–10.3)
Chloride: 100 mmol/L (ref 98–111)
Creatinine, Ser: 0.61 mg/dL (ref 0.44–1.00)
GFR calc Af Amer: >60 mL/min (ref >60)
GFR calc non Af Amer: >60 mL/min (ref >60)
GLUCOSE: 534 mg/dL — AB (ref 70–99)
POTASSIUM: 4.1 mmol/L (ref 3.5–5.1)
Sodium: 134 mmol/L — ABNORMAL LOW (ref 135–145)

## 2018-05-06 LAB — POCT I-STAT TROPONIN I
Troponin i, poc: 0 ng/mL (ref 0.00–0.08)
Troponin i, poc: 0.01 ng/mL (ref 0.00–0.08)

## 2018-05-06 LAB — I-STAT BETA HCG BLOOD, ED (NOT ORDERABLE): I-stat hCG, quantitative: <5 m[IU]/mL (ref <5)

## 2018-05-06 LAB — GLUCOSE, CAPILLARY: GLUCOSE-CAPILLARY: 364 mg/dL — AB (ref 70–99)

## 2018-05-06 MED ORDER — KETOROLAC TROMETHAMINE 15 MG/ML IJ SOLN
15.0000 mg | Freq: Once | INTRAMUSCULAR | Status: AC
  Administered 2018-05-06: 15 mg via INTRAVENOUS
  Filled 2018-05-06: qty 1

## 2018-05-06 MED ORDER — SODIUM CHLORIDE 0.9 % IV BOLUS
1000.0000 mL | Freq: Once | INTRAVENOUS | Status: AC
  Administered 2018-05-06: 1000 mL via INTRAVENOUS

## 2018-05-06 MED ORDER — LIDOCAINE 5 % EX PTCH: 1.0000 | MEDICATED_PATCH | CUTANEOUS | 0 refills | Status: AC

## 2018-05-06 NOTE — ED Provider Notes (Signed)
[facility]-EMERGENCY DEPT Provider Note   CSN: [id number] Arrival date & time: 05/06/18  1827     History   Chief Complaint Chief Complaint  Patient presents with  . Shortness of Breath  . Chest Pain    HPI [name] is a 37 y.o. female.  The history is provided by the patient.  Shortness of Breath  This is a new problem. The problem occurs intermittently.The problem has not changed since onset.Associated symptoms include chest pain. Pertinent negatives include no fever, no headaches, no coryza, no rhinorrhea, no sore throat, no swollen glands, no ear pain, no neck pain, no cough, no sputum production, no hemoptysis, no wheezing, no orthopnea, no syncope, no vomiting, no abdominal pain, no rash, no leg pain, no leg swelling and no claudication. The problem's precipitants include occupational exposure. She has tried nothing for the symptoms. The treatment provided no relief. She has had no prior hospitalizations. Associated medical issues do not include asthma, COPD, pneumonia, chronic lung disease, PE, CAD or past MI.  Chest Pain   Associated symptoms include shortness of breath. Pertinent negatives include no abdominal pain, no back pain, no claudication, no cough, no fever, no headaches, no hemoptysis, no leg pain, no orthopnea, no palpitations, no sputum production, no syncope and no vomiting.  Pertinent negatives for past medical history include no CAD, no MI, no PE and no seizures.    Past Medical History:  Diagnosis Date  . Anemia   . Carpal tunnel syndrome   . Depression   . Diabetes mellitus without complication (HCC)   . Diabetic neuropathy (HCC)   . Hemoglobin C trait (HCC)   . High cholesterol     There are no active problems to display for this patient.   Past Surgical History:  Procedure Laterality Date  . CESAREAN SECTION    . MOUTH SURGERY    . TUBAL LIGATION       OB History   None      Home Medications    Prior to  Admission medications   Medication Sig Start Date End Date Taking? Authorizing Provider  insulin aspart protamine- aspart (NOVOLOG MIX 70/30) (70-30) 100 UNIT/ML injection Inject 20-25 Units into the skin See admin instructions. Sliding scale   Yes [provider]  traMADol (ULTRAM) 50 MG tablet Take 50 mg by mouth every 8 (eight) hours as needed for pain. 04/05/18  Yes [provider]  lidocaine (LIDODERM) 5 % Place 1 patch onto the skin daily for 30 doses. Remove & Discard patch within 12 hours or as directed by MD 05/06/18 06/05/18  Virgina Norfolk, DO    Family History Family History  Problem Relation Age of Onset  . Diabetes Mother   . Hypertension Mother   . Hyperlipidemia Mother   . Cancer Father     Social History Social History   Tobacco Use  . Smoking status: Never Smoker  . Smokeless tobacco: Never Used  Substance Use Topics  . Alcohol use: No  . Drug use: No     Allergies   Magnesium-containing compounds and Tramadol   Review of Systems Review of Systems  Constitutional: Negative for chills and fever.  HENT: Negative for ear pain, rhinorrhea and sore throat.   Eyes: Negative for pain and visual disturbance.  Respiratory: Positive for shortness of breath. Negative for cough, hemoptysis, sputum production and wheezing.   Cardiovascular: Positive for chest pain. Negative for palpitations, orthopnea, claudication, leg swelling and syncope.  Gastrointestinal: Negative  for abdominal pain and vomiting.  Genitourinary: Negative for dysuria and hematuria.  Musculoskeletal: Positive for myalgias. Negative for arthralgias, back pain and neck pain.  Skin: Negative for color change and rash.  Neurological: Negative for seizures, syncope and headaches.  All other systems reviewed and are negative.    Physical Exam Updated Vital Signs  ED Triage Vitals  Enc Vitals Group     BP 05/06/18 1831 117/73     Pulse Rate 05/06/18 1831 (!) 104     Resp 05/06/18  1831 18     Temp 05/06/18 1831 98.3 F (36.8 C)     Temp Source 05/06/18 1831 Oral     SpO2 05/06/18 1831 100 %     Weight 05/06/18 1850 175 lb (79.4 kg)     Height 05/06/18 1850 5\' 3"  (1.6 m)     Head Circumference --      Peak Flow --      Pain Score --      Pain Loc --      Pain Edu? --      Excl. in GC? --     Physical Exam  Constitutional: She is oriented to person, place, and time. She appears well-developed and well-nourished. No distress.  HENT:  Head: Normocephalic and atraumatic.  Mouth/Throat: Oropharynx is clear and moist.  Eyes: Pupils are equal, round, and reactive to light. Conjunctivae and EOM are normal.  Neck: Normal range of motion. Neck supple.  Cardiovascular: Normal rate, regular rhythm, normal heart sounds and intact distal pulses.  No murmur heard. Pulmonary/Chest: Effort normal and breath sounds normal. No respiratory distress. She has no decreased breath sounds. She has no wheezes. She has no rales.  Abdominal: Soft. There is no tenderness.  Musculoskeletal: She exhibits no edema.       Right lower leg: She exhibits no edema.       Left lower leg: She exhibits no edema.  TTP to chest wall  Neurological: She is alert and oriented to person, place, and time.  Skin: Skin is warm and dry. Capillary refill takes less than 2 seconds.  Psychiatric: She has a normal mood and affect.  Nursing note and vitals reviewed.    ED Treatments / Results  Labs (all labs ordered are listed, but only abnormal results are displayed) Labs Reviewed  BASIC METABOLIC PANEL - Abnormal; Notable for the following components:      Result Value   Sodium 134 (*)    Glucose, Bld 534 (*)    All other components within normal limits  CBC - Abnormal; Notable for the following components:   HCT 34.7 (*)    MCHC 36.3 (*)    All other components within normal limits  GLUCOSE, CAPILLARY - Abnormal; Notable for the following components:   Glucose-Capillary 364 (*)    All other  components within normal limits  I-STAT TROPONIN, ED  I-STAT BETA HCG BLOOD, ED ([facility], [facility], [facility] ONLY)  I-STAT BETA HCG BLOOD, ED (NOT ORDERABLE)  POCT I-STAT TROPONIN I  I-STAT TROPONIN, ED  POCT I-STAT TROPONIN I    EKG EKG Interpretation  Date/Time:  Friday May 06 2018 18:32:18 EDT Ventricular Rate:  102 PR Interval:    QRS Duration: 85 QT Interval:  364 QTC Calculation: 475 R Axis:   79 Text Interpretation:  Sinus tachycardia Low voltage, precordial leads Baseline wander in lead(s) II III aVF V1 Confirmed by Virgina NorfolkAdam, Aidenn Skellenger [phone]([id number]) on 05/06/2018 8:53:50 PM   Radiology Dg Chest 2 View  Result Date: 05/06/2018 CLINICAL DATA:  Shortness of breath.  Midline chest pain EXAM: CHEST - 2 VIEW COMPARISON:  Chest radiograph 07/14/2017 FINDINGS: Stable cardiac and mediastinal contours. No consolidative pulmonary opacities. No pleural effusion or pneumothorax. Regional skeleton is unremarkable. IMPRESSION: No acute cardiopulmonary process. Electronically Signed   By: Annia Beltrew  Davis M.D.   On: 05/06/2018 19:24    Procedures Procedures (including critical care time)  Medications Ordered in ED Medications  sodium chloride 0.9 % bolus 1,000 mL (1,000 mLs Intravenous New Bag/Given 05/06/18 2123)  ketorolac (TORADOL) 15 MG/ML injection 15 mg (15 mg Intravenous Given 05/06/18 2245)     Initial Impression / Assessment and Plan / ED Course  I have reviewed the triage vital signs and the nursing notes.  Pertinent labs & imaging results that were available during my care of the patient were reviewed by me and considered in my medical decision making (see chart for details).     Nicole GrillsJessica Dunlap is a 37 year old female with history of diabetes who presents to the ED with chest pain.  Patient with unremarkable vitals.  No fever.  Patient with intermittent chest pain over the last several days.  Patient with pain that is worse with breathing and movement.  Patient works a Naval architectmechanical job lifting heavy  boxes.  Chest pain is reproducible.  Suspect patient likely with musculoskeletal pain however given history of diabetes, high cholesterol serial troponins to be ordered to eval for ACS.  EKG showed sinus tachycardia with no signs of ischemic changes.  Chest x-ray showed no acute process including no pneumonia, no pneumothorax, no pleural effusion.  Patient given IV fluids and IV Toradol.  Patient with serial troponins within normal limits.  Doubt ACS.  HEAR score is low and no further need for ACS work-up.  Patient with improvement of glucose following IV fluids.  Otherwise no significant electrolyte abnormality, acute kidney injury, anemia.  Patient has no DVT or PE risk factors and doubt PE.  Patient felt better after Toradol.  Suspect patient likely with muscleskeletal chest pain.  Given prescription for lidocaine patches.  Recommend continued use of Tylenol/Motrin as needed.  Recommend close follow-up with primary care provider for better glucose control.  Told to return to the ED if symptoms worsen.  Final Clinical Impressions(s) / ED Diagnoses   Final diagnoses:  Chest wall pain    ED Discharge Orders         Ordered    lidocaine (LIDODERM) 5 %  Every 24 hours     05/06/18 2308           Virgina NorfolkCuratolo, Carrin Vannostrand, DO 05/06/18 2329

## 2018-05-06 NOTE — ED Notes (Addendum)
Blood draw attempted, unsucessful 

## 2018-05-06 NOTE — ED Triage Notes (Addendum)
Pt to ED with c/o of SOB and chest pain for a week. Pt states chest pain has been constant and the SOB is with exertion. Pt states she also has felt dizzy. Pt states diabetes but has recently lost weight so her medications have been reduced. Pt is slightly tachycardic and PVC's noted on monitor. Pt is A&O x4.

## 2018-05-07 NOTE — ED Notes (Signed)
Discharge instructions reviewed with pt. Pt verbalized understanding. Pt to follow up with PCP. PT given prescription. PIV removed. Pt ambulatory to waiting room.

## 2018-06-18 ENCOUNTER — Other Ambulatory Visit: Payer: Self-pay

## 2018-06-18 ENCOUNTER — Encounter (HOSPITAL_COMMUNITY): Payer: Self-pay

## 2018-06-18 ENCOUNTER — Emergency Department (HOSPITAL_COMMUNITY)
Admission: EM | Admit: 2018-06-18 | Discharge: 2018-06-18 | Disposition: A | Discharge status: Home / Self Care | Payer: No Typology Code available for payment source | Attending: Emergency Medicine | Admitting: Emergency Medicine

## 2018-06-18 DIAGNOSIS — M545 Low back pain: Secondary | ICD-10-CM | POA: Diagnosis present

## 2018-06-18 DIAGNOSIS — Z794 Long term (current) use of insulin: Secondary | ICD-10-CM | POA: Diagnosis not present

## 2018-06-18 DIAGNOSIS — F329 Major depressive disorder, single episode, unspecified: Secondary | ICD-10-CM | POA: Diagnosis not present

## 2018-06-18 DIAGNOSIS — E119 Type 2 diabetes mellitus without complications: Secondary | ICD-10-CM | POA: Diagnosis not present

## 2018-06-18 DIAGNOSIS — M62830 Muscle spasm of back: Secondary | ICD-10-CM | POA: Diagnosis not present

## 2018-06-18 MED ORDER — NAPROXEN 375 MG PO TABS: 375.0000 mg | ORAL_TABLET | Freq: Two times a day (BID) | ORAL | 0 refills | Status: DC

## 2018-06-18 MED ORDER — CYCLOBENZAPRINE HCL 10 MG PO TABS: 10.0000 mg | ORAL_TABLET | Freq: Two times a day (BID) | ORAL | 0 refills | Status: DC | PRN
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
## 2018-06-18 NOTE — Discharge Instructions (Addendum)
If the muscle relaxer makes you to sleepy you can take 1/2 tablet. Do not take while driving or working.

## 2018-07-05 ENCOUNTER — Emergency Department (HOSPITAL_COMMUNITY): Payer: Self-pay

## 2018-07-05 ENCOUNTER — Encounter (HOSPITAL_COMMUNITY): Payer: Self-pay | Admitting: Emergency Medicine

## 2018-07-05 ENCOUNTER — Emergency Department (HOSPITAL_COMMUNITY)
Admission: EM | Admit: 2018-07-05 | Discharge: 2018-07-05 | Disposition: A | Discharge status: Home / Self Care | Payer: Self-pay | Attending: Emergency Medicine | Admitting: Emergency Medicine

## 2018-07-05 DIAGNOSIS — Z79899 Other long term (current) drug therapy: Secondary | ICD-10-CM | POA: Insufficient documentation

## 2018-07-05 DIAGNOSIS — M546 Pain in thoracic spine: Secondary | ICD-10-CM | POA: Insufficient documentation

## 2018-07-05 DIAGNOSIS — Z794 Long term (current) use of insulin: Secondary | ICD-10-CM | POA: Insufficient documentation

## 2018-07-05 DIAGNOSIS — E119 Type 2 diabetes mellitus without complications: Secondary | ICD-10-CM | POA: Insufficient documentation

## 2018-07-05 MED ORDER — OXYCODONE-ACETAMINOPHEN 5-325 MG PO TABS
1.0000 | ORAL_TABLET | Freq: Once | ORAL | Status: AC
  Administered 2018-07-05: 1 via ORAL
  Filled 2018-07-05: qty 1

## 2018-07-05 MED ORDER — METHOCARBAMOL 500 MG PO TABS
1000.0000 mg | ORAL_TABLET | Freq: Once | ORAL | Status: AC
  Administered 2018-07-05: 1000 mg via ORAL
  Filled 2018-07-05: qty 2

## 2018-07-05 MED ORDER — KETOROLAC TROMETHAMINE 30 MG/ML IJ SOLN
30.0000 mg | Freq: Once | INTRAMUSCULAR | Status: AC
  Administered 2018-07-05: 30 mg via INTRAMUSCULAR
  Filled 2018-07-05: qty 1

## 2018-07-05 MED ORDER — LIDOCAINE 5 % EX PTCH
1.0000 | MEDICATED_PATCH | CUTANEOUS | Status: DC
  Administered 2018-07-05: 1 via TRANSDERMAL
  Filled 2018-07-05: qty 1

## 2018-07-05 MED ORDER — METHOCARBAMOL 500 MG PO TABS: 500.0000 mg | ORAL_TABLET | Freq: Two times a day (BID) | ORAL | 0 refills | Status: DC
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
## 2018-07-05 NOTE — Discharge Instructions (Signed)
Your x-rays today show slight disc narrowing at T8-T9, no other acute findings.  I would like free to continue to use naproxen as well as the muscle relaxer prescribed to you today, please discontinue Flexeril.  You can also use over-the-counter salon pas to cane patches, ice and heat.  Please follow-up with your primary care doctor if symptoms are not improving.  Return to the emergency department for significantly worsened pain, numbness or tingling in your arms or legs, loss of bowel or bladder control or any other new or concerning symptoms.

## 2018-11-22 ENCOUNTER — Emergency Department (HOSPITAL_COMMUNITY)
Admission: EM | Admit: 2018-11-22 | Discharge: 2018-11-23 | Disposition: A | Discharge status: Home / Self Care | Payer: BLUE CROSS/BLUE SHIELD | Attending: Emergency Medicine | Admitting: Emergency Medicine

## 2018-11-22 ENCOUNTER — Emergency Department (HOSPITAL_COMMUNITY): Payer: BLUE CROSS/BLUE SHIELD

## 2018-11-22 ENCOUNTER — Other Ambulatory Visit: Payer: Self-pay

## 2018-11-22 ENCOUNTER — Encounter (HOSPITAL_COMMUNITY): Payer: Self-pay | Admitting: Emergency Medicine

## 2018-11-22 DIAGNOSIS — H5702 Anisocoria: Secondary | ICD-10-CM

## 2018-11-22 DIAGNOSIS — Z79899 Other long term (current) drug therapy: Secondary | ICD-10-CM | POA: Insufficient documentation

## 2018-11-22 DIAGNOSIS — Z794 Long term (current) use of insulin: Secondary | ICD-10-CM | POA: Insufficient documentation

## 2018-11-22 DIAGNOSIS — E119 Type 2 diabetes mellitus without complications: Secondary | ICD-10-CM | POA: Diagnosis not present

## 2018-11-22 DIAGNOSIS — R519 Headache, unspecified: Secondary | ICD-10-CM

## 2018-11-22 DIAGNOSIS — R51 Headache: Secondary | ICD-10-CM | POA: Insufficient documentation

## 2018-11-22 LAB — CBC WITH DIFFERENTIAL/PLATELET
Abs Immature Granulocytes: 0.01 10*3/uL (ref 0.00–0.07)
BASOS ABS: 0 10*3/uL (ref 0.0–0.1)
BASOS PCT: 0 %
EOS ABS: 0.2 10*3/uL (ref 0.0–0.5)
EOS PCT: 4 %
HEMATOCRIT: 33.5 % — AB (ref 36.0–46.0)
Hemoglobin: 11.6 g/dL — ABNORMAL LOW (ref 12.0–15.0)
IMMATURE GRANULOCYTES: 0 %
Lymphocytes Relative: 41 %
Lymphs Abs: 1.9 10*3/uL (ref 0.7–4.0)
MCH: 27.5 pg (ref 26.0–34.0)
MCHC: 34.6 g/dL (ref 30.0–36.0)
MCV: 79.4 fL — ABNORMAL LOW (ref 80.0–100.0)
Monocytes Absolute: 0.3 10*3/uL (ref 0.1–1.0)
Monocytes Relative: 7 %
NEUTROS PCT: 48 %
NRBC: 0 % (ref 0.0–0.2)
Neutro Abs: 2.1 10*3/uL (ref 1.7–7.7)
PLATELETS: 317 10*3/uL (ref 150–400)
RBC: 4.22 MIL/uL (ref 3.87–5.11)
RDW: 12.9 % (ref 11.5–15.5)
WBC: 4.6 10*3/uL (ref 4.0–10.5)

## 2018-11-22 LAB — COMPREHENSIVE METABOLIC PANEL
ALBUMIN: 2.9 g/dL — AB (ref 3.5–5.0)
ALT: 11 U/L (ref 0–44)
ANION GAP: 6 (ref 5–15)
AST: 11 U/L — AB (ref 15–41)
Alkaline Phosphatase: 68 U/L (ref 38–126)
BILIRUBIN TOTAL: 0.5 mg/dL (ref 0.3–1.2)
BUN: 9 mg/dL (ref 6–20)
CHLORIDE: 104 mmol/L (ref 98–111)
CO2: 26 mmol/L (ref 22–32)
Calcium: 8.3 mg/dL — ABNORMAL LOW (ref 8.9–10.3)
Creatinine, Ser: 0.49 mg/dL (ref 0.44–1.00)
GFR calc Af Amer: >60 mL/min (ref >60)
GLUCOSE: 350 mg/dL — AB (ref 70–99)
POTASSIUM: 3.4 mmol/L — AB (ref 3.5–5.1)
Sodium: 136 mmol/L (ref 135–145)
TOTAL PROTEIN: 6.4 g/dL — AB (ref 6.5–8.1)

## 2018-11-22 LAB — POC URINE PREG, ED: PREG TEST UR: NEGATIVE

## 2018-11-22 LAB — CBG MONITORING, ED: GLUCOSE-CAPILLARY: 356 mg/dL — AB (ref 70–99)

## 2018-11-22 MED ORDER — POTASSIUM CHLORIDE CRYS ER 20 MEQ PO TBCR
40.0000 meq | EXTENDED_RELEASE_TABLET | Freq: Once | ORAL | Status: AC
  Administered 2018-11-23: 40 meq via ORAL
  Filled 2018-11-22: qty 2

## 2018-11-22 MED ORDER — IOHEXOL 350 MG/ML SOLN
100.0000 mL | Freq: Once | INTRAVENOUS | Status: AC | PRN
  Administered 2018-11-22: 100 mL via INTRAVENOUS

## 2018-11-22 MED ORDER — TETRACAINE HCL 0.5 % OP SOLN
2.0000 [drp] | Freq: Once | OPHTHALMIC | Status: AC
  Administered 2018-11-22: 2 [drp] via OPHTHALMIC
  Filled 2018-11-22: qty 4

## 2018-11-22 MED ORDER — SODIUM CHLORIDE 0.9 % IV BOLUS
1000.0000 mL | Freq: Once | INTRAVENOUS | Status: AC
  Administered 2018-11-22: 1000 mL via INTRAVENOUS
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
## 2018-11-22 NOTE — ED Provider Notes (Signed)
[facility]-EMERGENCY DEPT Provider Note   CSN: [id number] Arrival date & time: 11/22/18  1716    History   Chief Complaint Chief Complaint  Patient presents with  . Headache    HPI [name] is a 38 y.o. female.     HPI   Presents with headaches Began 2 weeks ago, would come and go, after a while began to worry Sharp pain, stabbing right parietal area, comes and goes, not associated with anything.  Bright lights worsen it.  Hx of migraines, but migraines would involve headaches of whole head while this is just one area of brain, "feels like being hit".  Happening 3-4 times per day.  Not associated with any particular activities or time of day.  When headache starts, will have pain to the right side of the face and blurred vision to right eye.  No numbness or weakness, no trouble talking or walking.  No fevers. Also reports tenderness over right side of head when combing hair. No rash or burning pain. No nausea or vomiting.  Feels different from migraines, normally will have nausea with them.   Glucose have been improved.     Sore throat started yesterday. Cough productive of phlegm, worse over the last day, blood in phlegm.  No fevers.  No congestion.    No known family hx of aneurysm   Past Medical History:  Diagnosis Date  . Anemia   . Carpal tunnel syndrome   . Depression   . Diabetes mellitus without complication (HCC)   . Diabetic neuropathy (HCC)   . Hemoglobin C trait (HCC)   . High cholesterol     There are no active problems to display for this patient.   Past Surgical History:  Procedure Laterality Date  . CESAREAN SECTION    . MOUTH SURGERY    . TUBAL LIGATION       OB History   No obstetric history on file.      Home Medications    Prior to Admission medications   Medication Sig Start Date End Date Taking? Authorizing Provider  lipase/protease/amylase (CREON) 36000 UNITS CPEP capsule Take 36,000-72,000 Units by  mouth See admin instructions. Take 01027 units by mouth before and during each meals. Take 25366 units before and during each snack. Total of 3 meals and 2 snacks daily   Yes [provider]  NOVOLOG MIX 70/30 FLEXPEN (70-30) 100 UNIT/ML FlexPen Inject 15-25 Units into the skin See admin instructions. Inject 25 units in the morning and 15 units at night 06/22/18  Yes [provider]  traMADol (ULTRAM) 50 MG tablet Take 50 mg by mouth every 8 (eight) hours as needed for pain. 04/05/18  Yes [provider]  TRULICITY 0.75 MG/0.5ML SOPN Inject 0.75 mg into the skin every Tuesday. 06/22/18  Yes [provider]    Family History Family History  Problem Relation Age of Onset  . Diabetes Mother   . Hypertension Mother   . Hyperlipidemia Mother   . Cancer Father     Social History Social History   Tobacco Use  . Smoking status: Never Smoker  . Smokeless tobacco: Never Used  Substance Use Topics  . Alcohol use: No  . Drug use: No     Allergies   Shellfish allergy; Magnesium-containing compounds; and Tramadol   Review of Systems Review of Systems  Constitutional: Negative for fever.  HENT: Positive for sore throat. Negative for congestion.   Eyes: Positive for visual disturbance.  Respiratory: Positive for cough. Negative for shortness of breath (reports typical).   Cardiovascular: Negative for chest pain.  Gastrointestinal: Negative for nausea and vomiting.  Genitourinary: Negative for dysuria.  Musculoskeletal: Negative for neck pain.  Skin: Negative for rash.  Neurological: Positive for headaches. Negative for seizures, syncope, facial asymmetry, weakness and numbness.     Physical Exam Updated Vital Signs BP (!) 177/93 (BP Location: Right Arm)   Pulse 82   Temp 98.3 F (36.8 C) (Oral)   Resp 18   SpO2 98%   Physical Exam Vitals signs and nursing note reviewed.  Constitutional:      General: She is not in acute distress.     Appearance: She is well-developed. She is not diaphoretic.  HENT:     Head: Normocephalic and atraumatic.  Eyes:     General: No visual field deficit.    Conjunctiva/sclera: Conjunctivae normal.     Comments: Right pupil slight oblong shape, not reactive normally, does react to light but to lesser degree than left, and when shining light in right, lesser constriction of left eye  Normal EOM  Neck:     Musculoskeletal: Normal range of motion.  Cardiovascular:     Rate and Rhythm: Normal rate and regular rhythm.     Heart sounds: Normal heart sounds. No murmur. No friction rub. No gallop.   Pulmonary:     Effort: Pulmonary effort is normal. No respiratory distress.     Breath sounds: Normal breath sounds. No wheezing or rales.  Abdominal:     General: There is no distension.     Palpations: Abdomen is soft.     Tenderness: There is no abdominal tenderness. There is no guarding.  Musculoskeletal:        General: No tenderness.  Skin:    General: Skin is warm and dry.     Findings: No erythema or rash.  Neurological:     Mental Status: She is alert and oriented to person, place, and time.     GCS: GCS eye subscore is 4. GCS verbal subscore is 5. GCS motor subscore is 6.     Cranial Nerves: No cranial nerve deficit, dysarthria or facial asymmetry.     Sensory: No sensory deficit.     Motor: No weakness.     Coordination: Coordination normal.      ED Treatments / Results  Labs (all labs ordered are listed, but only abnormal results are displayed) Labs Reviewed  CBC WITH DIFFERENTIAL/PLATELET - Abnormal; Notable for the following components:      Result Value   Hemoglobin 11.6 (*)    HCT 33.5 (*)    MCV 79.4 (*)    All other components within normal limits  COMPREHENSIVE METABOLIC PANEL - Abnormal; Notable for the following components:   Potassium 3.4 (*)    Glucose, Bld 350 (*)    Calcium 8.3 (*)    Total Protein 6.4 (*)    Albumin 2.9 (*)    AST 11 (*)    All other  components within normal limits  CBG MONITORING, ED - Abnormal; Notable for the following components:   Glucose-Capillary 356 (*)    All other components within normal limits  CBG MONITORING, ED - Abnormal; Notable for the following components:   Glucose-Capillary 301 (*)    All other components within normal limits  POC URINE PREG, ED    EKG None  Radiology Ct Angio Head W Or Wo Contrast  Result Date: 11/22/2018 CLINICAL DATA:  Headache for 2 weeks with intermittent vision loss. EXAM: CT ANGIOGRAPHY HEAD AND NECK TECHNIQUE: Multidetector CT imaging of the head and neck was performed using the standard protocol during bolus administration of intravenous contrast. Multiplanar CT image reconstructions and MIPs were obtained to evaluate the vascular anatomy. Carotid stenosis measurements (when applicable) are obtained utilizing NASCET criteria, using the distal internal carotid diameter as the denominator. CONTRAST:  OMNIPAQUE IOHEXOL 350 MG/ML SOLN COMPARISON:  Head CT 11/22/2018, 01/11/2017 FINDINGS: CTA NECK FINDINGS SKELETON: There is no bony spinal canal stenosis. No lytic or blastic lesion. OTHER NECK: Normal pharynx, larynx and major salivary glands. No cervical lymphadenopathy. Unremarkable thyroid gland. UPPER CHEST: No pneumothorax or pleural effusion. No nodules or masses. AORTIC ARCH: There is no calcific atherosclerosis of the aortic arch. There is no aneurysm, dissection or hemodynamically significant stenosis of the visualized ascending aorta and aortic arch. Conventional 3 vessel aortic branching pattern. The visualized proximal subclavian arteries are widely patent. RIGHT CAROTID SYSTEM: --Common carotid artery: Widely patent origin without common carotid artery dissection or aneurysm. --Internal carotid artery: Normal without aneurysm, dissection or stenosis. --External carotid artery: No acute abnormality. LEFT CAROTID SYSTEM: --Common carotid artery: Widely patent origin without  common carotid artery dissection or aneurysm. --Internal carotid artery: Normal without aneurysm, dissection or stenosis. --External carotid artery: No acute abnormality. VERTEBRAL ARTERIES: Right dominant configuration. The origin of the right vertebral artery is normal. The left origin is obscured by streak artifact from contrast bolus. No dissection, occlusion or flow-limiting stenosis to the vertebrobasilar confluence. CTA HEAD FINDINGS POSTERIOR CIRCULATION: --Vertebral arteries: Normal right dominant configuration of V4 segments. --Posterior inferior cerebellar arteries (PICA): Both are patent. The right arises from the vertebral artery. The left shares a common origin with the left AICA. --Anterior inferior cerebellar arteries (AICA): Patent origins from the basilar artery. --Basilar artery: Normal. --Superior cerebellar arteries: Normal. --Posterior cerebral arteries (PCA): Normal. Both originate from the basilar artery. There is a small right posterior communicating artery. ANTERIOR CIRCULATION: --Intracranial internal carotid arteries: Normal. --Anterior cerebral arteries (ACA): Normal. Both A1 segments are present. Patent anterior communicating artery (a-comm). --Middle cerebral arteries (MCA): Normal. VENOUS SINUSES: As permitted by contrast timing, patent. ANATOMIC VARIANTS: None DELAYED PHASE: No parenchymal contrast enhancement. Review of the MIP images confirms the above findings. IMPRESSION: Normal CTA of the head and neck. Electronically Signed   By: Deatra Robinson M.D.   On: 11/22/2018 22:42   Ct Head Wo Contrast  Result Date: 11/22/2018 CLINICAL DATA:  Headache 2 weeks EXAM: CT HEAD WITHOUT CONTRAST TECHNIQUE: Contiguous axial images were obtained from the base of the skull through the vertex without intravenous contrast. COMPARISON:  CT head 01/11/2017 FINDINGS: Brain: No evidence of acute infarction, hemorrhage, hydrocephalus, extra-axial collection or mass lesion/mass effect. Vascular:  Negative for hyperdense vessel Skull: Negative Sinuses/Orbits: Mucosal edema left ethmoid sinus.  Normal orbit Other: None IMPRESSION: Negative CT head Mucosal edema left ethmoid sinus. Electronically Signed   By: Marlan Palau M.D.   On: 11/22/2018 19:48   Ct Angio Neck W And/or Wo Contrast  Result Date: 11/22/2018 CLINICAL DATA:  Headache for 2 weeks with intermittent vision loss. EXAM: CT ANGIOGRAPHY HEAD AND NECK TECHNIQUE: Multidetector CT imaging of the head and neck was performed using the standard protocol during bolus administration of intravenous contrast. Multiplanar CT image reconstructions and MIPs were obtained to evaluate the vascular anatomy. Carotid stenosis measurements (when applicable) are obtained utilizing NASCET criteria, using the distal internal carotid diameter as the denominator. CONTRAST:  OMNIPAQUE IOHEXOL 350 MG/ML SOLN COMPARISON:  Head CT 11/22/2018, 01/11/2017 FINDINGS: CTA NECK FINDINGS SKELETON: There is no bony spinal canal stenosis. No lytic or blastic lesion. OTHER NECK: Normal pharynx, larynx and major salivary glands. No cervical lymphadenopathy. Unremarkable thyroid gland. UPPER CHEST: No pneumothorax or pleural effusion. No nodules or masses. AORTIC ARCH: There is no calcific atherosclerosis of the aortic arch. There is no aneurysm, dissection or hemodynamically significant stenosis of the visualized ascending aorta and aortic arch. Conventional 3 vessel aortic branching pattern. The visualized proximal subclavian arteries are widely patent. RIGHT CAROTID SYSTEM: --Common carotid artery: Widely patent origin without common carotid artery dissection or aneurysm. --Internal carotid artery: Normal without aneurysm, dissection or stenosis. --External carotid artery: No acute abnormality. LEFT CAROTID SYSTEM: --Common carotid artery: Widely patent origin without common carotid artery dissection or aneurysm. --Internal carotid artery: Normal without aneurysm, dissection  or stenosis. --External carotid artery: No acute abnormality. VERTEBRAL ARTERIES: Right dominant configuration. The origin of the right vertebral artery is normal. The left origin is obscured by streak artifact from contrast bolus. No dissection, occlusion or flow-limiting stenosis to the vertebrobasilar confluence. CTA HEAD FINDINGS POSTERIOR CIRCULATION: --Vertebral arteries: Normal right dominant configuration of V4 segments. --Posterior inferior cerebellar arteries (PICA): Both are patent. The right arises from the vertebral artery. The left shares a common origin with the left AICA. --Anterior inferior cerebellar arteries (AICA): Patent origins from the basilar artery. --Basilar artery: Normal. --Superior cerebellar arteries: Normal. --Posterior cerebral arteries (PCA): Normal. Both originate from the basilar artery. There is a small right posterior communicating artery. ANTERIOR CIRCULATION: --Intracranial internal carotid arteries: Normal. --Anterior cerebral arteries (ACA): Normal. Both A1 segments are present. Patent anterior communicating artery (a-comm). --Middle cerebral arteries (MCA): Normal. VENOUS SINUSES: As permitted by contrast timing, patent. ANATOMIC VARIANTS: None DELAYED PHASE: No parenchymal contrast enhancement. Review of the MIP images confirms the above findings. IMPRESSION: Normal CTA of the head and neck. Electronically Signed   By: Deatra Robinson M.D.   On: 11/22/2018 22:42    Procedures Procedures (including critical care time)  Medications Ordered in ED Medications  tetracaine (PONTOCAINE) 0.5 % ophthalmic solution 2 drop (2 drops Right Eye Given by Other 11/22/18 2044)  sodium chloride 0.9 % bolus 1,000 mL (0 mLs Intravenous Stopped 11/22/18 2130)  iohexol (OMNIPAQUE) 350 MG/ML injection 100 mL (100 mLs Intravenous Contrast Given 11/22/18 2216)  potassium chloride SA (K-DUR,KLOR-CON) CR tablet 40 mEq (40 mEq Oral Given 11/23/18 0029)     Initial Impression / Assessment and Plan  / ED Course  I have reviewed the triage vital signs and the nursing notes.  Pertinent labs & imaging results that were available during my care of the patient were reviewed by me and considered in my medical decision making (see chart for details).        38yo female with history of DM presents with concern for right sided headache with right visual changes.  DDx includes glaucoma, temporal arteritis, aneurysm, CVA, migraine, tension headache, optic neuritis.  CT head WNL.  Hx not consistent with SAH. CTA without abnormalities.  IOP WNL. Doubt temporal arteritis given age.  Glucose elevated without signs of DKA< do not feel symptoms consistent with HHS.  Neurologic exam normal with exception of right pupillary abnormality and have low suspicion for CVA.  Given abnormal shape of pupil, suspect finding on exam consistent consistent with intraocular pathology.  She has equal bilateral visual acuity and history and exam not consistent with CRAO, CVAO.  Recommend  outpatient follow up with Ophthalmology and Neurology.   Final Clinical Impressions(s) / ED Diagnoses   Final diagnoses:  Acute nonintractable headache, unspecified headache type  Anisocoria    ED Discharge Orders    None       Alvira Monday, MD 11/24/18 0210

## 2018-11-22 NOTE — ED Triage Notes (Addendum)
Pt BIB POV and is c/o head pain x2 weeks. Pt stated that it is on the right parietal portion of her head. Pain is intermittent and when it starts it causes vision loss in the right eye for 15-20 seconds. No weakness noted.

## 2018-11-22 NOTE — ED Notes (Signed)
Patient attempting to give urine sample for poc preg

## 2018-11-22 NOTE — ED Notes (Signed)
20/50 in the left and right eye

## 2018-11-23 LAB — CBG MONITORING, ED: GLUCOSE-CAPILLARY: 301 mg/dL — AB (ref 70–99)

## 2019-05-01 ENCOUNTER — Emergency Department (HOSPITAL_COMMUNITY)
Admission: EM | Admit: 2019-05-01 | Discharge: 2019-05-01 | Discharge status: Absent without leave | Payer: BLUE CROSS/BLUE SHIELD

## 2020-02-13 ENCOUNTER — Other Ambulatory Visit: Payer: Self-pay

## 2020-02-13 ENCOUNTER — Encounter (HOSPITAL_COMMUNITY): Payer: Self-pay | Admitting: Emergency Medicine

## 2020-02-13 ENCOUNTER — Emergency Department (HOSPITAL_COMMUNITY)
Admission: EM | Admit: 2020-02-13 | Discharge: 2020-02-13 | Disposition: A | Discharge status: Home / Self Care | Payer: BLUE CROSS/BLUE SHIELD | Attending: Emergency Medicine | Admitting: Emergency Medicine

## 2020-02-13 DIAGNOSIS — L739 Follicular disorder, unspecified: Secondary | ICD-10-CM | POA: Diagnosis not present

## 2020-02-13 DIAGNOSIS — Z79899 Other long term (current) drug therapy: Secondary | ICD-10-CM | POA: Diagnosis not present

## 2020-02-13 DIAGNOSIS — Z794 Long term (current) use of insulin: Secondary | ICD-10-CM | POA: Diagnosis not present

## 2020-02-13 DIAGNOSIS — R220 Localized swelling, mass and lump, head: Secondary | ICD-10-CM | POA: Diagnosis present

## 2020-02-13 DIAGNOSIS — E119 Type 2 diabetes mellitus without complications: Secondary | ICD-10-CM | POA: Diagnosis not present

## 2020-02-13 MED ORDER — MUPIROCIN 2 % EX OINT: 1.0000 "application " | TOPICAL_OINTMENT | Freq: Two times a day (BID) | CUTANEOUS | 0 refills | Status: DC

## 2020-02-13 NOTE — ED Triage Notes (Signed)
Patient here from home reporting sores in head x1 month. Hx of diabetes. States that sores occur randomly in head, painful 3/10.

## 2020-02-13 NOTE — ED Notes (Signed)
An After Visit Summary was printed and given to the patient. Discharge instructions given and no further questions at this time.  

## 2020-02-13 NOTE — Discharge Instructions (Addendum)
I have provided a cream to put on your sores in case there is an infection.  Please follow-up with dermatology if this does not improve after using the cream.  Continue to take Benadryl for itching.  Return to the ER if your symptoms worsen.

## 2020-02-13 NOTE — ED Provider Notes (Signed)
[facility] DEPT Provider Note   CSN: [id number] Arrival date & time: 02/13/20  1019     History Chief Complaint  Patient presents with  . Abscess    [name] is a 39 y.o. female.  HPI 39 year old female DM type II, depression, anemia, hemoglobin C trait, high cholesterol presents to the ER for sores on her head that have been developing over the last month.  Patient states that the sores are tender to the touch and can get itchy at times.  She has never had these before.  She denies any bug bites, sun exposure, history of cancer, new shampoos.  She denies any fevers or chills.  No headaches or vision changes.  She has not tried anything for these sores.    Past Medical History:  Diagnosis Date  . Anemia   . Carpal tunnel syndrome   . Depression   . Diabetes mellitus without complication ([place])   . Diabetic neuropathy ([place])   . Hemoglobin C trait ([place])   . High cholesterol     There are no problems to display for this patient.   Past Surgical History:  Procedure Laterality Date  . CESAREAN SECTION    . MOUTH SURGERY    . TUBAL LIGATION       OB History   No obstetric history on file.     Family History  Problem Relation Age of Onset  . Diabetes Mother   . Hypertension Mother   . Hyperlipidemia Mother   . Cancer Father     Social History   Tobacco Use  . Smoking status: Never Smoker  . Smokeless tobacco: Never Used  Substance Use Topics  . Alcohol use: No  . Drug use: No    Home Medications Prior to Admission medications   Medication Sig Start Date End Date Taking? Authorizing Provider  lipase/protease/amylase (CREON) 36000 UNITS CPEP capsule Take 82,423-53,614 Units by mouth See admin instructions. Take 72000 units by mouth before and during each meals. Take 36000 units before and during each snack. Total of 3 meals and 2 snacks daily    [provider]  mupirocin ointment (BACTROBAN) 2 % Place 1  application into the nose 2 (two) times daily. 02/13/20   Garald Balding, PA-C  NOVOLOG MIX 70/30 FLEXPEN (70-30) 100 UNIT/ML FlexPen Inject 15-25 Units into the skin See admin instructions. Inject 25 units in the morning and 15 units at night 06/22/18   [provider]  traMADol (ULTRAM) 50 MG tablet Take 50 mg by mouth every 8 (eight) hours as needed for pain. 04/05/18   [provider]  TRULICITY 4.31 VQ/0.0QQ SOPN Inject 0.75 mg into the skin every Tuesday. 06/22/18   [provider]    Allergies    Shellfish allergy, Magnesium-containing compounds, and Tramadol  Review of Systems   Review of Systems  Constitutional: Negative for chills and fever.  Skin: Positive for wound.    Physical Exam Updated Vital Signs BP 123/84 (BP Location: Right Arm)   Pulse 89   Temp 98.9 F (37.2 C) (Oral)   Resp 19   SpO2 100%   Physical Exam Vitals and nursing note reviewed.  Constitutional:      General: She is not in acute distress.    Appearance: She is well-developed. She is not ill-appearing, toxic-appearing or diaphoretic.  HENT:     Head: Normocephalic and atraumatic.     Mouth/Throat:     Mouth: Mucous membranes are moist.  Pharynx: Oropharynx is clear.  Eyes:     Conjunctiva/sclera: Conjunctivae normal.  Cardiovascular:     Rate and Rhythm: Normal rate and regular rhythm.     Pulses: Normal pulses.     Heart sounds: Normal heart sounds. No murmur.  Pulmonary:     Effort: Pulmonary effort is normal. No respiratory distress.     Breath sounds: Normal breath sounds.  Abdominal:     General: Abdomen is flat.     Palpations: Abdomen is soft.     Tenderness: There is no abdominal tenderness.  Musculoskeletal:     Cervical back: Normal range of motion and neck supple.  Skin:    General: Skin is warm and dry.     Findings: Erythema and lesion present.     Comments: Scalp with small erythematous sores with crusting.  No fluctuance, duration.  No  evidence of abscess.  Mildly tender to palpation.  No evidence of bullae, sloughing.  Please see photos and physical exam.  Neurological:     General: No focal deficit present.     Mental Status: She is alert.     Sensory: No sensory deficit.     Motor: No weakness.  Psychiatric:        Mood and Affect: Mood normal.        Behavior: Behavior normal.           ED Results / Procedures / Treatments   Labs (all labs ordered are listed, but only abnormal results are displayed) Labs Reviewed - No data to display  EKG None  Radiology No results found.  Procedures Procedures (including critical care time)  Medications Ordered in ED Medications - No data to display  ED Course  I have reviewed the triage vital signs and the nursing notes.  Pertinent labs & imaging results that were available during my care of the patient were reviewed by me and considered in my medical decision making (see chart for details).    MDM Rules/Calculators/A&P                     39 year old female with sores to her forehead.  Patient denies any bug bites, sun exposures, new shampoos.  Denies fevers or chills.  No difficulty breathing or swallowing.  Patient has been airway without stridor and is daily secretions without difficulty, no angioedema.  No blisters, no pustules, no draining sinus tracts, no superficial abscess, no bullous impetigo, no vesicles, no desquamation, no target lesions with dusky purpura central bulla.  No concern for superimposed infection.  No concern for SJS, TEN, TSS, tickborne illness, syphilis, or other life-threatening condition.  Unclear cause, questionable folliculitis.  Will prescribe mupirocin cream.  Educated patient on Benadryl use for itching.  Referral to dermatology provided, encouraged her to follow-up.  Return precautions given.  I discussed case with Dr. Jeraldine Loots and he is agreeable to the above plan.  At this stage the patient is stable for discharge.   Final  Clinical Impression(s) / ED Diagnoses Final diagnoses:  Folliculitis    Rx / DC Orders ED Discharge Orders         Ordered    mupirocin ointment (BACTROBAN) 2 %  2 times daily     02/13/20 1119           Leone Brand 02/13/20 1133    Gerhard Munch, MD 02/14/20 [phone]

## 2020-07-31 ENCOUNTER — Other Ambulatory Visit: Payer: Self-pay

## 2020-07-31 ENCOUNTER — Encounter (HOSPITAL_COMMUNITY): Payer: Self-pay | Admitting: Emergency Medicine

## 2020-07-31 ENCOUNTER — Emergency Department (HOSPITAL_COMMUNITY)
Admission: EM | Admit: 2020-07-31 | Discharge: 2020-08-01 | Disposition: A | Discharge status: Home / Self Care | Payer: BLUE CROSS/BLUE SHIELD | Attending: Emergency Medicine | Admitting: Emergency Medicine

## 2020-07-31 DIAGNOSIS — R079 Chest pain, unspecified: Secondary | ICD-10-CM | POA: Diagnosis present

## 2020-07-31 DIAGNOSIS — Z794 Long term (current) use of insulin: Secondary | ICD-10-CM | POA: Insufficient documentation

## 2020-07-31 DIAGNOSIS — E1140 Type 2 diabetes mellitus with diabetic neuropathy, unspecified: Secondary | ICD-10-CM | POA: Insufficient documentation

## 2020-07-31 DIAGNOSIS — N390 Urinary tract infection, site not specified: Secondary | ICD-10-CM | POA: Insufficient documentation

## 2020-07-31 DIAGNOSIS — R739 Hyperglycemia, unspecified: Secondary | ICD-10-CM

## 2020-07-31 DIAGNOSIS — N3001 Acute cystitis with hematuria: Secondary | ICD-10-CM

## 2020-07-31 DIAGNOSIS — R5383 Other fatigue: Secondary | ICD-10-CM | POA: Diagnosis not present

## 2020-07-31 LAB — CBG MONITORING, ED: Glucose-Capillary: 548 mg/dL (ref 70–99)

## 2020-07-31 MED ORDER — SODIUM CHLORIDE 0.9 % IV BOLUS
1000.0000 mL | Freq: Once | INTRAVENOUS | Status: AC
  Administered 2020-08-01: 1000 mL via INTRAVENOUS

## 2020-07-31 NOTE — ED Triage Notes (Signed)
Pt arriving with complaint pf chest pain that has been present over the last couple weeks. Pt also reports increased fatigue over the last few days. Pt has medications for blood sugar and reports taking them as prescribed.

## 2020-08-01 ENCOUNTER — Emergency Department (HOSPITAL_COMMUNITY): Payer: BLUE CROSS/BLUE SHIELD

## 2020-08-01 LAB — URINALYSIS, ROUTINE W REFLEX MICROSCOPIC
Bilirubin Urine: NEGATIVE
Glucose, UA: >=500 mg/dL — AB
Ketones, ur: NEGATIVE mg/dL
Nitrite: NEGATIVE
Protein, ur: 30 mg/dL — AB
Specific Gravity, Urine: 1.025 (ref 1.005–1.030)
WBC, UA: >50 WBC/hpf — ABNORMAL HIGH (ref 0–5)
pH: 5 (ref 5.0–8.0)

## 2020-08-01 LAB — CBC WITH DIFFERENTIAL/PLATELET
Abs Immature Granulocytes: 0.01 10*3/uL (ref 0.00–0.07)
Basophils Absolute: 0 10*3/uL (ref 0.0–0.1)
Basophils Relative: 1 %
Eosinophils Absolute: 0.2 10*3/uL (ref 0.0–0.5)
Eosinophils Relative: 3 %
HCT: 29.2 % — ABNORMAL LOW (ref 36.0–46.0)
Hemoglobin: 9.8 g/dL — ABNORMAL LOW (ref 12.0–15.0)
Immature Granulocytes: 0 %
Lymphocytes Relative: 26 %
Lymphs Abs: 1.2 10*3/uL (ref 0.7–4.0)
MCH: 26.3 pg (ref 26.0–34.0)
MCHC: 33.6 g/dL (ref 30.0–36.0)
MCV: 78.5 fL — ABNORMAL LOW (ref 80.0–100.0)
Monocytes Absolute: 0.4 10*3/uL (ref 0.1–1.0)
Monocytes Relative: 8 %
Neutro Abs: 2.8 10*3/uL (ref 1.7–7.7)
Neutrophils Relative %: 62 %
Platelets: 248 10*3/uL (ref 150–400)
RBC: 3.72 MIL/uL — ABNORMAL LOW (ref 3.87–5.11)
RDW: 14.5 % (ref 11.5–15.5)
WBC: 4.6 10*3/uL (ref 4.0–10.5)
nRBC: 0 % (ref 0.0–0.2)

## 2020-08-01 LAB — COMPREHENSIVE METABOLIC PANEL
ALT: 19 U/L (ref 0–44)
AST: 17 U/L (ref 15–41)
Albumin: 2.8 g/dL — ABNORMAL LOW (ref 3.5–5.0)
Alkaline Phosphatase: 82 U/L (ref 38–126)
Anion gap: 9 (ref 5–15)
BUN: 18 mg/dL (ref 6–20)
CO2: 22 mmol/L (ref 22–32)
Calcium: 8.2 mg/dL — ABNORMAL LOW (ref 8.9–10.3)
Chloride: 98 mmol/L (ref 98–111)
Creatinine, Ser: 0.9 mg/dL (ref 0.44–1.00)
GFR, Estimated: >60 mL/min (ref >60)
Glucose, Bld: 622 mg/dL (ref 70–99)
Potassium: 4 mmol/L (ref 3.5–5.1)
Sodium: 129 mmol/L — ABNORMAL LOW (ref 135–145)
Total Bilirubin: 0.4 mg/dL (ref 0.3–1.2)
Total Protein: 6.9 g/dL (ref 6.5–8.1)

## 2020-08-01 LAB — TROPONIN I (HIGH SENSITIVITY)
Troponin I (High Sensitivity): <2 ng/L (ref <18)
Troponin I (High Sensitivity): <2 ng/L (ref <18)

## 2020-08-01 LAB — CBG MONITORING, ED: Glucose-Capillary: 318 mg/dL — ABNORMAL HIGH (ref 70–99)

## 2020-08-01 MED ORDER — INSULIN ASPART 100 UNIT/ML ~~LOC~~ SOLN
10.0000 [IU] | Freq: Once | SUBCUTANEOUS | Status: AC
  Administered 2020-08-01: 10 [IU] via INTRAVENOUS
  Filled 2020-08-01: qty 0.1

## 2020-08-01 MED ORDER — SODIUM CHLORIDE 0.9 % IV SOLN
1.0000 g | Freq: Once | INTRAVENOUS | Status: AC
  Administered 2020-08-01: 1 g via INTRAVENOUS
  Filled 2020-08-01: qty 10

## 2020-08-01 MED ORDER — CEPHALEXIN 500 MG PO CAPS: 500.0000 mg | ORAL_CAPSULE | Freq: Four times a day (QID) | ORAL | 0 refills | Status: DC

## 2020-08-01 NOTE — ED Provider Notes (Signed)
[facility]   CSN: [id number] Arrival date & time: 07/31/20  2241     History Chief Complaint  Patient presents with  . Hyperglycemia  . Chest Pain    [name] is a 39 y.o. female.  Patient with history of T2DM, HLD presents with complaint of chest pain. This is a longstanding issue for her, treated by her PCP at least since March of this year with Celebrex. She reports for the past week or so it has become constant. It affects the center of her chest and is worse with certain positions and with taking a forceful breath. She denies cough, fever, congestion. No SOB, DOE or orthopnea. She states she takes her insulin as prescribed but also that her blood sugar is usually high and "they are trying to adjust it for better control". No nausea, vomiting, diarrhea. She is COVID immunized. She reports as a separate issue that she has seen blood in her urine over the last day or so. No dysuria.   The history is provided by the patient. No language interpreter was used.  Hyperglycemia Associated symptoms: chest pain and fatigue   Associated symptoms: no abdominal pain, no confusion, no fever, no nausea, no shortness of breath, no vomiting and no weakness   Chest Pain Associated symptoms: fatigue   Associated symptoms: no abdominal pain, no cough, no fever, no nausea, no shortness of breath, no vomiting and no weakness        Past Medical History:  Diagnosis Date  . Anemia   . Carpal tunnel syndrome   . Depression   . Diabetes mellitus without complication (HCC)   . Diabetic neuropathy (HCC)   . Hemoglobin C trait (HCC)   . High cholesterol     There are no problems to display for this patient.   Past Surgical History:  Procedure Laterality Date  . CESAREAN SECTION    . MOUTH SURGERY    . TUBAL LIGATION       OB History   No obstetric history on file.     Family History  Problem Relation Age of Onset  . Diabetes  Mother   . Hypertension Mother   . Hyperlipidemia Mother   . Cancer Father     Social History   Tobacco Use  . Smoking status: Never Smoker  . Smokeless tobacco: Never Used  Vaping Use  . Vaping Use: Never used  Substance Use Topics  . Alcohol use: No  . Drug use: No    Home Medications Prior to Admission medications   Medication Sig Start Date End Date Taking? Authorizing Provider  lipase/protease/amylase (CREON) 36000 UNITS CPEP capsule Take 36,000-72,000 Units by mouth See admin instructions. Take 35597 units by mouth before and during each meals. Take 41638 units before and during each snack. Total of 3 meals and 2 snacks daily    [provider]  mupirocin ointment (BACTROBAN) 2 % Place 1 application into the nose 2 (two) times daily. 02/13/20   Mare Ferrari, PA-C  NOVOLOG MIX 70/30 FLEXPEN (70-30) 100 UNIT/ML FlexPen Inject 15-25 Units into the skin See admin instructions. Inject 25 units in the morning and 15 units at night 06/22/18   [provider]  traMADol (ULTRAM) 50 MG tablet Take 50 mg by mouth every 8 (eight) hours as needed for pain. 04/05/18   [provider]  TRULICITY 0.75 MG/0.5ML SOPN Inject 0.75 mg into the skin every Tuesday. 06/22/18   [provider]    Allergies    Shellfish allergy, Magnesium-containing compounds, and Tramadol  Review of Systems   Review of Systems  Constitutional: Positive for fatigue. Negative for chills and fever.  HENT: Negative.  Negative for congestion and sore throat.   Respiratory: Negative.  Negative for cough and shortness of breath.   Cardiovascular: Positive for chest pain.  Gastrointestinal: Negative.  Negative for abdominal pain, diarrhea, nausea and vomiting.  Genitourinary: Positive for hematuria. Negative for menstrual problem and pelvic pain.  Musculoskeletal: Negative.   Skin: Negative.  Negative for color change.  Neurological: Negative.  Negative for weakness.    Psychiatric/Behavioral: Negative for confusion.    Physical Exam Updated Vital Signs BP 124/83 (BP Location: Right Arm)   Pulse 88   Temp 98.3 F (36.8 C) (Oral)   Resp 16   Ht 5\' 3"  (1.6 m)   Wt 93.4 kg   LMP 07/26/2020   SpO2 99%   BMI 36.49 kg/m   Physical Exam Vitals and nursing note reviewed.  Constitutional:      Appearance: She is well-developed.  HENT:     Head: Normocephalic.  Eyes:     Conjunctiva/sclera: Conjunctivae normal.  Cardiovascular:     Rate and Rhythm: Normal rate and regular rhythm.     Heart sounds: No murmur heard.   Pulmonary:     Effort: Pulmonary effort is normal.     Breath sounds: Normal breath sounds. No wheezing, rhonchi or rales.  Chest:     Chest wall: Tenderness (Moderate to significant tenderness to center of chest) present.  Abdominal:     General: Bowel sounds are normal.     Palpations: Abdomen is soft.     Tenderness: There is no abdominal tenderness. There is no guarding or rebound.  Musculoskeletal:        General: Normal range of motion.     Cervical back: Normal range of motion and neck supple.  Skin:    General: Skin is warm and dry.     Findings: No rash.  Neurological:     General: No focal deficit present.     Mental Status: She is alert and oriented to person, place, and time.     ED Results / Procedures / Treatments   Labs (all labs ordered are listed, but only abnormal results are displayed) Labs Reviewed  CBG MONITORING, ED - Abnormal; Notable for the following components:      Result Value   Glucose-Capillary 548 (*)    All other components within normal limits  CBC WITH DIFFERENTIAL/PLATELET  COMPREHENSIVE METABOLIC PANEL  URINALYSIS, ROUTINE W REFLEX MICROSCOPIC  TROPONIN I (HIGH SENSITIVITY)   Results for orders placed or performed during the hospital encounter of 07/31/20  CBC with Differential  Result Value Ref Range   WBC 4.6 4.0 - 10.5 K/uL   RBC 3.72 (L) 3.87 - 5.11 MIL/uL   Hemoglobin  9.8 (L) 12.0 - 15.0 g/dL   HCT 16.129.2 (L) 36 - 46 %   MCV 78.5 (L) 80.0 - 100.0 fL   MCH 26.3 26.0 - 34.0 pg   MCHC 33.6 30.0 - 36.0 g/dL   RDW 09.614.5 04.511.5 - 40.915.5 %   Platelets 248 150 - 400 K/uL   nRBC 0.0 0.0 - 0.2 %   Neutrophils Relative % 62 %   Neutro Abs 2.8 1.7 - 7.7 K/uL   Lymphocytes Relative 26 %   Lymphs Abs 1.2 0.7 - 4.0 K/uL   Monocytes Relative 8 %  Monocytes Absolute 0.4 0.1 - 1.0 K/uL   Eosinophils Relative 3 %   Eosinophils Absolute 0.2 0.0 - 0.5 K/uL   Basophils Relative 1 %   Basophils Absolute 0.0 0.0 - 0.1 K/uL   Immature Granulocytes 0 %   Abs Immature Granulocytes 0.01 0.00 - 0.07 K/uL  Comprehensive metabolic panel  Result Value Ref Range   Sodium 129 (L) 135 - 145 mmol/L   Potassium 4.0 3.5 - 5.1 mmol/L   Chloride 98 98 - 111 mmol/L   CO2 22 22 - 32 mmol/L   Glucose, Bld 622 (HH) 70 - 99 mg/dL   BUN 18 6 - 20 mg/dL   Creatinine, Ser 6.23 0.44 - 1.00 mg/dL   Calcium 8.2 (L) 8.9 - 10.3 mg/dL   Total Protein 6.9 6.5 - 8.1 g/dL   Albumin 2.8 (L) 3.5 - 5.0 g/dL   AST 17 15 - 41 U/L   ALT 19 0 - 44 U/L   Alkaline Phosphatase 82 38 - 126 U/L   Total Bilirubin 0.4 0.3 - 1.2 mg/dL   GFR, Estimated >76 >28 mL/min   Anion gap 9 5 - 15  Urinalysis, Routine w reflex microscopic Urine, Clean Catch  Result Value Ref Range   Color, Urine RED (A) YELLOW   APPearance CLOUDY (A) CLEAR   Specific Gravity, Urine 1.025 1.005 - 1.030   pH 5.0 5.0 - 8.0   Glucose, UA >=500 (A) NEGATIVE mg/dL   Hgb urine dipstick MODERATE (A) NEGATIVE   Bilirubin Urine NEGATIVE NEGATIVE   Ketones, ur NEGATIVE NEGATIVE mg/dL   Protein, ur 30 (A) NEGATIVE mg/dL   Nitrite NEGATIVE NEGATIVE   Leukocytes,Ua MODERATE (A) NEGATIVE   RBC / HPF 21-50 0 - 5 RBC/hpf   WBC, UA >50 (H) 0 - 5 WBC/hpf   Bacteria, UA MANY (A) NONE SEEN   Squamous Epithelial / LPF 0-5 0 - 5   WBC Clumps PRESENT    Mucus PRESENT   CBG monitoring, ED  Result Value Ref Range   Glucose-Capillary 548 (HH) 70 - 99  mg/dL   Comment 1 Notify RN   CBG monitoring, ED  Result Value Ref Range   Glucose-Capillary 318 (H) 70 - 99 mg/dL  Troponin I (High Sensitivity)  Result Value Ref Range   Troponin I (High Sensitivity) <2 <18 ng/L  Troponin I (High Sensitivity)  Result Value Ref Range   Troponin I (High Sensitivity) <2 <18 ng/L    EKG None  Radiology No results found.  Procedures Procedures (including critical care time)  Medications Ordered in ED Medications  sodium chloride 0.9 % bolus 1,000 mL (1,000 mLs Intravenous New Bag/Given 08/01/20 0009)    ED Course  I have reviewed the triage vital signs and the nursing notes.  Pertinent labs & imaging results that were available during my care of the patient were reviewed by me and considered in my medical decision making (see chart for details).    MDM Rules/Calculators/A&P                          Patient with h/o T2DM here with c/o chest pain that is worse, more constant than her chronic chest pain. On Celebrex with no relief. Also c/o hematuria.   CBG found to be high at 548. She reports this is not unusual for her. No nausea/vomiting. No reported history of DKA. Chart reviewed. No recent A1c to review.  VS normal. She appears well. Doubt  DKA. Labs pending.   Chest wall is very tender, no SOB. This is a continuation of chronic pain, now more constant. Doubt ACS, dissection, PE, or infection. Labs, x-ray pending.   CGB decreases to 318, more c/w previous levels comparatively. No evidence DKA. She is found to have a UTI which would explain symptoms of general fatigue and possibly the higher CBG above her baseline. IV Rocephin provided.   She can be discharged home. She is encouraged to follow up with her doctor for recheck and to check her blood sugar more frequently until infection resolves.  Final Clinical Impression(s) / ED Diagnoses Final diagnoses:  None   1. Fatigue 2. Hyperglycemia without DKA 3. UTI  Rx / DC Orders ED  Discharge Orders    None       Elpidio Anis, PA-C 08/01/20 6811    Geoffery Lyons, MD 08/01/20 [phone]

## 2020-08-01 NOTE — Discharge Instructions (Signed)
Take Keflex as directed to treat urinary tract infection until gone. Please follow up with your doctor for recheck in 5-7 days to insure infection has cleared and symptoms are improved/resolved.  Your blood sugar was significantly elevated. During treatment of your urinary tract infection, you should check your blood sugar more frequently. Check with your doctor regarding your insulin treatment dosing if it remains high.   Return to the emergency department with any new or worsening symptoms.

## 2020-08-01 NOTE — ED Notes (Signed)
Date and time results received: 08/01/20 1:03 AM  Test: Glucose Critical Value: 622  Name of Provider Notified: [place], EDP

## 2020-08-25 ENCOUNTER — Other Ambulatory Visit: Payer: Self-pay

## 2020-08-25 ENCOUNTER — Emergency Department (HOSPITAL_COMMUNITY)
Admission: EM | Admit: 2020-08-25 | Discharge: 2020-08-25 | Disposition: A | Discharge status: Home / Self Care | Payer: BLUE CROSS/BLUE SHIELD | Attending: Emergency Medicine | Admitting: Emergency Medicine

## 2020-08-25 DIAGNOSIS — Z20822 Contact with and (suspected) exposure to covid-19: Secondary | ICD-10-CM | POA: Diagnosis not present

## 2020-08-25 DIAGNOSIS — Z79899 Other long term (current) drug therapy: Secondary | ICD-10-CM | POA: Diagnosis not present

## 2020-08-25 DIAGNOSIS — E11649 Type 2 diabetes mellitus with hypoglycemia without coma: Secondary | ICD-10-CM | POA: Insufficient documentation

## 2020-08-25 DIAGNOSIS — R197 Diarrhea, unspecified: Secondary | ICD-10-CM | POA: Diagnosis not present

## 2020-08-25 DIAGNOSIS — N390 Urinary tract infection, site not specified: Secondary | ICD-10-CM | POA: Diagnosis not present

## 2020-08-25 DIAGNOSIS — R739 Hyperglycemia, unspecified: Secondary | ICD-10-CM

## 2020-08-25 DIAGNOSIS — R1110 Vomiting, unspecified: Secondary | ICD-10-CM | POA: Diagnosis present

## 2020-08-25 LAB — COMPREHENSIVE METABOLIC PANEL
ALT: 19 U/L (ref 0–44)
AST: 16 U/L (ref 15–41)
Albumin: 2.7 g/dL — ABNORMAL LOW (ref 3.5–5.0)
Alkaline Phosphatase: 64 U/L (ref 38–126)
Anion gap: 7 (ref 5–15)
BUN: 15 mg/dL (ref 6–20)
CO2: 21 mmol/L — ABNORMAL LOW (ref 22–32)
Calcium: 8 mg/dL — ABNORMAL LOW (ref 8.9–10.3)
Chloride: 105 mmol/L (ref 98–111)
Creatinine, Ser: 0.93 mg/dL (ref 0.44–1.00)
GFR, Estimated: >60 mL/min (ref >60)
Glucose, Bld: 353 mg/dL — ABNORMAL HIGH (ref 70–99)
Potassium: 3.3 mmol/L — ABNORMAL LOW (ref 3.5–5.1)
Sodium: 133 mmol/L — ABNORMAL LOW (ref 135–145)
Total Bilirubin: 0.6 mg/dL (ref 0.3–1.2)
Total Protein: 6.2 g/dL — ABNORMAL LOW (ref 6.5–8.1)

## 2020-08-25 LAB — URINALYSIS, ROUTINE W REFLEX MICROSCOPIC
Bilirubin Urine: NEGATIVE
Glucose, UA: >=500 mg/dL — AB
Ketones, ur: NEGATIVE mg/dL
Nitrite: POSITIVE — AB
Protein, ur: 100 mg/dL — AB
Specific Gravity, Urine: 1.027 (ref 1.005–1.030)
pH: 6 (ref 5.0–8.0)

## 2020-08-25 LAB — CBC
HCT: 27.6 % — ABNORMAL LOW (ref 36.0–46.0)
Hemoglobin: 9.5 g/dL — ABNORMAL LOW (ref 12.0–15.0)
MCH: 27.1 pg (ref 26.0–34.0)
MCHC: 34.4 g/dL (ref 30.0–36.0)
MCV: 78.6 fL — ABNORMAL LOW (ref 80.0–100.0)
Platelets: 196 10*3/uL (ref 150–400)
RBC: 3.51 MIL/uL — ABNORMAL LOW (ref 3.87–5.11)
RDW: 14.7 % (ref 11.5–15.5)
WBC: 3.6 10*3/uL — ABNORMAL LOW (ref 4.0–10.5)
nRBC: 0 % (ref 0.0–0.2)

## 2020-08-25 LAB — RESP PANEL BY RT-PCR (FLU A&B, COVID) ARPGX2
Influenza A by PCR: NEGATIVE
Influenza B by PCR: NEGATIVE
SARS Coronavirus 2 by RT PCR: NEGATIVE

## 2020-08-25 LAB — I-STAT BETA HCG BLOOD, ED (MC, WL, AP ONLY): I-stat hCG, quantitative: <5 m[IU]/mL (ref <5)

## 2020-08-25 LAB — LIPASE, BLOOD: Lipase: 33 U/L (ref 11–51)

## 2020-08-25 LAB — CBG MONITORING, ED: Glucose-Capillary: 235 mg/dL — ABNORMAL HIGH (ref 70–99)

## 2020-08-25 MED ORDER — HYDROMORPHONE HCL 1 MG/ML IJ SOLN
1.0000 mg | Freq: Once | INTRAMUSCULAR | Status: AC
  Administered 2020-08-25: 1 mg via INTRAVENOUS
  Filled 2020-08-25: qty 1

## 2020-08-25 MED ORDER — INSULIN ASPART 100 UNIT/ML ~~LOC~~ SOLN
10.0000 [IU] | Freq: Once | SUBCUTANEOUS | Status: AC
  Administered 2020-08-25: 10 [IU] via SUBCUTANEOUS
  Filled 2020-08-25: qty 0.1

## 2020-08-25 MED ORDER — SODIUM CHLORIDE 0.9 % IV BOLUS
1000.0000 mL | Freq: Once | INTRAVENOUS | Status: AC
  Administered 2020-08-25: 1000 mL via INTRAVENOUS

## 2020-08-25 MED ORDER — ONDANSETRON HCL 4 MG/2ML IJ SOLN
4.0000 mg | Freq: Once | INTRAMUSCULAR | Status: AC
  Administered 2020-08-25: 4 mg via INTRAVENOUS
  Filled 2020-08-25: qty 2

## 2020-08-25 MED ORDER — CEPHALEXIN 500 MG PO CAPS: 500.0000 mg | ORAL_CAPSULE | Freq: Four times a day (QID) | ORAL | 0 refills | Status: AC

## 2020-08-25 MED ORDER — ONDANSETRON HCL 4 MG/2ML IJ SOLN: 4.0000 mg | Freq: Once | INTRAMUSCULAR | Status: DC

## 2020-08-25 MED ORDER — CEPHALEXIN 500 MG PO CAPS
500.0000 mg | ORAL_CAPSULE | Freq: Once | ORAL | Status: AC
  Administered 2020-08-25: 500 mg via ORAL
  Filled 2020-08-25: qty 1

## 2020-08-25 NOTE — ED Provider Notes (Signed)
[facility] Provider Note   CSN: [id number] Arrival date & time: 08/25/20  7989     History Chief Complaint  Patient presents with  . Emesis  . Diarrhea    [name] is a 39 y.o. female.  The history is provided by the patient. No language interpreter was used.  Emesis Severity:  Moderate Duration:  3 days Timing:  Constant Progression:  Worsening Chronicity:  New Recent urination:  Normal Relieved by:  Nothing Worsened by:  Nothing Ineffective treatments:  None tried Associated symptoms: diarrhea   Risk factors: no sick contacts   Diarrhea Associated symptoms: vomiting    Pt complains of vomiting and diarrhea.  Pt reports she is daibetic and her glucose has been high.     Past Medical History:  Diagnosis Date  . Anemia   . Carpal tunnel syndrome   . Depression   . Diabetes mellitus without complication (HCC)   . Diabetic neuropathy (HCC)   . Hemoglobin C trait (HCC)   . High cholesterol     There are no problems to display for this patient.   Past Surgical History:  Procedure Laterality Date  . CESAREAN SECTION    . MOUTH SURGERY    . TUBAL LIGATION       OB History   No obstetric history on file.     Family History  Problem Relation Age of Onset  . Diabetes Mother   . Hypertension Mother   . Hyperlipidemia Mother   . Cancer Father     Social History   Tobacco Use  . Smoking status: Never Smoker  . Smokeless tobacco: Never Used  Vaping Use  . Vaping Use: Never used  Substance Use Topics  . Alcohol use: No  . Drug use: No    Home Medications Prior to Admission medications   Medication Sig Start Date End Date Taking? Authorizing Provider  celecoxib (CELEBREX) 200 MG capsule Take 200 mg by mouth daily as needed for mild pain.  07/18/20  Yes [provider]  empagliflozin (JARDIANCE) 25 MG TABS tablet Take 25 mg by mouth daily.   Yes [provider]  pregabalin (LYRICA) 150  MG capsule Take 150 mg by mouth 2 (two) times daily. 05/07/20  Yes [provider]  TRULICITY 0.75 MG/0.5ML SOPN Inject 0.75 mg into the skin every Tuesday. 06/22/18  Yes [provider]  cephALEXin (KEFLEX) 500 MG capsule Take 1 capsule (500 mg total) by mouth 4 (four) times daily for 7 days. 08/25/20 09/01/20  Elson Areas, PA-C  cephALEXin (KEFLEX) 500 MG capsule Take 1 capsule (500 mg total) by mouth 4 (four) times daily for 10 days. 08/25/20 09/04/20  Elson Areas, PA-C  mupirocin ointment (BACTROBAN) 2 % Place 1 application into the nose 2 (two) times daily. Patient not taking: Reported on 08/01/2020 02/13/20   Mare Ferrari, PA-C    Allergies    Shellfish allergy, Magnesium-containing compounds, and Tramadol  Review of Systems   Review of Systems  Gastrointestinal: Positive for diarrhea and vomiting.  All other systems reviewed and are negative.   Physical Exam Updated Vital Signs BP 117/78   Pulse 88   Temp 98.6 F (37 C) (Oral)   Resp 16   LMP 07/26/2020   SpO2 96%   Physical Exam Vitals and nursing note reviewed.  Constitutional:      Appearance: She is well-developed.  HENT:     Head: Normocephalic.  Cardiovascular:  Rate and Rhythm: Normal rate and regular rhythm.  Pulmonary:     Effort: Pulmonary effort is normal.  Abdominal:     General: Abdomen is flat. There is no distension.  Musculoskeletal:        General: Normal range of motion.     Cervical back: Normal range of motion and neck supple.  Skin:    General: Skin is warm.  Neurological:     General: No focal deficit present.     Mental Status: She is alert and oriented to person, place, and time.  Psychiatric:        Mood and Affect: Mood normal.     ED Results / Procedures / Treatments   Labs (all labs ordered are listed, but only abnormal results are displayed) Labs Reviewed  COMPREHENSIVE METABOLIC PANEL - Abnormal; Notable for the following components:      Result  Value   Sodium 133 (*)    Potassium 3.3 (*)    CO2 21 (*)    Glucose, Bld 353 (*)    Calcium 8.0 (*)    Total Protein 6.2 (*)    Albumin 2.7 (*)    All other components within normal limits  CBC - Abnormal; Notable for the following components:   WBC 3.6 (*)    RBC 3.51 (*)    Hemoglobin 9.5 (*)    HCT 27.6 (*)    MCV 78.6 (*)    All other components within normal limits  URINALYSIS, ROUTINE W REFLEX MICROSCOPIC - Abnormal; Notable for the following components:   APPearance HAZY (*)    Glucose, UA >=500 (*)    Hgb urine dipstick LARGE (*)    Protein, ur 100 (*)    Nitrite POSITIVE (*)    Leukocytes,Ua TRACE (*)    Bacteria, UA MANY (*)    All other components within normal limits  CBG MONITORING, ED - Abnormal; Notable for the following components:   Glucose-Capillary 235 (*)    All other components within normal limits  RESP PANEL BY RT-PCR (FLU A&B, COVID) ARPGX2  LIPASE, BLOOD  I-STAT BETA HCG BLOOD, ED ([facility], [facility], [facility] ONLY)    EKG None  Radiology No results found.  Procedures Procedures (including critical care time)  Medications Ordered in ED Medications  ondansetron (ZOFRAN) injection 4 mg (4 mg Intravenous Not Given 08/25/20 1120)  sodium chloride 0.9 % bolus 1,000 mL (0 mLs Intravenous Stopped 08/25/20 1100)  ondansetron (ZOFRAN) injection 4 mg (4 mg Intravenous Given 08/25/20 1018)  HYDROmorphone (DILAUDID) injection 1 mg (1 mg Intravenous Given 08/25/20 1057)  insulin aspart (novoLOG) injection 10 Units (10 Units Subcutaneous Given 08/25/20 1306)  cephALEXin (KEFLEX) capsule 500 mg (500 mg Oral Given 08/25/20 1400)    ED Course  I have reviewed the triage vital signs and the nursing notes.  Pertinent labs & imaging results that were available during my care of the patient were reviewed by me and considered in my medical decision making (see chart for details).    MDM Rules/Calculators/A&P                          MDM:  Pt given iv fluids and zofran.  Pt  able to tolerate fluids.  Pt given Insulin subq.  Pt had decreased glucose.  Ua shows greater than 20 wbc's Keflex given here. Rx for keflex.  Final Clinical Impression(s) / ED Diagnoses Final diagnoses:  Urinary tract infection without hematuria, site unspecified  Hyperglycemia    Rx / DC Orders ED Discharge Orders         Ordered    cephALEXin (KEFLEX) 500 MG capsule  4 times daily        08/25/20 1426    cephALEXin (KEFLEX) 500 MG capsule  4 times daily        08/25/20 1440        An After Visit Summary was printed and given to the patient.    Elson Areas, [place] 08/25/20 1623    Lorre Nick, MD 08/26/20 1344

## 2020-08-25 NOTE — ED Triage Notes (Signed)
Patient reports for the last 3 days she has had nausea, emesis, and diarrhea. Patient reports she feels overall sick. No known fevers at home. Recent fall in her showed, denies hitting her head or being on blood thinners.

## 2020-08-25 NOTE — Discharge Instructions (Signed)
Continue home medications  antibiotics as directed.

## 2020-12-10 ENCOUNTER — Other Ambulatory Visit: Payer: Self-pay

## 2020-12-10 ENCOUNTER — Encounter (HOSPITAL_COMMUNITY): Payer: Self-pay

## 2020-12-10 ENCOUNTER — Emergency Department (HOSPITAL_COMMUNITY)
Admission: EM | Admit: 2020-12-10 | Discharge: 2020-12-11 | Disposition: A | Discharge status: Home / Self Care | Payer: BC Managed Care – PPO | Attending: Emergency Medicine | Admitting: Emergency Medicine

## 2020-12-10 DIAGNOSIS — R040 Epistaxis: Secondary | ICD-10-CM | POA: Diagnosis not present

## 2020-12-10 DIAGNOSIS — Z794 Long term (current) use of insulin: Secondary | ICD-10-CM | POA: Diagnosis not present

## 2020-12-10 DIAGNOSIS — Z7984 Long term (current) use of oral hypoglycemic drugs: Secondary | ICD-10-CM | POA: Diagnosis not present

## 2020-12-10 DIAGNOSIS — E1140 Type 2 diabetes mellitus with diabetic neuropathy, unspecified: Secondary | ICD-10-CM | POA: Diagnosis not present

## 2020-12-10 DIAGNOSIS — R519 Headache, unspecified: Secondary | ICD-10-CM | POA: Diagnosis not present

## 2020-12-10 LAB — CBC WITH DIFFERENTIAL/PLATELET
Abs Immature Granulocytes: 0.01 10*3/uL (ref 0.00–0.07)
Basophils Absolute: 0 10*3/uL (ref 0.0–0.1)
Basophils Relative: 1 %
Eosinophils Absolute: 0.3 10*3/uL (ref 0.0–0.5)
Eosinophils Relative: 5 %
HCT: 29.8 % — ABNORMAL LOW (ref 36.0–46.0)
Hemoglobin: 10.4 g/dL — ABNORMAL LOW (ref 12.0–15.0)
Immature Granulocytes: 0 %
Lymphocytes Relative: 33 %
Lymphs Abs: 1.9 10*3/uL (ref 0.7–4.0)
MCH: 27.2 pg (ref 26.0–34.0)
MCHC: 34.9 g/dL (ref 30.0–36.0)
MCV: 78 fL — ABNORMAL LOW (ref 80.0–100.0)
Monocytes Absolute: 0.5 10*3/uL (ref 0.1–1.0)
Monocytes Relative: 8 %
Neutro Abs: 3.2 10*3/uL (ref 1.7–7.7)
Neutrophils Relative %: 53 %
Platelets: 264 10*3/uL (ref 150–400)
RBC: 3.82 MIL/uL — ABNORMAL LOW (ref 3.87–5.11)
RDW: 15 % (ref 11.5–15.5)
WBC: 5.9 10*3/uL (ref 4.0–10.5)
nRBC: 0 % (ref 0.0–0.2)

## 2020-12-10 LAB — BASIC METABOLIC PANEL
Anion gap: 5 (ref 5–15)
BUN: 24 mg/dL — ABNORMAL HIGH (ref 6–20)
CO2: 24 mmol/L (ref 22–32)
Calcium: 8.7 mg/dL — ABNORMAL LOW (ref 8.9–10.3)
Chloride: 110 mmol/L (ref 98–111)
Creatinine, Ser: 1.31 mg/dL — ABNORMAL HIGH (ref 0.44–1.00)
GFR, Estimated: 53 mL/min — ABNORMAL LOW (ref >60)
Glucose, Bld: 108 mg/dL — ABNORMAL HIGH (ref 70–99)
Potassium: 3.6 mmol/L (ref 3.5–5.1)
Sodium: 139 mmol/L (ref 135–145)

## 2020-12-10 LAB — I-STAT BETA HCG BLOOD, ED (MC, WL, AP ONLY): I-stat hCG, quantitative: <5 m[IU]/mL (ref <5)

## 2020-12-10 MED ORDER — ACETAMINOPHEN 500 MG PO TABS
1000.0000 mg | ORAL_TABLET | Freq: Once | ORAL | Status: AC
  Administered 2020-12-10: 1000 mg via ORAL
  Filled 2020-12-10: qty 2

## 2020-12-10 MED ORDER — KETOROLAC TROMETHAMINE 30 MG/ML IJ SOLN
30.0000 mg | Freq: Once | INTRAMUSCULAR | Status: AC
  Administered 2020-12-11: 30 mg via INTRAVENOUS
  Filled 2020-12-10: qty 1

## 2020-12-10 MED ORDER — METOCLOPRAMIDE HCL 5 MG/ML IJ SOLN
5.0000 mg | Freq: Once | INTRAMUSCULAR | Status: AC
  Administered 2020-12-11: 5 mg via INTRAVENOUS
  Filled 2020-12-10: qty 2

## 2020-12-10 MED ORDER — DIPHENHYDRAMINE HCL 50 MG/ML IJ SOLN
25.0000 mg | Freq: Once | INTRAMUSCULAR | Status: AC
  Administered 2020-12-11: 25 mg via INTRAVENOUS
  Filled 2020-12-10: qty 1

## 2020-12-10 MED ORDER — SODIUM CHLORIDE 0.9 % IV BOLUS
1000.0000 mL | Freq: Once | INTRAVENOUS | Status: AC
  Administered 2020-12-10: 1000 mL via INTRAVENOUS

## 2020-12-10 NOTE — Discharge Instructions (Addendum)
You have been seen and discharged from the emergency department.  Your blood levels showed no anemia, there was dehydration.  You were hydrated in the department and treated symptomatically for headache.  Follow-up with your primary provider and ENT for reevaluation.  Use over-the-counter AYR saline gel in the nose at night and in the morning for consistent moisturizing to decrease the possibility of nosebleeds. In addition to direct pressure you can buy over-the-counter Afrin and cotton swabs.  If you get a nosebleed that is persisting you can saturate the cotton balls with Afrin and stick them up your nose with pressure to help stop bleeding.  Take home medications as prescribed. If you have any worsening symptoms or further concerns for health please return to an emergency department for further evaluation.

## 2020-12-10 NOTE — ED Notes (Signed)
Pt reports nose bleeds multiple times throughout the day for 4 days.

## 2020-12-10 NOTE — ED Triage Notes (Addendum)
Patient states she has been having nosebleeds x 4 days and a headache x 5 days.  Patient states one of the episodes yesterday lasted approx 45 minutes. patient states she has had 3 episodes today, but currently no bleeding in triage.

## 2020-12-10 NOTE — ED Provider Notes (Signed)
[facility]-EMERGENCY DEPT Provider Note   CSN: [id number] Arrival date & time: 12/10/20  1654     History Chief Complaint  Patient presents with  . Epistaxis  . Headache    [name] is a 40 y.o. female.  HPI   40 year old female with past medical history of epistaxis, migraines, DM, HLD presents the emergency department with concern for frequent nosebleeds and headache.  Patient states around this time of year she usually gets dry nose.  She has had nosebleeds in the past.  Over the past 4 days she is had a nosebleed every day and today she had 3.  She states 1 episode bled for 45 minutes but did subside with pressure.  She has no ongoing bleeding.  She is not anticoagulated.  She has had a mild headache for the past couple days that worsened today after the nosebleed.  It is primarily right-sided, facial/sinus.  Denies any chest pain, shortness of breath, lightheadedness, syncope.  No recent fever, no neck stiffness.  The headache was gradual in onset, not worse at night, does self improve.  Past Medical History:  Diagnosis Date  . Anemia   . Carpal tunnel syndrome   . Depression   . Diabetes mellitus without complication (HCC)   . Diabetic neuropathy (HCC)   . Hemoglobin C trait (HCC)   . High cholesterol     There are no problems to display for this patient.   Past Surgical History:  Procedure Laterality Date  . CESAREAN SECTION    . EYE SURGERY Bilateral   . MOUTH SURGERY    . 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 TUBAL LIGATION       OB History   No obstetric history on file.     Family History  Problem Relation Age of Onset  . Diabetes Mother   . Hypertension Mother   . Hyperlipidemia Mother   . Cancer Father     Social History   Tobacco Use  . Smoking status: Never Smoker  . Smokeless tobacco: Never Used  Vaping Use  . Vaping Use: Never used  Substance Use Topics  . Alcohol use: No  . Drug use: No    Home  Medications Prior to Admission medications   Medication Sig Start Date End Date Taking? Authorizing Provider  atorvastatin (LIPITOR) 40 MG tablet Take 40 mg by mouth daily.   Yes [provider]  baclofen (LIORESAL) 10 MG tablet Take 10 mg by mouth at bedtime.   Yes [provider]  celecoxib (CELEBREX) 200 MG capsule Take 200 mg by mouth daily as needed for mild pain.  07/18/20  Yes [provider]  empagliflozin (JARDIANCE) 25 MG TABS tablet Take 25 mg by mouth daily.   Yes [provider]  famotidine (PEPCID) 20 MG tablet Take 20 mg by mouth 2 (two) times daily as needed for heartburn or indigestion.   Yes [provider]  Ferrous Sulfate (IRON PO) Take 1 tablet by mouth daily.   Yes [provider]  furosemide (LASIX) 40 MG tablet Take 40 mg by mouth daily.   Yes [provider]  insulin degludec (TRESIBA) 100 UNIT/ML FlexTouch Pen Inject 15 Units into the skin daily.   Yes [provider]  lipase/protease/amylase (CREON) 36000 UNITS CPEP capsule Take 72,000-144,000 Units by mouth as directed. Take 4 capsules (144,000 units) with meals & Take 2 capsules (72,000 units) with snacks 10/29/20  Yes [provider]  polyethylene glycol (MIRALAX / GLYCOLAX) 17 g packet Take 17 g by mouth daily.   Yes [provider]  pregabalin (LYRICA) 150 MG capsule Take 150 mg by mouth 2 (two) times daily. 05/07/20  Yes [provider]  sertraline (ZOLOFT) 50 MG tablet Take 50 mg by mouth daily.   Yes [provider]  mupirocin ointment (BACTROBAN) 2 % Place 1 application into the nose 2 (two) times daily. Patient not taking: No sig reported 02/13/20   Mare Ferrari, PA-C  TRULICITY 0.75 MG/0.5ML SOPN Inject 0.75 mg into the skin every Tuesday. 06/22/18   [provider]    Allergies    Shellfish allergy, Magnesium-containing compounds, and Tramadol  Review of Systems   Review of Systems   Constitutional: Positive for fatigue. Negative for fever.  HENT: Positive for congestion and nosebleeds.   Respiratory: Negative for shortness of breath.   Cardiovascular: Negative for chest pain.  Gastrointestinal: Negative for abdominal pain, diarrhea and vomiting.  Genitourinary: Negative for dysuria.  Skin: Negative for rash.  Neurological: Positive for headaches. Negative for dizziness, syncope and light-headedness.    Physical Exam Updated Vital Signs BP (!) 156/98   Pulse 82   Temp 98.7 F (37.1 C) (Oral)   Resp 16   Ht 5\' 3"  (1.6 m)   Wt 98.9 kg   LMP 11/15/2020 (Approximate)   SpO2 100%   BMI 38.62 kg/m   Physical Exam Vitals and nursing note reviewed.  Constitutional:      Appearance: Normal appearance.  HENT:     Head: Normocephalic.     Comments: No active epistaxis    Mouth/Throat:     Mouth: Mucous membranes are moist.  Cardiovascular:     Rate and Rhythm: Normal rate.  Pulmonary:     Effort: Pulmonary effort is normal. No respiratory distress.  Abdominal:     Palpations: Abdomen is soft.     Tenderness: There is no abdominal tenderness.  Skin:    General: Skin is warm.  Neurological:     Mental Status: She is alert and oriented to person, place, and time. Mental status is at baseline.  Psychiatric:        Mood and Affect: Mood normal.     ED Results / Procedures / Treatments   Labs (all labs ordered are listed, but only abnormal results are displayed) Labs Reviewed  CBC WITH DIFFERENTIAL/PLATELET - Abnormal; Notable for the following components:      Result Value   RBC 3.82 (*)    Hemoglobin 10.4 (*)    HCT 29.8 (*)    MCV 78.0 (*)    All other components within normal limits  BASIC METABOLIC PANEL - Abnormal; Notable for the following components:   Glucose, Bld 108 (*)    BUN 24 (*)    Creatinine, Ser 1.31 (*)    Calcium 8.7 (*)    GFR, Estimated 53 (*)    All other components within normal limits  I-STAT BETA HCG BLOOD, ED ([facility], [facility],  [facility] ONLY)    EKG None  Radiology No results found.  Procedures Procedures   Medications Ordered in ED Medications - No data to display  ED Course  I have reviewed the triage vital signs and the nursing notes.  Pertinent labs & imaging results that were available during my care of the patient were reviewed by me and considered in my medical decision making (see chart for details).    MDM Rules/Calculators/A&P  40 year old female presents the emergency department with recurrent epistaxis and headache.  Vitals are stable on arrival, she is sitting up and well-appearing.  No active epistaxis.  Blood work shows no acute anemia, there is mild AKI/dehydration in her blood work.  Patient was hydrated and given Tylenol, initially had mild improvement however on reevaluation is still complaining of right-sided headache/migraine.  We will proceed with migraine cocktail and reevaluate.  Currently no red flag symptoms to warrant head imaging.  If patient improves with medications I anticipate discharge to follow-up with primary doctor and ENT.  Educated the patient on using saline/gel to moisturize the nose to avoid epistaxis.  Final Clinical Impression(s) / ED Diagnoses Final diagnoses:  None    Rx / DC Orders ED Discharge Orders    None       Rozelle Logan, DO 12/10/20 2353

## 2020-12-11 MED ORDER — PROCHLORPERAZINE EDISYLATE 10 MG/2ML IJ SOLN
10.0000 mg | Freq: Once | INTRAMUSCULAR | Status: AC
  Administered 2020-12-11: 10 mg via INTRAVENOUS
  Filled 2020-12-11: qty 2

## 2020-12-11 MED ORDER — DIPHENHYDRAMINE HCL 50 MG/ML IJ SOLN
25.0000 mg | Freq: Once | INTRAMUSCULAR | Status: AC
  Administered 2020-12-11: 25 mg via INTRAVENOUS
  Filled 2020-12-11: qty 1

## 2020-12-11 NOTE — ED Provider Notes (Signed)
I received the patient in signout from Dr. Wilkie Aye, briefly the patient is a 40 year old female who is complaining of epistaxis and a headache.  The epistaxis has resolved but due to a persistent headache she was given a headache cocktail.  Plan for reassessment post headache cocktail.  The patient was complaining of some side effects to the Reglan and so she did not actually get a full dose earlier.  On reassessment she still complaining of a unchanged headache.  We will give a dose of Compazine and reassess.  Patient feeling better on reassessment and requesting discharge home.   Melene Plan, DO 12/11/20 0201

## 2020-12-11 NOTE — ED Notes (Signed)
Pt c/o that IV is hurting her, flushed IV with no difficulty. Pt reports pain so IV was removed

## 2020-12-11 NOTE — ED Notes (Signed)
After administering benadryl that was diluted in 8cc of fluid, pt began c/o of "weird feeelings" in her left hand and arm and it went through her entire body. Medication was pushed over 2 minutes. Reglan was held due to the complaints after the benadryl. MD notified

## 2021-02-26 ENCOUNTER — Ambulatory Visit (HOSPITAL_COMMUNITY): Payer: BC Managed Care – PPO | Admitting: Licensed Clinical Social Worker

## 2021-03-25 ENCOUNTER — Encounter (HOSPITAL_COMMUNITY): Payer: Self-pay

## 2021-03-25 ENCOUNTER — Other Ambulatory Visit: Payer: Self-pay

## 2021-03-25 ENCOUNTER — Emergency Department (HOSPITAL_COMMUNITY)
Admission: EM | Admit: 2021-03-25 | Discharge: 2021-03-26 | Disposition: A | Discharge status: Home / Self Care | Payer: BC Managed Care – PPO | Attending: Emergency Medicine | Admitting: Emergency Medicine

## 2021-03-25 DIAGNOSIS — Z79899 Other long term (current) drug therapy: Secondary | ICD-10-CM | POA: Insufficient documentation

## 2021-03-25 DIAGNOSIS — X58XXXA Exposure to other specified factors, initial encounter: Secondary | ICD-10-CM | POA: Insufficient documentation

## 2021-03-25 DIAGNOSIS — N179 Acute kidney failure, unspecified: Secondary | ICD-10-CM | POA: Insufficient documentation

## 2021-03-25 DIAGNOSIS — E871 Hypo-osmolality and hyponatremia: Secondary | ICD-10-CM | POA: Insufficient documentation

## 2021-03-25 DIAGNOSIS — Z794 Long term (current) use of insulin: Secondary | ICD-10-CM | POA: Insufficient documentation

## 2021-03-25 DIAGNOSIS — D509 Iron deficiency anemia, unspecified: Secondary | ICD-10-CM | POA: Diagnosis not present

## 2021-03-25 DIAGNOSIS — N390 Urinary tract infection, site not specified: Secondary | ICD-10-CM | POA: Insufficient documentation

## 2021-03-25 DIAGNOSIS — T25221A Burn of second degree of right foot, initial encounter: Secondary | ICD-10-CM | POA: Diagnosis not present

## 2021-03-25 DIAGNOSIS — E1140 Type 2 diabetes mellitus with diabetic neuropathy, unspecified: Secondary | ICD-10-CM | POA: Insufficient documentation

## 2021-03-25 DIAGNOSIS — Y929 Unspecified place or not applicable: Secondary | ICD-10-CM | POA: Insufficient documentation

## 2021-03-25 DIAGNOSIS — Z20822 Contact with and (suspected) exposure to covid-19: Secondary | ICD-10-CM | POA: Diagnosis not present

## 2021-03-25 DIAGNOSIS — Z7984 Long term (current) use of oral hypoglycemic drugs: Secondary | ICD-10-CM | POA: Diagnosis not present

## 2021-03-25 DIAGNOSIS — Y9311 Activity, swimming: Secondary | ICD-10-CM | POA: Insufficient documentation

## 2021-03-25 DIAGNOSIS — K529 Noninfective gastroenteritis and colitis, unspecified: Secondary | ICD-10-CM | POA: Insufficient documentation

## 2021-03-25 DIAGNOSIS — T25222A Burn of second degree of left foot, initial encounter: Secondary | ICD-10-CM | POA: Insufficient documentation

## 2021-03-25 NOTE — ED Triage Notes (Signed)
Pt to ED from home with c/o NVD and fatigue onset of yesterday. 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

## 2021-03-26 LAB — CBC WITH DIFFERENTIAL/PLATELET
Abs Immature Granulocytes: 0.11 10*3/uL — ABNORMAL HIGH (ref 0.00–0.07)
Basophils Absolute: 0 10*3/uL (ref 0.0–0.1)
Basophils Relative: 0 %
Eosinophils Absolute: 0.2 10*3/uL (ref 0.0–0.5)
Eosinophils Relative: 2 %
HCT: 22.9 % — ABNORMAL LOW (ref 36.0–46.0)
Hemoglobin: 8 g/dL — ABNORMAL LOW (ref 12.0–15.0)
Immature Granulocytes: 1 %
Lymphocytes Relative: 3 %
Lymphs Abs: 0.4 10*3/uL — ABNORMAL LOW (ref 0.7–4.0)
MCH: 26.5 pg (ref 26.0–34.0)
MCHC: 34.9 g/dL (ref 30.0–36.0)
MCV: 75.8 fL — ABNORMAL LOW (ref 80.0–100.0)
Monocytes Absolute: 1.1 10*3/uL — ABNORMAL HIGH (ref 0.1–1.0)
Monocytes Relative: 9 %
Neutro Abs: 10.6 10*3/uL — ABNORMAL HIGH (ref 1.7–7.7)
Neutrophils Relative %: 85 %
Platelets: 175 10*3/uL (ref 150–400)
RBC: 3.02 MIL/uL — ABNORMAL LOW (ref 3.87–5.11)
RDW: 14.9 % (ref 11.5–15.5)
WBC: 12.4 10*3/uL — ABNORMAL HIGH (ref 4.0–10.5)
nRBC: 0 % (ref 0.0–0.2)

## 2021-03-26 LAB — BASIC METABOLIC PANEL
Anion gap: 11 (ref 5–15)
Anion gap: 9 (ref 5–15)
BUN: 64 mg/dL — ABNORMAL HIGH (ref 6–20)
BUN: 61 mg/dL — ABNORMAL HIGH (ref 6–20)
CO2: 17 mmol/L — ABNORMAL LOW (ref 22–32)
CO2: 19 mmol/L — ABNORMAL LOW (ref 22–32)
Calcium: 8.2 mg/dL — ABNORMAL LOW (ref 8.9–10.3)
Calcium: 7.9 mg/dL — ABNORMAL LOW (ref 8.9–10.3)
Chloride: 101 mmol/L (ref 98–111)
Chloride: 102 mmol/L (ref 98–111)
Creatinine, Ser: 2.09 mg/dL — ABNORMAL HIGH (ref 0.44–1.00)
Creatinine, Ser: 1.75 mg/dL — ABNORMAL HIGH (ref 0.44–1.00)
GFR, Estimated: 30 mL/min — ABNORMAL LOW (ref >60)
GFR, Estimated: 38 mL/min — ABNORMAL LOW (ref >60)
Glucose, Bld: 222 mg/dL — ABNORMAL HIGH (ref 70–99)
Glucose, Bld: 211 mg/dL — ABNORMAL HIGH (ref 70–99)
Potassium: 3.4 mmol/L — ABNORMAL LOW (ref 3.5–5.1)
Potassium: 3.6 mmol/L (ref 3.5–5.1)
Sodium: 129 mmol/L — ABNORMAL LOW (ref 135–145)
Sodium: 130 mmol/L — ABNORMAL LOW (ref 135–145)

## 2021-03-26 LAB — URINALYSIS, ROUTINE W REFLEX MICROSCOPIC
Bilirubin Urine: NEGATIVE
Glucose, UA: NEGATIVE mg/dL
Ketones, ur: NEGATIVE mg/dL
Nitrite: NEGATIVE
Protein, ur: 100 mg/dL — AB
Specific Gravity, Urine: 1.01 (ref 1.005–1.030)
WBC, UA: >50 WBC/hpf — ABNORMAL HIGH (ref 0–5)
pH: 6 (ref 5.0–8.0)

## 2021-03-26 LAB — HCG, QUANTITATIVE, PREGNANCY: hCG, Beta Chain, Quant, S: <1 m[IU]/mL (ref <5)

## 2021-03-26 LAB — I-STAT BETA HCG BLOOD, ED (MC, WL, AP ONLY): I-stat hCG, quantitative: 33.8 m[IU]/mL — ABNORMAL HIGH (ref <5)

## 2021-03-26 LAB — RESP PANEL BY RT-PCR (FLU A&B, COVID) ARPGX2
Influenza A by PCR: NEGATIVE
Influenza B by PCR: NEGATIVE
SARS Coronavirus 2 by RT PCR: NEGATIVE

## 2021-03-26 MED ORDER — CEPHALEXIN 500 MG PO CAPS: 500.0000 mg | ORAL_CAPSULE | Freq: Two times a day (BID) | ORAL | 0 refills | Status: DC

## 2021-03-26 MED ORDER — IRON 325 (65 FE) MG PO TABS: ORAL_TABLET | ORAL | 0 refills | Status: DC

## 2021-03-26 MED ORDER — BACITRACIN ZINC 500 UNIT/GM EX OINT
TOPICAL_OINTMENT | CUTANEOUS | Status: AC
  Administered 2021-03-26: 1
  Filled 2021-03-26: qty 0.9

## 2021-03-26 MED ORDER — LACTATED RINGERS IV BOLUS
1000.0000 mL | Freq: Once | INTRAVENOUS | Status: AC
  Administered 2021-03-26: 1000 mL via INTRAVENOUS

## 2021-03-26 MED ORDER — SODIUM CHLORIDE 0.9 % IV SOLN
1.0000 g | Freq: Once | INTRAVENOUS | Status: AC
  Administered 2021-03-26: 1 g via INTRAVENOUS
  Filled 2021-03-26: qty 10

## 2021-03-26 MED ORDER — ONDANSETRON HCL 8 MG PO TABS: 8.0000 mg | ORAL_TABLET | Freq: Three times a day (TID) | ORAL | 0 refills | Status: DC | PRN
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
## 2021-03-27 ENCOUNTER — Emergency Department (HOSPITAL_COMMUNITY)
Admission: EM | Admit: 2021-03-27 | Discharge: 2021-03-27 | Disposition: A | Discharge status: Home / Self Care | Payer: BC Managed Care – PPO | Attending: Emergency Medicine | Admitting: Emergency Medicine

## 2021-03-27 ENCOUNTER — Encounter (HOSPITAL_COMMUNITY): Payer: Self-pay

## 2021-03-27 ENCOUNTER — Emergency Department (HOSPITAL_COMMUNITY): Payer: BC Managed Care – PPO

## 2021-03-27 DIAGNOSIS — Z7984 Long term (current) use of oral hypoglycemic drugs: Secondary | ICD-10-CM | POA: Insufficient documentation

## 2021-03-27 DIAGNOSIS — Z794 Long term (current) use of insulin: Secondary | ICD-10-CM | POA: Diagnosis not present

## 2021-03-27 DIAGNOSIS — E1140 Type 2 diabetes mellitus with diabetic neuropathy, unspecified: Secondary | ICD-10-CM | POA: Diagnosis not present

## 2021-03-27 DIAGNOSIS — R0602 Shortness of breath: Secondary | ICD-10-CM | POA: Diagnosis present

## 2021-03-27 DIAGNOSIS — Z79899 Other long term (current) drug therapy: Secondary | ICD-10-CM | POA: Diagnosis not present

## 2021-03-27 DIAGNOSIS — D649 Anemia, unspecified: Secondary | ICD-10-CM

## 2021-03-27 LAB — BRAIN NATRIURETIC PEPTIDE: B Natriuretic Peptide: 123.6 pg/mL — ABNORMAL HIGH (ref 0.0–100.0)

## 2021-03-27 LAB — CBC WITH DIFFERENTIAL/PLATELET
Abs Immature Granulocytes: 0.07 10*3/uL (ref 0.00–0.07)
Basophils Absolute: 0 10*3/uL (ref 0.0–0.1)
Basophils Relative: 0 %
Eosinophils Absolute: 0.4 10*3/uL (ref 0.0–0.5)
Eosinophils Relative: 4 %
HCT: 21.5 % — ABNORMAL LOW (ref 36.0–46.0)
Hemoglobin: 7.5 g/dL — ABNORMAL LOW (ref 12.0–15.0)
Immature Granulocytes: 1 %
Lymphocytes Relative: 6 %
Lymphs Abs: 0.6 10*3/uL — ABNORMAL LOW (ref 0.7–4.0)
MCH: 26.4 pg (ref 26.0–34.0)
MCHC: 34.9 g/dL (ref 30.0–36.0)
MCV: 75.7 fL — ABNORMAL LOW (ref 80.0–100.0)
Monocytes Absolute: 1.2 10*3/uL — ABNORMAL HIGH (ref 0.1–1.0)
Monocytes Relative: 11 %
Neutro Abs: 7.9 10*3/uL — ABNORMAL HIGH (ref 1.7–7.7)
Neutrophils Relative %: 78 %
Platelets: 185 10*3/uL (ref 150–400)
RBC: 2.84 MIL/uL — ABNORMAL LOW (ref 3.87–5.11)
RDW: 14.7 % (ref 11.5–15.5)
WBC: 10.2 10*3/uL (ref 4.0–10.5)
nRBC: 0 % (ref 0.0–0.2)

## 2021-03-27 LAB — COMPREHENSIVE METABOLIC PANEL
ALT: 14 U/L (ref 0–44)
AST: 11 U/L — ABNORMAL LOW (ref 15–41)
Albumin: 2.3 g/dL — ABNORMAL LOW (ref 3.5–5.0)
Alkaline Phosphatase: 71 U/L (ref 38–126)
Anion gap: 10 (ref 5–15)
BUN: 40 mg/dL — ABNORMAL HIGH (ref 6–20)
CO2: 19 mmol/L — ABNORMAL LOW (ref 22–32)
Calcium: 8.1 mg/dL — ABNORMAL LOW (ref 8.9–10.3)
Chloride: 102 mmol/L (ref 98–111)
Creatinine, Ser: 1.28 mg/dL — ABNORMAL HIGH (ref 0.44–1.00)
GFR, Estimated: 55 mL/min — ABNORMAL LOW (ref >60)
Glucose, Bld: 276 mg/dL — ABNORMAL HIGH (ref 70–99)
Potassium: 3.1 mmol/L — ABNORMAL LOW (ref 3.5–5.1)
Sodium: 131 mmol/L — ABNORMAL LOW (ref 135–145)
Total Bilirubin: 0.9 mg/dL (ref 0.3–1.2)
Total Protein: 6.4 g/dL — ABNORMAL LOW (ref 6.5–8.1)

## 2021-03-27 LAB — TROPONIN I (HIGH SENSITIVITY): Troponin I (High Sensitivity): 5 ng/L (ref <18)

## 2021-03-27 MED ORDER — POTASSIUM CHLORIDE CRYS ER 20 MEQ PO TBCR
40.0000 meq | EXTENDED_RELEASE_TABLET | Freq: Once | ORAL | Status: AC
  Administered 2021-03-27: 40 meq via ORAL
  Filled 2021-03-27: qty 2

## 2021-03-27 NOTE — ED Notes (Signed)
Pt to X-Ray via stretcher 

## 2021-03-27 NOTE — Discharge Instructions (Addendum)
Your shortness of breath is likely from getting IV fluids yesterday.  Your kidney function has improved.  You were prescribed iron pills yesterday so please take it  You need a repeat CBC and chemistry in a week with your doctor  Your doctor for follow up   Return to ER if you have worse shortness of breath, leg swelling, abdominal pain, vomiting, dehydration.

## 2021-03-27 NOTE — ED Provider Notes (Signed)
[facility]-EMERGENCY DEPT Provider Note   CSN: [id number] Arrival date & time: 03/27/21  0450     History Chief Complaint  Patient presents with   Shortness of Breath    [name] is a 40 y.o. female hx of DM, depression, here with shortness of breath.  Patient was seen yesterday after possible COVID exposure and gastroenteritis symptoms.  She was noted to have a creatinine of 2 and was given 2 L LR bolus and also had a negative COVID test.  Patient states that since the ED visit, she noticed worsening shortness of breath.  She states that her legs are minimally swollen as well.  Denies any history of CAD or stents in the past.  Denies any history of heart failure  The history is provided by the patient.      Past Medical History:  Diagnosis Date   Anemia    Carpal tunnel syndrome    Depression    Diabetes mellitus without complication (HCC)    Diabetic neuropathy (HCC)    Hemoglobin C trait (HCC)    High cholesterol     There are no problems to display for this patient.   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     Family History  Problem Relation Age of Onset   Diabetes Mother    Hypertension Mother    Hyperlipidemia Mother    Cancer Father     Social History   Tobacco Use   Smoking status: Never   Smokeless tobacco: Never  Vaping Use   Vaping Use: Never used  Substance Use Topics   Alcohol use: No   Drug use: No    Home Medications Prior to Admission medications   Medication Sig Start Date End Date Taking? Authorizing Provider  atorvastatin (LIPITOR) 40 MG tablet Take 40 mg by mouth daily.    [provider]  baclofen (LIORESAL) 20 MG tablet Take 20 mg by mouth at bedtime.    [provider]  celecoxib (CELEBREX) 200 MG  capsule Take 200 mg by mouth daily as needed for mild pain.  07/18/20   [provider]  cephALEXin (KEFLEX) 500 MG capsule Take 1 capsule (500 mg total) by mouth 2 (two) times daily. 03/26/21   Molpus, John, MD  empagliflozin (JARDIANCE) 25 MG TABS tablet Take 25 mg by mouth daily.    [provider]  famotidine (PEPCID) 20 MG tablet Take 20 mg by mouth 2 (two) times daily as needed for heartburn or indigestion.    [provider]  Ferrous Sulfate (IRON) 325 (65 Fe) MG TABS Take 2 tablets every other day. 03/26/21   Molpus, John, MD  furosemide (LASIX) 40 MG tablet Take 40 mg by mouth daily.    [provider]  insulin degludec (TRESIBA) 100 UNIT/ML FlexTouch Pen Inject 15 Units into the skin daily.    [provider]  lipase/protease/amylase (CREON) 36000 UNITS CPEP capsule Take 72,000-144,000 Units by mouth as directed. Take 4 capsules (144,000 units) with meals & Take 2 capsules (72,000 units) with snacks 10/29/20   [provider]  ondansetron (ZOFRAN) 8 MG tablet Take 1 tablet (8 mg total) by mouth every 8 (eight) hours as needed  for nausea or vomiting. 03/26/21   Molpus, John, MD  polyethylene glycol (MIRALAX / GLYCOLAX) 17 g packet Take 17 g by mouth daily.    [provider]  pregabalin (LYRICA) 150 MG capsule Take 150 mg by mouth 2 (two) times daily. 05/07/20   [provider]  sertraline (ZOLOFT) 50 MG tablet Take 50 mg by mouth daily.    [provider]  traMADol (ULTRAM) 50 MG tablet Take 1-2 tablets by mouth daily as needed. 12/17/20 06/15/21  [provider]  TRULICITY 0.75 MG/0.5ML SOPN Inject 0.75 mg into the skin every Tuesday. 06/22/18   [provider]    Allergies    Shellfish allergy, Magnesium-containing compounds, and Tramadol  Review of Systems   Review of Systems  Respiratory:  Positive for shortness of breath.   All other systems reviewed and are negative.  Physical Exam Updated  Vital Signs BP 129/70   Pulse 85   Temp 98 F (36.7 C) (Oral)   Resp 18   LMP 03/26/2021   SpO2 96%   Physical Exam Vitals and nursing note reviewed.  Constitutional:      Comments: Tachypneic   HENT:     Head: Normocephalic.     Mouth/Throat:     Mouth: Mucous membranes are moist.  Eyes:     Extraocular Movements: Extraocular movements intact.     Pupils: Pupils are equal, round, and reactive to light.  Cardiovascular:     Rate and Rhythm: Normal rate and regular rhythm.  Pulmonary:     Comments: Crackles bilateral bases  Abdominal:     General: Bowel sounds are normal.     Palpations: Abdomen is soft.  Musculoskeletal:        General: Normal range of motion.     Cervical back: Normal range of motion and neck supple.     Comments: Trace edema bilaterally   Skin:    Capillary Refill: Capillary refill takes less than 2 seconds.  Neurological:     General: No focal deficit present.     Mental Status: She is oriented to person, place, and time.  Psychiatric:        Mood and Affect: Mood normal.        Behavior: Behavior normal.    ED Results / Procedures / Treatments   Labs (all labs ordered are listed, but only abnormal results are displayed) Labs Reviewed  CBC WITH DIFFERENTIAL/PLATELET - Abnormal; Notable for the following components:      Result Value   RBC 2.84 (*)    Hemoglobin 7.5 (*)    HCT 21.5 (*)    MCV 75.7 (*)    Neutro Abs 7.9 (*)    Lymphs Abs 0.6 (*)    Monocytes Absolute 1.2 (*)    All other components within normal limits  COMPREHENSIVE METABOLIC PANEL - Abnormal; Notable for the following components:   Sodium 131 (*)    Potassium 3.1 (*)    CO2 19 (*)    Glucose, Bld 276 (*)    BUN 40 (*)    Creatinine, Ser 1.28 (*)    Calcium 8.1 (*)    Total Protein 6.4 (*)    Albumin 2.3 (*)    AST 11 (*)    GFR, Estimated 55 (*)    All other components within normal limits  BRAIN NATRIURETIC PEPTIDE - Abnormal; Notable for the following  components:   B Natriuretic Peptide 123.6 (*)    All other components within normal  limits  TROPONIN I (HIGH SENSITIVITY)  TROPONIN I (HIGH SENSITIVITY)    EKG EKG Interpretation  Date/Time:  Thursday March 27 2021 05:05:27 EDT Ventricular Rate:  88 PR Interval:  178 QRS Duration: 97 QT Interval:  358 QTC Calculation: 434 R Axis:   24 Text Interpretation: Sinus rhythm Probable left atrial enlargement Probable anterior infarct, old No significant change since last tracing Confirmed by Richardean Canal [phone]) on 03/27/2021 5:10:38 AM  Radiology DG Chest 2 View  Result Date: 03/27/2021 CLINICAL DATA:  Shortness of breath for 24 hours EXAM: CHEST - 2 VIEW COMPARISON:  08/01/2020 FINDINGS: Normal heart size and mediastinal contours. Low volume chest. No acute infiltrate or edema. No effusion or pneumothorax. No acute osseous findings. IMPRESSION: No active cardiopulmonary disease. Electronically Signed   By: Marnee Spring M.D.   On: 03/27/2021 05:33    Procedures Procedures   Medications Ordered in ED Medications  potassium chloride SA (KLOR-CON) CR tablet 40 mEq (has no administration in time range)    ED Course  I have reviewed the triage vital signs and the nursing notes.  Pertinent labs & imaging results that were available during my care of the patient were reviewed by me and considered in my medical decision making (see chart for details).    MDM Rules/Calculators/A&P                         Tobi Groesbeck is a 41 y.o. female here presenting with shortness of breath.  She received IV fluids yesterday I wonder if she went into pulmonary edema.  Will get CBC and CMP and BNP and chest x-ray.     6:50 AM Patient's creatinine improved to 1.28 from 2.  Her potassium went down to 3.1 and was supplemented.  Patient's chest x-ray did not show any obvious pulmonary edema and BNP is 128.  Patient's hemoglobin did drop down to 7.5 from 8 likely from given IV fluids.  I think her  shortness of breath is likely from getting IV fluids yesterday.  Since her BNP is 128 and there is no obvious pulmonary on chest x-ray and she has no oxygen requirement, I think she is stable for discharge.  I ordered iron pills and recommend that she have a repeat CBC in a week  Final Clinical Impression(s) / ED Diagnoses Final diagnoses:  None    Rx / DC Orders ED Discharge Orders     None        Charlynne Pander, MD 03/27/21 [phone]

## 2021-03-27 NOTE — ED Triage Notes (Signed)
Pt arrived via POV, c/o SOB worsening with exertion and lying flat. States intermittent pain with deep breathing as well. Denies any known sick contacts.

## 2021-03-28 LAB — URINE CULTURE: Culture: >=100000 — AB

## 2021-03-29 NOTE — Progress Notes (Signed)
ED Antimicrobial Stewardship Positive Culture Follow Up   [name] is an 40 y.o. female who presented to [facility] on 03/27/2021 with a chief complaint of  Chief Complaint  Patient presents with   Shortness of Breath    Recent Results (from the past 720 hour(s))  Resp Panel by RT-PCR (Flu A&B, Covid) Nasopharyngeal Swab     Status: None   Collection Time: 03/26/21  1:17 AM   Specimen: Nasopharyngeal Swab; Nasopharyngeal(NP) swabs in vial transport medium  Result Value Ref Range Status   SARS Coronavirus 2 by RT PCR NEGATIVE NEGATIVE Final    Comment: (NOTE) SARS-CoV-2 target nucleic acids are NOT DETECTED.  The SARS-CoV-2 RNA is generally detectable in upper respiratory specimens during the acute phase of infection. The lowest concentration of SARS-CoV-2 viral copies this assay can detect is 138 copies/mL. A negative result does not preclude SARS-Cov-2 infection and should not be used as the sole basis for treatment or other patient management decisions. A negative result may occur with  improper specimen collection/handling, submission of specimen other than nasopharyngeal swab, presence of viral mutation(s) within the areas targeted by this assay, and inadequate number of viral copies(<138 copies/mL). A negative result must be combined with clinical observations, patient history, and epidemiological information. The expected result is Negative.  Fact Sheet for Patients:  BloggerCourse.com  Fact Sheet for Healthcare Providers:  SeriousBroker.it  This test is no t yet approved or cleared by the [place] FDA and  has been authorized for detection and/or diagnosis of SARS-CoV-2 by FDA under an Emergency Use Authorization (EUA). This EUA will remain  in effect (meaning this test can be used) for the duration of the COVID-19 declaration under Section 564(b)(1) of the Act, 21 U.S.C.section 360bbb-3(b)(1), unless the  authorization is terminated  or revoked sooner.       Influenza A by PCR NEGATIVE NEGATIVE Final   Influenza B by PCR NEGATIVE NEGATIVE Final    Comment: (NOTE) The Xpert Xpress SARS-CoV-2/FLU/RSV plus assay is intended as an aid in the diagnosis of influenza from Nasopharyngeal swab specimens and should not be used as a sole basis for treatment. Nasal washings and aspirates are unacceptable for Xpert Xpress SARS-CoV-2/FLU/RSV testing.  Fact Sheet for Patients: BloggerCourse.com  Fact Sheet for Healthcare Providers: SeriousBroker.it  This test is not yet approved or cleared by the [place] FDA and has been authorized for detection and/or diagnosis of SARS-CoV-2 by FDA under an Emergency Use Authorization (EUA). This EUA will remain in effect (meaning this test can be used) for the duration of the COVID-19 declaration under Section 564(b)(1) of the Act, 21 U.S.C. section 360bbb-3(b)(1), unless the authorization is terminated or revoked.  Performed at [facility], [address] [place], [place] [address]   Urine culture     Status: Abnormal   Collection Time: 03/26/21  4:35 AM   Specimen: Urine, Random  Result Value Ref Range Status   Specimen Description   Final    URINE, RANDOM Performed at [facility], [address], [place], [place] [address]    Special Requests   Final    NONE Performed at [facility], [address] [place], [place], [place] [address]    Culture (A)  Final    >=100,000 COLONIES/mL ESCHERICHIA COLI Confirmed Extended Spectrum Beta-Lactamase Producer (ESBL).  In bloodstream infections from ESBL organisms, carbapenems are preferred over piperacillin/tazobactam. They are shown to have a lower risk of mortality.    Report  Status 03/28/2021 FINAL  Final   Organism ID, Bacteria ESCHERICHIA COLI (A)  Final      Susceptibility   Escherichia coli -  MIC*    AMPICILLIN >=32 RESISTANT Resistant     CEFAZOLIN >=64 RESISTANT Resistant     CEFEPIME 16 RESISTANT Resistant     CEFTRIAXONE >=64 RESISTANT Resistant     CIPROFLOXACIN 0.5 SENSITIVE Sensitive     GENTAMICIN >=16 RESISTANT Resistant     IMIPENEM <=0.25 SENSITIVE Sensitive     NITROFURANTOIN <=16 SENSITIVE Sensitive     TRIMETH/SULFA >=320 RESISTANT Resistant     AMPICILLIN/SULBACTAM >=32 RESISTANT Resistant     PIP/TAZO <=4 SENSITIVE Sensitive     * >=100,000 COLONIES/mL ESCHERICHIA COLI    [x]  Treated with Keflex, organism resistant to prescribed antimicrobial []  Patient discharged originally without antimicrobial agent and treatment is now indicated  New antibiotic prescription: Macrobid 100mg  PO BID for 5 days STOP Keflex.  ED Provider: Dr PharmD, BCPS Clinical Pharmacist [facility] main pharmacy [phone] 03/29/2021 11:48 AM

## 2021-03-30 ENCOUNTER — Telehealth: Payer: Self-pay | Admitting: Emergency Medicine

## 2021-03-30 NOTE — Telephone Encounter (Signed)
Post ED Visit - Positive Culture Follow-up: Successful Patient Follow-Up  Culture assessed and recommendations reviewed by:  []  , Pharm.D. []  Enzo Bi, Pharm.D., BCPS AQ-ID []  , Pharm.D., BCPS []  Celedonio Miyamoto, Pharm.D., BCPS []  [place], Garvin Fila.D., BCPS, AAHIVP []  , Pharm.D., BCPS, AAHIVP []  Georgina Pillion, PharmD, BCPS []  , PharmD, BCPS []  [place], PharmD, BCPS [x]  [address], PharmD  Positive urine culture  []  Patient discharged without antimicrobial prescription and treatment is now indicated [x]  Organism is resistant to prescribed ED discharge antimicrobial []  Patient with positive blood cultures  Changes discussed with ED provider: March Pfeiffer, MD New antibiotic prescription Macrobid 100 mg BID for five days Called to [facility] [phone])  Contacted patient, date 03/30/2021, time 1520   Shivani Barrantes C Chardae Mulkern 03/30/2021, 3:37 PM

## 2021-03-31 ENCOUNTER — Emergency Department (HOSPITAL_COMMUNITY): Payer: BC Managed Care – PPO

## 2021-03-31 ENCOUNTER — Other Ambulatory Visit: Payer: Self-pay

## 2021-03-31 ENCOUNTER — Encounter (HOSPITAL_COMMUNITY): Payer: Self-pay

## 2021-03-31 ENCOUNTER — Observation Stay (HOSPITAL_COMMUNITY)
Admission: EM | Admit: 2021-03-31 | Discharge: 2021-04-02 | Disposition: A | Discharge status: Home / Self Care | Payer: BC Managed Care – PPO | Attending: Family Medicine | Admitting: Family Medicine

## 2021-03-31 DIAGNOSIS — I5030 Unspecified diastolic (congestive) heart failure: Secondary | ICD-10-CM | POA: Insufficient documentation

## 2021-03-31 DIAGNOSIS — Z79899 Other long term (current) drug therapy: Secondary | ICD-10-CM | POA: Diagnosis not present

## 2021-03-31 DIAGNOSIS — I110 Hypertensive heart disease with heart failure: Secondary | ICD-10-CM | POA: Insufficient documentation

## 2021-03-31 DIAGNOSIS — Z20822 Contact with and (suspected) exposure to covid-19: Secondary | ICD-10-CM | POA: Diagnosis not present

## 2021-03-31 DIAGNOSIS — Z794 Long term (current) use of insulin: Secondary | ICD-10-CM | POA: Diagnosis not present

## 2021-03-31 DIAGNOSIS — D649 Anemia, unspecified: Principal | ICD-10-CM | POA: Insufficient documentation

## 2021-03-31 DIAGNOSIS — E119 Type 2 diabetes mellitus without complications: Secondary | ICD-10-CM | POA: Diagnosis not present

## 2021-03-31 DIAGNOSIS — N920 Excessive and frequent menstruation with regular cycle: Secondary | ICD-10-CM

## 2021-03-31 DIAGNOSIS — R0602 Shortness of breath: Secondary | ICD-10-CM | POA: Diagnosis present

## 2021-03-31 LAB — URINALYSIS, ROUTINE W REFLEX MICROSCOPIC
Bilirubin Urine: NEGATIVE
Glucose, UA: 50 mg/dL — AB
Ketones, ur: NEGATIVE mg/dL
Leukocytes,Ua: NEGATIVE
Nitrite: NEGATIVE
Protein, ur: >=300 mg/dL — AB
Specific Gravity, Urine: 1.012 (ref 1.005–1.030)
pH: 6 (ref 5.0–8.0)

## 2021-03-31 LAB — CBC WITH DIFFERENTIAL/PLATELET
Abs Immature Granulocytes: 0.19 10*3/uL — ABNORMAL HIGH (ref 0.00–0.07)
Basophils Absolute: 0 10*3/uL (ref 0.0–0.1)
Basophils Relative: 0 %
Eosinophils Absolute: 0.3 10*3/uL (ref 0.0–0.5)
Eosinophils Relative: 2 %
HCT: 23.6 % — ABNORMAL LOW (ref 36.0–46.0)
Hemoglobin: 8 g/dL — ABNORMAL LOW (ref 12.0–15.0)
Immature Granulocytes: 2 %
Lymphocytes Relative: 14 %
Lymphs Abs: 1.6 10*3/uL (ref 0.7–4.0)
MCH: 26.2 pg (ref 26.0–34.0)
MCHC: 33.9 g/dL (ref 30.0–36.0)
MCV: 77.4 fL — ABNORMAL LOW (ref 80.0–100.0)
Monocytes Absolute: 0.8 10*3/uL (ref 0.1–1.0)
Monocytes Relative: 8 %
Neutro Abs: 8.1 10*3/uL — ABNORMAL HIGH (ref 1.7–7.7)
Neutrophils Relative %: 74 %
Platelets: 353 10*3/uL (ref 150–400)
RBC: 3.05 MIL/uL — ABNORMAL LOW (ref 3.87–5.11)
RDW: 15.4 % (ref 11.5–15.5)
WBC: 11 10*3/uL — ABNORMAL HIGH (ref 4.0–10.5)
nRBC: 0 % (ref 0.0–0.2)

## 2021-03-31 LAB — COMPREHENSIVE METABOLIC PANEL
ALT: 11 U/L (ref 0–44)
AST: 10 U/L — ABNORMAL LOW (ref 15–41)
Albumin: 2.6 g/dL — ABNORMAL LOW (ref 3.5–5.0)
Alkaline Phosphatase: 72 U/L (ref 38–126)
Anion gap: 9 (ref 5–15)
BUN: 23 mg/dL — ABNORMAL HIGH (ref 6–20)
CO2: 25 mmol/L (ref 22–32)
Calcium: 8.5 mg/dL — ABNORMAL LOW (ref 8.9–10.3)
Chloride: 103 mmol/L (ref 98–111)
Creatinine, Ser: 1.45 mg/dL — ABNORMAL HIGH (ref 0.44–1.00)
GFR, Estimated: 47 mL/min — ABNORMAL LOW (ref >60)
Glucose, Bld: 143 mg/dL — ABNORMAL HIGH (ref 70–99)
Potassium: 2.9 mmol/L — ABNORMAL LOW (ref 3.5–5.1)
Sodium: 137 mmol/L (ref 135–145)
Total Bilirubin: 0.5 mg/dL (ref 0.3–1.2)
Total Protein: 7.3 g/dL (ref 6.5–8.1)

## 2021-03-31 LAB — I-STAT CHEM 8, ED
BUN: 19 mg/dL (ref 6–20)
Calcium, Ion: 1.16 mmol/L (ref 1.15–1.40)
Chloride: 101 mmol/L (ref 98–111)
Creatinine, Ser: 1.4 mg/dL — ABNORMAL HIGH (ref 0.44–1.00)
Glucose, Bld: 141 mg/dL — ABNORMAL HIGH (ref 70–99)
HCT: 23 % — ABNORMAL LOW (ref 36.0–46.0)
Hemoglobin: 7.8 g/dL — ABNORMAL LOW (ref 12.0–15.0)
Potassium: 2.9 mmol/L — ABNORMAL LOW (ref 3.5–5.1)
Sodium: 136 mmol/L (ref 135–145)
TCO2: 22 mmol/L (ref 22–32)

## 2021-03-31 NOTE — ED Triage Notes (Signed)
Pt reports this is her third time here in a week.  Pt states she has ongoing shortness of breath, chest and back for over a week.  Pt has had recent negative covid tests.  Pt has hx of dm and anemia.  Pt reports she has been falling asleep frequently, states she goes to work and sleeps at work because she is so fatigued, weak and has poor appetite.

## 2021-03-31 NOTE — ED Provider Notes (Signed)
Emergency Medicine Provider Triage Evaluation Note  Janessa Mickle , a 40 y.o. female  was evaluated in triage.  Pt complains of patient presents with shortness of breath and fatigue, seen here on the fifth and seventh for same presentation, lab or imaging overall unremarkable.  Symptoms are unchanged from recent discharge..  Review of Systems  Positive: Shortness of breath, fatigue Negative: Chest pain, pedal edema  Physical Exam  BP 125/79 (BP Location: Right Arm)   Pulse 97   Temp 98.3 F (36.8 C) (Oral)   Resp (!) 22   Ht 5\' 3"  (1.6 m)   Wt 97.5 kg   LMP 03/26/2021   SpO2 100%   BMI 38.09 kg/m  Gen:   Awake, no distress   Resp:  Normal effort  MSK:   Moves extremities without difficulty  Other:    Medical Decision Making  Medically screening exam initiated at 9:20 PM.  Appropriate orders placed.  Ivania Teagarden was informed that the remainder of the evaluation will be completed by another provider, this initial triage assessment does not replace that evaluation, and the importance of remaining in the ED until their evaluation is complete.  Patient presents with shortness of breath and fatigue, laboratory imaging has been obtained, patient need further work-up here in the emergency department.   Verlin Grills, PA-C 03/31/21 2121    2122, MD 04/01/21 [phone]

## 2021-04-01 ENCOUNTER — Emergency Department (HOSPITAL_COMMUNITY): Payer: BC Managed Care – PPO

## 2021-04-01 ENCOUNTER — Encounter (HOSPITAL_COMMUNITY): Payer: Self-pay

## 2021-04-01 ENCOUNTER — Observation Stay (HOSPITAL_COMMUNITY): Payer: BC Managed Care – PPO

## 2021-04-01 ENCOUNTER — Ambulatory Visit: Payer: BC Managed Care – PPO | Admitting: Podiatry

## 2021-04-01 DIAGNOSIS — D649 Anemia, unspecified: Secondary | ICD-10-CM

## 2021-04-01 LAB — COMPREHENSIVE METABOLIC PANEL
ALT: 11 U/L (ref 0–44)
AST: 9 U/L — ABNORMAL LOW (ref 15–41)
Albumin: 2.5 g/dL — ABNORMAL LOW (ref 3.5–5.0)
Alkaline Phosphatase: 72 U/L (ref 38–126)
Anion gap: 10 (ref 5–15)
BUN: 20 mg/dL (ref 6–20)
CO2: 22 mmol/L (ref 22–32)
Calcium: 8.6 mg/dL — ABNORMAL LOW (ref 8.9–10.3)
Chloride: 106 mmol/L (ref 98–111)
Creatinine, Ser: 1.09 mg/dL — ABNORMAL HIGH (ref 0.44–1.00)
GFR, Estimated: >60 mL/min (ref >60)
Glucose, Bld: 163 mg/dL — ABNORMAL HIGH (ref 70–99)
Potassium: 3.1 mmol/L — ABNORMAL LOW (ref 3.5–5.1)
Sodium: 138 mmol/L (ref 135–145)
Total Bilirubin: 0.6 mg/dL (ref 0.3–1.2)
Total Protein: 6.9 g/dL (ref 6.5–8.1)

## 2021-04-01 LAB — HEMOGLOBIN A1C
Hgb A1c MFr Bld: 7.5 % — ABNORMAL HIGH (ref 4.8–5.6)
Mean Plasma Glucose: 168.55 mg/dL

## 2021-04-01 LAB — PREPARE PLATELET PHERESIS
Unit division: 0
Unit division: 0

## 2021-04-01 LAB — BPAM PLATELET PHERESIS
Blood Product Expiration Date: 202207132359
Blood Product Expiration Date: 202207142359
Unit Type and Rh: 6200
Unit Type and Rh: 7300

## 2021-04-01 LAB — FOLATE: Folate: 18 ng/mL (ref >5.9)

## 2021-04-01 LAB — IRON AND TIBC
Iron: 26 ug/dL — ABNORMAL LOW (ref 28–170)
Saturation Ratios: 11 % (ref 10.4–31.8)
TIBC: 239 ug/dL — ABNORMAL LOW (ref 250–450)
UIBC: 213 ug/dL

## 2021-04-01 LAB — PREPARE RBC (CROSSMATCH)

## 2021-04-01 LAB — POC OCCULT BLOOD, ED: Fecal Occult Bld: NEGATIVE

## 2021-04-01 LAB — GLUCOSE, CAPILLARY
Glucose-Capillary: 156 mg/dL — ABNORMAL HIGH (ref 70–99)
Glucose-Capillary: 163 mg/dL — ABNORMAL HIGH (ref 70–99)
Glucose-Capillary: 160 mg/dL — ABNORMAL HIGH (ref 70–99)
Glucose-Capillary: 168 mg/dL — ABNORMAL HIGH (ref 70–99)

## 2021-04-01 LAB — CBC
HCT: 22.4 % — ABNORMAL LOW (ref 36.0–46.0)
Hemoglobin: 7.6 g/dL — ABNORMAL LOW (ref 12.0–15.0)
MCH: 26.3 pg (ref 26.0–34.0)
MCHC: 33.9 g/dL (ref 30.0–36.0)
MCV: 77.5 fL — ABNORMAL LOW (ref 80.0–100.0)
Platelets: 331 10*3/uL (ref 150–400)
RBC: 2.89 MIL/uL — ABNORMAL LOW (ref 3.87–5.11)
RDW: 15.7 % — ABNORMAL HIGH (ref 11.5–15.5)
WBC: 9.3 10*3/uL (ref 4.0–10.5)
nRBC: 0 % (ref 0.0–0.2)

## 2021-04-01 LAB — VITAMIN B12: Vitamin B-12: 1385 pg/mL — ABNORMAL HIGH (ref 180–914)

## 2021-04-01 LAB — APTT: aPTT: 25 seconds (ref 24–36)

## 2021-04-01 LAB — RESP PANEL BY RT-PCR (FLU A&B, COVID) ARPGX2
Influenza A by PCR: NEGATIVE
Influenza B by PCR: NEGATIVE
SARS Coronavirus 2 by RT PCR: NEGATIVE

## 2021-04-01 LAB — RETICULOCYTES
Immature Retic Fract: 26.1 % — ABNORMAL HIGH (ref 2.3–15.9)
RBC.: 2.89 MIL/uL — ABNORMAL LOW (ref 3.87–5.11)
Retic Count, Absolute: 69.6 10*3/uL (ref 19.0–186.0)
Retic Ct Pct: 2.4 % (ref 0.4–3.1)

## 2021-04-01 LAB — FERRITIN: Ferritin: 108 ng/mL (ref 11–307)

## 2021-04-01 LAB — PROTIME-INR
INR: 1.1 (ref 0.8–1.2)
Prothrombin Time: 14.3 seconds (ref 11.4–15.2)

## 2021-04-01 LAB — ABO/RH: ABO/RH(D): AB POS

## 2021-04-01 LAB — HIV ANTIBODY (ROUTINE TESTING W REFLEX): HIV Screen 4th Generation wRfx: NONREACTIVE

## 2021-04-01 MED ORDER — PANCRELIPASE (LIP-PROT-AMYL) 12000-38000 UNITS PO CPEP
72000.0000 [IU] | ORAL_CAPSULE | ORAL | Status: DC
Start: 2021-04-01 — End: 2021-04-01

## 2021-04-01 MED ORDER — SODIUM CHLORIDE (PF) 0.9 % IJ SOLN
INTRAMUSCULAR | Status: AC
  Filled 2021-04-01: qty 50

## 2021-04-01 MED ORDER — PANCRELIPASE (LIP-PROT-AMYL) 36000-114000 UNITS PO CPEP
144000.0000 [IU] | ORAL_CAPSULE | Freq: Three times a day (TID) | ORAL | Status: DC
  Administered 2021-04-01 – 2021-04-02 (×2): 144000 [IU] via ORAL
  Filled 2021-04-01 (×5): qty 4

## 2021-04-01 MED ORDER — FERROUS SULFATE 325 (65 FE) MG PO TABS
325.0000 mg | ORAL_TABLET | ORAL | Status: DC
  Administered 2021-04-01: 325 mg via ORAL
  Filled 2021-04-01: qty 1

## 2021-04-01 MED ORDER — SODIUM CHLORIDE 0.9 % IV SOLN
10.0000 mL/h | Freq: Once | INTRAVENOUS | Status: AC
  Administered 2021-04-01: 10 mL/h via INTRAVENOUS

## 2021-04-01 MED ORDER — INSULIN ASPART 100 UNIT/ML IJ SOLN
0.0000 [IU] | Freq: Three times a day (TID) | INTRAMUSCULAR | Status: DC
  Administered 2021-04-01 – 2021-04-02 (×4): 2 [IU] via SUBCUTANEOUS

## 2021-04-01 MED ORDER — FAMOTIDINE 20 MG PO TABS: 20.0000 mg | ORAL_TABLET | Freq: Two times a day (BID) | ORAL | Status: DC | PRN

## 2021-04-01 MED ORDER — POTASSIUM CHLORIDE CRYS ER 20 MEQ PO TBCR
40.0000 meq | EXTENDED_RELEASE_TABLET | Freq: Once | ORAL | Status: AC
  Administered 2021-04-01: 40 meq via ORAL
  Filled 2021-04-01: qty 2

## 2021-04-01 MED ORDER — BACLOFEN 20 MG PO TABS
20.0000 mg | ORAL_TABLET | Freq: Every day | ORAL | Status: DC
  Administered 2021-04-01: 20 mg via ORAL
  Filled 2021-04-01: qty 1

## 2021-04-01 MED ORDER — INSULIN DEGLUDEC 100 UNIT/ML ~~LOC~~ SOPN: 15.0000 [IU] | PEN_INJECTOR | Freq: Every day | SUBCUTANEOUS | Status: DC

## 2021-04-01 MED ORDER — SODIUM CHLORIDE 0.9% IV SOLUTION: Freq: Once | INTRAVENOUS | Status: AC

## 2021-04-01 MED ORDER — INSULIN GLARGINE 100 UNIT/ML ~~LOC~~ SOLN
15.0000 [IU] | Freq: Every day | SUBCUTANEOUS | Status: DC
  Administered 2021-04-01 – 2021-04-02 (×2): 15 [IU] via SUBCUTANEOUS
  Filled 2021-04-01 (×2): qty 0.15

## 2021-04-01 MED ORDER — SERTRALINE HCL 50 MG PO TABS
50.0000 mg | ORAL_TABLET | Freq: Every day | ORAL | Status: DC
  Administered 2021-04-01 – 2021-04-02 (×2): 50 mg via ORAL
  Filled 2021-04-01 (×2): qty 1

## 2021-04-01 MED ORDER — PREGABALIN 75 MG PO CAPS
150.0000 mg | ORAL_CAPSULE | Freq: Two times a day (BID) | ORAL | Status: DC
  Administered 2021-04-01 – 2021-04-02 (×3): 150 mg via ORAL
  Filled 2021-04-01 (×3): qty 2

## 2021-04-01 MED ORDER — IOHEXOL 350 MG/ML SOLN
100.0000 mL | Freq: Once | INTRAVENOUS | Status: AC | PRN
  Administered 2021-04-01: 80 mL via INTRAVENOUS

## 2021-04-01 MED ORDER — PANCRELIPASE (LIP-PROT-AMYL) 12000-38000 UNITS PO CPEP: 72000.0000 [IU] | ORAL_CAPSULE | Freq: Two times a day (BID) | ORAL | Status: DC | PRN

## 2021-04-01 MED ORDER — SILVER SULFADIAZINE 1 % EX CREA
TOPICAL_CREAM | Freq: Every day | CUTANEOUS | Status: DC
  Filled 2021-04-01: qty 50
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
## 2021-04-01 NOTE — Progress Notes (Signed)
Blood unit released by ED RN this morning after blood unit was ordered.   Blood unit was not ready until 9am. Getting ready to pick up unit from blood bank now.    SWhittemore, Charity fundraiser
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
## 2021-04-01 NOTE — Progress Notes (Addendum)
This is a very pleasant 40 year old lady who presented to ED the third time within a week with shortness of breath.  She was diagnosed with symptomatic anemia.  Hemoglobin was 7.6, down from 10.4 in March of this year.  CT angiogram of the chest ruled out any acute pathology or PE.  Has a history of menorrhagia for a long time but has regular periods.  Uses pads almost every 2 hours.  Although her periods last only for 3 days or so.  Has also noticed some blood clots.  Per her, she sees her PCP but she has never seen an OB/GYN and she has never correct this issue with her PCP either.  1 unit of PRBC was ordered but she has not received it yet.  She is still symptomatic feels weak and has exertional dyspnea.  Does not feel like she will be able to go home today even after transfusion.  Will monitor, check H&H after transfusion and in the morning.  Physical examination is fully benign.  I will order ultrasound pelvis.  Mild hypokalemia, potassium 3.1.  Will replace.  Recheck in the morning.  FOBT negative

## 2021-04-02 ENCOUNTER — Encounter (HOSPITAL_BASED_OUTPATIENT_CLINIC_OR_DEPARTMENT_OTHER): Payer: BC Managed Care – PPO | Admitting: Internal Medicine

## 2021-04-02 DIAGNOSIS — D649 Anemia, unspecified: Secondary | ICD-10-CM | POA: Diagnosis not present

## 2021-04-02 LAB — BASIC METABOLIC PANEL
Anion gap: 7 (ref 5–15)
BUN: 21 mg/dL — ABNORMAL HIGH (ref 6–20)
CO2: 23 mmol/L (ref 22–32)
Calcium: 8.3 mg/dL — ABNORMAL LOW (ref 8.9–10.3)
Chloride: 108 mmol/L (ref 98–111)
Creatinine, Ser: 1.19 mg/dL — ABNORMAL HIGH (ref 0.44–1.00)
GFR, Estimated: 60 mL/min — ABNORMAL LOW (ref >60)
Glucose, Bld: 163 mg/dL — ABNORMAL HIGH (ref 70–99)
Potassium: 3.9 mmol/L (ref 3.5–5.1)
Sodium: 138 mmol/L (ref 135–145)

## 2021-04-02 LAB — TYPE AND SCREEN
ABO/RH(D): AB POS
Antibody Screen: POSITIVE
Donor AG Type: NEGATIVE
PT AG Type: NEGATIVE
Unit division: 0

## 2021-04-02 LAB — BPAM RBC
Blood Product Expiration Date: 202208012359
ISSUE DATE / TIME: 202207121038
Unit Type and Rh: 6200

## 2021-04-02 LAB — CBC
HCT: 24.2 % — ABNORMAL LOW (ref 36.0–46.0)
Hemoglobin: 8.1 g/dL — ABNORMAL LOW (ref 12.0–15.0)
MCH: 26.6 pg (ref 26.0–34.0)
MCHC: 33.5 g/dL (ref 30.0–36.0)
MCV: 79.3 fL — ABNORMAL LOW (ref 80.0–100.0)
Platelets: 319 10*3/uL (ref 150–400)
RBC: 3.05 MIL/uL — ABNORMAL LOW (ref 3.87–5.11)
RDW: 15.9 % — ABNORMAL HIGH (ref 11.5–15.5)
WBC: 8.9 10*3/uL (ref 4.0–10.5)
nRBC: 0 % (ref 0.0–0.2)

## 2021-04-02 LAB — GLUCOSE, CAPILLARY: Glucose-Capillary: 178 mg/dL — ABNORMAL HIGH (ref 70–99)

## 2021-04-02 LAB — MAGNESIUM: Magnesium: 2.4 mg/dL (ref 1.7–2.4)

## 2021-04-02 NOTE — Discharge Summary (Signed)
Physician Discharge Summary  [name]:[id number] DOB: 12-23-1980 DOA: 03/31/2021  PCP: [name], Landis Gandy, PA-C  Admit date: 03/31/2021 Discharge date: 04/02/2021    Admitted From: Home Disposition: Home  Recommendations for Outpatient Follow-up:  Follow up with PCP in 1-2 weeks Follow-up with gynecologist in 1 to 2 weeks Please obtain BMP/CBC in one week Please follow up with your PCP on the following pending results: Unresulted Labs (From admission, onward)    None         Home Health: None Equipment/Devices: None  Discharge Condition: Stable CODE STATUS: Full code Diet recommendation: Cardiac  Subjective: Seen and examined.  She has no complaints.  Her shortness of breath has improved significantly.  Brief/Interim Summary: [name] is a 40 y.o. female with medical history significant of anemia, T2DM c/b neuropathy, hemoglobin C trait and multiple other medical problems presented with shortness of breath with exertion.   She notes GI bug last week with N/V/D.  She was seen in the ED at that time and treated with IVF and started on iron for her anemia.  She was started on abx for presumed UTI.  She came back with persistent and worsening SOB with exertion.  She notes pleuritic CP.  She notes hx of anemia, but never needed transfusion.  Her last menstrual period was last week, 7/6-7-8, changed pads about every 2 hours, did pass clots (about the same as her normal periods) and according to her, this is her usual pattern of her menstrual period but she had never mentioned this to her PCP and has never seen a gynecologist.  Upon arrival to ED this time, her hemoglobin was 7.6.  Due to pleuritic chest pain and shortness of breath, CT angiogram of the chest was done which was unremarkable and negative for PE as well.  She was admitted for blood transfusion.  Received 1 unit of PRBC transfusion.  Her hemoglobin post transfusion today is 8.1.  Patient's breathing has  improved.  Her FOBT was negative.  Iron studies indicate iron deficiency anemia.  Due to history of menorrhagia, pelvic ultrasound was done which did not show any abnormality either.  Since she is a stable and there is no further indication of transfusion, she is being discharged home.  She already was taking iron pills which I advised her to continue to take and also increase her food intake that contains more iron such as apples and spinach.  She verbalized understanding and she will also follow with PCP as well as gynecologist.  Discharge Diagnoses:  Active Problems:   Symptomatic anemia    Discharge Instructions   Allergies as of 04/02/2021       Reactions   Shellfish Allergy Itching   Magnesium-containing Compounds Hives   Tramadol Other (See Comments)   Makes patient twitch really bad        Medication List     STOP taking these medications    cephALEXin 500 MG capsule Commonly known as: KEFLEX       TAKE these medications    atorvastatin 40 MG tablet Commonly known as: LIPITOR Take 40 mg by mouth daily.   baclofen 20 MG tablet Commonly known as: LIORESAL Take 20 mg by mouth at bedtime.   celecoxib 200 MG capsule Commonly known as: CELEBREX Take 200 mg by mouth daily as needed for mild pain.   empagliflozin 25 MG Tabs tablet Commonly known as: JARDIANCE Take 25 mg by mouth daily.   famotidine 20 MG tablet Commonly known  as: PEPCID Take 20 mg by mouth 2 (two) times daily as needed for heartburn or indigestion.   furosemide 40 MG tablet Commonly known as: LASIX Take 40 mg by mouth daily.   insulin degludec 100 UNIT/ML FlexTouch Pen Commonly known as: TRESIBA Inject 15 Units into the skin daily.   Iron 325 (65 Fe) MG Tabs Take 2 tablets every other day.   lipase/protease/amylase 59163 UNITS Cpep capsule Commonly known as: CREON Take 72,000-144,000 Units by mouth See admin instructions. Take 4 capsules (144,000 units) with meals & Take 2 capsules  (72,000 units) with snacks   nitrofurantoin (macrocrystal-monohydrate) 100 MG capsule Commonly known as: MACROBID Take 100 mg by mouth 2 (two) times daily.   ondansetron 8 MG tablet Commonly known as: Zofran Take 1 tablet (8 mg total) by mouth every 8 (eight) hours as needed for nausea or vomiting.   polyethylene glycol 17 g packet Commonly known as: MIRALAX / GLYCOLAX Take 17 g by mouth daily as needed for mild constipation.   pregabalin 150 MG capsule Commonly known as: LYRICA Take 150 mg by mouth 2 (two) times daily.   sertraline 50 MG tablet Commonly known as: ZOLOFT Take 50 mg by mouth daily.   traMADol 50 MG tablet Commonly known as: ULTRAM Take 50-100 mg by mouth daily as needed for moderate pain.   Trulicity 1.5 MG/0.5ML Sopn Generic drug: Dulaglutide Inject 1.5 mg into the skin every Sunday.        Follow-up Information     [name], Landis Gandy, PA-C Follow up in 1 week(s).   Specialty: Physician Assistant Contact information: [facility] [place] [address] [phone]                Allergies  Allergen Reactions   Shellfish Allergy Itching   Magnesium-Containing Compounds Hives   Tramadol Other (See Comments)    Makes patient twitch really bad    Consultations: None   Procedures/Studies: DG Chest 2 View  Result Date: 03/27/2021 CLINICAL DATA:  Shortness of breath for 24 hours EXAM: CHEST - 2 VIEW COMPARISON:  08/01/2020 FINDINGS: Normal heart size and mediastinal contours. Low volume chest. No acute infiltrate or edema. No effusion or pneumothorax. No acute osseous findings. IMPRESSION: No active cardiopulmonary disease. Electronically Signed   By: Marnee Spring M.D.   On: 03/27/2021 05:33   CT Angio Chest PE W and/or Wo Contrast  Result Date: 04/01/2021 CLINICAL DATA:  Ongoing shortness of breath, chest and back pain for 1 week seen multiple times for similar symptoms EXAM: CT ANGIOGRAPHY CHEST WITH CONTRAST TECHNIQUE:  Multidetector CT imaging of the chest was performed using the standard protocol during bolus administration of intravenous contrast. Multiplanar CT image reconstructions and MIPs were obtained to evaluate the vascular anatomy. CONTRAST:  55mL OMNIPAQUE IOHEXOL 350 MG/ML SOLN COMPARISON:  Radiograph 03/27/2021, 03/31/2021 FINDINGS: Cardiovascular: Satisfactory opacification the pulmonary arteries to the segmental level. No pulmonary artery filling defects are identified. Central pulmonary arteries are normal caliber. Normal heart size. No pericardial effusion. Motion artifact of the aortic root and ascending aorta. No gross acute aortic abnormality is seen. The left vertebral artery arises directly from the aortic arch. Proximal great vessels are otherwise unremarkable. No major venous abnormality or significant venous reflux. Mediastinum/Nodes: No mediastinal fluid or gas. Normal thyroid gland and thoracic inlet. No acute abnormality of the trachea or esophagus. No worrisome mediastinal, hilar or axillary adenopathy. Lungs/Pleura: Dependent atelectasis. Airways patent. No consolidation, features of edema, pneumothorax, or effusion. No suspicious pulmonary  nodules or masses. Upper Abdomen: No acute abnormalities present in the visualized portions of the upper abdomen. Musculoskeletal: No acute osseous abnormality or suspicious osseous lesion. No worrisome chest wall mass or lesion. Review of the MIP images confirms the above findings. IMPRESSION: No evidence of pulmonary embolism to the segmental level. Minimal atelectatic changes without other acute intrathoracic process. Electronically Signed   By: Kreg Shropshire M.D.   On: 04/01/2021 02:57   DG Chest Port 1 View  Result Date: 03/31/2021 CLINICAL DATA:  Shortness of breath EXAM: PORTABLE CHEST 1 VIEW COMPARISON:  03/27/2021 FINDINGS: The heart size and mediastinal contours are within normal limits. Both lungs are clear. The visualized skeletal structures are  unremarkable. IMPRESSION: No active disease. Electronically Signed   By: Duanne Guess D.O.   On: 03/31/2021 21:28   US PELVIC COMPLETE WITH TRANSVAGINAL  Result Date: 04/01/2021 CLINICAL DATA:  Heavy periods EXAM: TRANSABDOMINAL AND TRANSVAGINAL ULTRASOUND OF PELVIS TECHNIQUE: Both transabdominal and transvaginal ultrasound examinations of the pelvis were performed. Transabdominal technique was performed for global imaging of the pelvis including uterus, ovaries, adnexal regions, and pelvic cul-de-sac. It was necessary to proceed with endovaginal exam following the transabdominal exam to visualize the ovaries and endometrium. COMPARISON:  None FINDINGS: Uterus Measurements: 8.7 x 3.9 x 4.5 cm. = volume: 79 mL. No fibroids or other mass visualized. Endometrium Thickness: 8 mm.  No focal abnormality visualized. Right ovary Measurements: 3.3 x 1.6 x 2.9 cm. = volume: 7.8 mL. Normal appearance/no adnexal mass. Left ovary Measurements: 3.0 x 1.2 x 2.9 cm. = volume: 5.3 mL. Normal appearance/no adnexal mass. Other findings No abnormal free fluid. IMPRESSION: No acute abnormality is noted within the pelvis. Electronically Signed   By: Alcide Clever M.D.   On: 04/01/2021 18:33     Discharge Exam: Vitals:   04/01/21 2015 04/02/21 0702  BP: (!) 141/84 123/80  Pulse: 93 89  Resp: 19 16  Temp: 98 F (36.7 C) 98.4 F (36.9 C)  SpO2: 100% 100%   Vitals:   04/01/21 1354 04/01/21 1726 04/01/21 2015 04/02/21 0702  BP: 135/67 (!) 146/80 (!) 141/84 123/80  Pulse: 87 86 93 89  Resp: Temp: 98.4 F (36.9 C) 98 F (36.7 C) 98 F (36.7 C) 98.4 F (36.9 C)  TempSrc: Oral  Oral Oral  SpO2: 98% 99% 100% 100%  Weight:      Height:        General: Pt is alert, awake, not in acute distress Cardiovascular: RRR, S1/S2 +, no rubs, no gallops Respiratory: CTA bilaterally, no wheezing, no rhonchi Abdominal: Soft, NT, ND, bowel sounds + Extremities: no edema, no cyanosis    The results of  significant diagnostics from this hospitalization (including imaging, microbiology, ancillary and laboratory) are listed below for reference.     Microbiology: Recent Results (from the past 240 hour(s))  Resp Panel by RT-PCR (Flu A&B, Covid) Nasopharyngeal Swab     Status: None   Collection Time: 03/26/21  1:17 AM   Specimen: Nasopharyngeal Swab; Nasopharyngeal(NP) swabs in vial transport medium  Result Value Ref Range Status   SARS Coronavirus 2 by RT PCR NEGATIVE NEGATIVE Final    Comment: (NOTE) SARS-CoV-2 target nucleic acids are NOT DETECTED.  The SARS-CoV-2 RNA is generally detectable in upper respiratory specimens during the acute phase of infection. The lowest concentration of SARS-CoV-2 viral copies this assay can detect is 138 copies/mL. A negative result does not preclude SARS-Cov-2 infection and should not be  used as the sole basis for treatment or other patient management decisions. A negative result may occur with  improper specimen collection/handling, submission of specimen other than nasopharyngeal swab, presence of viral mutation(s) within the areas targeted by this assay, and inadequate number of viral copies(<138 copies/mL). A negative result must be combined with clinical observations, patient history, and epidemiological information. The expected result is Negative.  Fact Sheet for Patients:  BloggerCourse.com  Fact Sheet for Healthcare Providers:  SeriousBroker.it  This test is no t yet approved or cleared by the [place] FDA and  has been authorized for detection and/or diagnosis of SARS-CoV-2 by FDA under an Emergency Use Authorization (EUA). This EUA will remain  in effect (meaning this test can be used) for the duration of the COVID-19 declaration under Section 564(b)(1) of the Act, 21 U.S.C.section 360bbb-3(b)(1), unless the authorization is terminated  or revoked sooner.       Influenza A by  PCR NEGATIVE NEGATIVE Final   Influenza B by PCR NEGATIVE NEGATIVE Final    Comment: (NOTE) The Xpert Xpress SARS-CoV-2/FLU/RSV plus assay is intended as an aid in the diagnosis of influenza from Nasopharyngeal swab specimens and should not be used as a sole basis for treatment. Nasal washings and aspirates are unacceptable for Xpert Xpress SARS-CoV-2/FLU/RSV testing.  Fact Sheet for Patients: BloggerCourse.com  Fact Sheet for Healthcare Providers: SeriousBroker.it  This test is not yet approved or cleared by the [place] FDA and has been authorized for detection and/or diagnosis of SARS-CoV-2 by FDA under an Emergency Use Authorization (EUA). This EUA will remain in effect (meaning this test can be used) for the duration of the COVID-19 declaration under Section 564(b)(1) of the Act, 21 U.S.C. section 360bbb-3(b)(1), unless the authorization is terminated or revoked.  Performed at [facility], [address] [place], [place] [address]   Urine culture     Status: Abnormal   Collection Time: 03/26/21  4:35 AM   Specimen: Urine, Random  Result Value Ref Range Status   Specimen Description   Final    URINE, RANDOM Performed at [facility], [address], [place], [place] [address]    Special Requests   Final    NONE Performed at [facility], [address] [place], [place] [address]    Culture (A)  Final    >=100,000 COLONIES/mL ESCHERICHIA COLI Confirmed Extended Spectrum Beta-Lactamase Producer (ESBL).  In bloodstream infections from ESBL organisms, carbapenems are preferred over piperacillin/tazobactam. They are shown to have a lower risk of mortality.    Report Status 03/28/2021 FINAL  Final   Organism ID, Bacteria ESCHERICHIA COLI (A)  Final      Susceptibility   Escherichia coli - MIC*    AMPICILLIN >=32 RESISTANT Resistant     CEFAZOLIN >=64 RESISTANT  Resistant     CEFEPIME 16 RESISTANT Resistant     CEFTRIAXONE >=64 RESISTANT Resistant     CIPROFLOXACIN 0.5 SENSITIVE Sensitive     GENTAMICIN >=16 RESISTANT Resistant     IMIPENEM <=0.25 SENSITIVE Sensitive     NITROFURANTOIN <=16 SENSITIVE Sensitive     TRIMETH/SULFA >=320 RESISTANT Resistant     AMPICILLIN/SULBACTAM >=32 RESISTANT Resistant     PIP/TAZO <=4 SENSITIVE Sensitive     * >=100,000 COLONIES/mL ESCHERICHIA COLI  Resp Panel by RT-PCR (Flu A&B, Covid) Nasopharyngeal Swab     Status: None   Collection Time: 04/01/21  1:45 AM   Specimen: Nasopharyngeal Swab; Nasopharyngeal(NP) swabs in vial  transport medium  Result Value Ref Range Status   SARS Coronavirus 2 by RT PCR NEGATIVE NEGATIVE Final    Comment: (NOTE) SARS-CoV-2 target nucleic acids are NOT DETECTED.  The SARS-CoV-2 RNA is generally detectable in upper respiratory specimens during the acute phase of infection. The lowest concentration of SARS-CoV-2 viral copies this assay can detect is 138 copies/mL. A negative result does not preclude SARS-Cov-2 infection and should not be used as the sole basis for treatment or other patient management decisions. A negative result may occur with  improper specimen collection/handling, submission of specimen other than nasopharyngeal swab, presence of viral mutation(s) within the areas targeted by this assay, and inadequate number of viral copies(<138 copies/mL). A negative result must be combined with clinical observations, patient history, and epidemiological information. The expected result is Negative.  Fact Sheet for Patients:  BloggerCourse.com  Fact Sheet for Healthcare Providers:  SeriousBroker.it  This test is no t yet approved or cleared by the [place] FDA and  has been authorized for detection and/or diagnosis of SARS-CoV-2 by FDA under an Emergency Use Authorization (EUA). This EUA will remain  in effect  (meaning this test can be used) for the duration of the COVID-19 declaration under Section 564(b)(1) of the Act, 21 U.S.C.section 360bbb-3(b)(1), unless the authorization is terminated  or revoked sooner.       Influenza A by PCR NEGATIVE NEGATIVE Final   Influenza B by PCR NEGATIVE NEGATIVE Final    Comment: (NOTE) The Xpert Xpress SARS-CoV-2/FLU/RSV plus assay is intended as an aid in the diagnosis of influenza from Nasopharyngeal swab specimens and should not be used as a sole basis for treatment. Nasal washings and aspirates are unacceptable for Xpert Xpress SARS-CoV-2/FLU/RSV testing.  Fact Sheet for Patients: BloggerCourse.com  Fact Sheet for Healthcare Providers: SeriousBroker.it  This test is not yet approved or cleared by the [place] FDA and has been authorized for detection and/or diagnosis of SARS-CoV-2 by FDA under an Emergency Use Authorization (EUA). This EUA will remain in effect (meaning this test can be used) for the duration of the COVID-19 declaration under Section 564(b)(1) of the Act, 21 U.S.C. section 360bbb-3(b)(1), unless the authorization is terminated or revoked.  Performed at [facility], [address], [place], [place] [address]      Labs: BNP (last 3 results) Recent Labs    03/27/21 0544  BNP 123.6*   Basic Metabolic Panel: Recent Labs  Lab 03/27/21 0544 03/31/21 2154 03/31/21 2159 04/01/21 0715 04/02/21 0302  NA 131* 137 136 138 138  K 3.1* 2.9* 2.9* 3.1* 3.9  CL 102 103 101 106 108  CO2 19* 25  --  22 23  GLUCOSE 276* 143* 141* 163* 163*  BUN 40* 23* 19 20 21*  CREATININE 1.28* 1.45* 1.40* 1.09* 1.19*  CALCIUM 8.1* 8.5*  --  8.6* 8.3*  MG  --   --   --   --  2.4   Liver Function Tests: Recent Labs  Lab 03/27/21 0544 03/31/21 2154 04/01/21 0715  AST 11* 10* 9*  ALT 14 11 11   ALKPHOS 71 72 72  BILITOT 0.9 0.5 0.6  PROT 6.4* 7.3 6.9  ALBUMIN  2.3* 2.6* 2.5*   No results for input(s): LIPASE, AMYLASE in the last 168 hours. No results for input(s): AMMONIA in the last 168 hours. CBC: Recent Labs  Lab 03/27/21 0544 03/31/21 2154 03/31/21 2159 04/01/21 0715 04/02/21 0302  WBC 10.2 11.0*  --  9.3 8.9  NEUTROABS 7.9* 8.1*  --   --   --   HGB 7.5* 8.0* 7.8* 7.6* 8.1*  HCT 21.5* 23.6* 23.0* 22.4* 24.2*  MCV 75.7* 77.4*  --  77.5* 79.3*  PLT 185 353  --  331 319   Cardiac Enzymes: No results for input(s): CKTOTAL, CKMB, CKMBINDEX, TROPONINI in the last 168 hours. BNP: Invalid input(s): POCBNP CBG: Recent Labs  Lab 04/01/21 0741 04/01/21 1133 04/01/21 1722 04/01/21 2144  GLUCAP 156* 163* 160* 168*   D-Dimer No results for input(s): DDIMER in the last 72 hours. Hgb A1c Recent Labs    04/01/21 0715  HGBA1C 7.5*   Lipid Profile No results for input(s): CHOL, HDL, LDLCALC, TRIG, CHOLHDL, LDLDIRECT in the last 72 hours. Thyroid function studies No results for input(s): TSH, T4TOTAL, T3FREE, THYROIDAB in the last 72 hours.  Invalid input(s): FREET3 Anemia work up Recent Labs    04/01/21 0715  VITAMINB12 1,385*  FOLATE 18.0  FERRITIN 108  TIBC 239*  IRON 26*  RETICCTPCT 2.4   Urinalysis    Component Value Date/Time   COLORURINE YELLOW 03/31/2021 2130   APPEARANCEUR HAZY (A) 03/31/2021 2130   LABSPEC 1.012 03/31/2021 2130   PHURINE 6.0 03/31/2021 2130   GLUCOSEU 50 (A) 03/31/2021 2130   HGBUR SMALL (A) 03/31/2021 2130   BILIRUBINUR NEGATIVE 03/31/2021 2130   KETONESUR NEGATIVE 03/31/2021 2130   PROTEINUR >=300 (A) 03/31/2021 2130   UROBILINOGEN 0.2 11/16/2014 0839   NITRITE NEGATIVE 03/31/2021 2130   LEUKOCYTESUR NEGATIVE 03/31/2021 2130   Sepsis Labs Invalid input(s): PROCALCITONIN,  WBC,  LACTICIDVEN Microbiology Recent Results (from the past 240 hour(s))  Resp Panel by RT-PCR (Flu A&B, Covid) Nasopharyngeal Swab     Status: None   Collection Time: 03/26/21  1:17 AM   Specimen: Nasopharyngeal  Swab; Nasopharyngeal(NP) swabs in vial transport medium  Result Value Ref Range Status   SARS Coronavirus 2 by RT PCR NEGATIVE NEGATIVE Final    Comment: (NOTE) SARS-CoV-2 target nucleic acids are NOT DETECTED.  The SARS-CoV-2 RNA is generally detectable in upper respiratory specimens during the acute phase of infection. The lowest concentration of SARS-CoV-2 viral copies this assay can detect is 138 copies/mL. A negative result does not preclude SARS-Cov-2 infection and should not be used as the sole basis for treatment or other patient management decisions. A negative result may occur with  improper specimen collection/handling, submission of specimen other than nasopharyngeal swab, presence of viral mutation(s) within the areas targeted by this assay, and inadequate number of viral copies(<138 copies/mL). A negative result must be combined with clinical observations, patient history, and epidemiological information. The expected result is Negative.  Fact Sheet for Patients:  BloggerCourse.comhttps://www.fda.gov/media/152166/download  Fact Sheet for Healthcare Providers:  SeriousBroker.ithttps://www.fda.gov/media/152162/download  This test is no t yet approved or cleared by the [place] FDA and  has been authorized for detection and/or diagnosis of SARS-CoV-2 by FDA under an Emergency Use Authorization (EUA). This EUA will remain  in effect (meaning this test can be used) for the duration of the COVID-19 declaration under Section 564(b)(1) of the Act, 21 U.S.C.section 360bbb-3(b)(1), unless the authorization is terminated  or revoked sooner.       Influenza A by PCR NEGATIVE NEGATIVE Final   Influenza B by PCR NEGATIVE NEGATIVE Final    Comment: (NOTE) The Xpert Xpress SARS-CoV-2/FLU/RSV plus assay is intended as an aid in the diagnosis of influenza from Nasopharyngeal swab specimens and should not be used as a sole basis for treatment. Nasal  washings and aspirates are unacceptable for XpBloggerCourse.com.  Fact Sheet for Patients: https://www.fda.gov/media/152166/download  Fact Sheet for Healthcare Providers: SeriousBroker.it  This test is not yet approved or cleared by the [place] FDA and has been authorized for detection and/or diagnosis of SARS-CoV-2 by FDA under an Emergency Use Authorization (EUA). This EUA will remain in effect (meaning this test can be used) for the duration of the COVID-19 declaration under Section 564(b)(1) of the Act, 21 U.S.C. section 360bbb-3(b)(1), unless the authorization is terminated or revoked.  Performed at [facility], [address] [place], [place] [address]   Urine culture     Status: Abnormal   Collection Time: 03/26/21  4:35 AM   Specimen: Urine, Random  Result Value Ref Range Status   Specimen Description   Final    URINE, RANDOM Performed at [facility], [address], [place], [place] [address]    Special Requests   Final    NONE Performed at [facility], [address] [place], [place] [address]    Culture (A)  Final    >=100,000 COLONIES/mL ESCHERICHIA COLI Confirmed Extended Spectrum Beta-Lactamase Producer (ESBL).  In bloodstream infections from ESBL organisms, carbapenems are preferred over piperacillin/tazobactam. They are shown to have a lower risk of mortality.    Report Status 03/28/2021 FINAL  Final   Organism ID, Bacteria ESCHERICHIA COLI (A)  Final      Susceptibility   Escherichia coli - MIC*    AMPICILLIN >=32 RESISTANT Resistant     CEFAZOLIN >=64 RESISTANT Resistant     CEFEPIME 16 RESISTANT Resistant     CEFTRIAXONE >=64 RESISTANT Resistant     CIPROFLOXACIN 0.5 SENSITIVE Sensitive     GENTAMICIN >=16 RESISTANT Resistant     IMIPENEM <=0.25 SENSITIVE Sensitive     NITROFURANTOIN <=16 SENSITIVE Sensitive     TRIMETH/SULFA >=320 RESISTANT Resistant     AMPICILLIN/SULBACTAM >=32 RESISTANT  Resistant     PIP/TAZO <=4 SENSITIVE Sensitive     * >=100,000 COLONIES/mL ESCHERICHIA COLI  Resp Panel by RT-PCR (Flu A&B, Covid) Nasopharyngeal Swab     Status: None   Collection Time: 04/01/21  1:45 AM   Specimen: Nasopharyngeal Swab; Nasopharyngeal(NP) swabs in vial transport medium  Result Value Ref Range Status   SARS Coronavirus 2 by RT PCR NEGATIVE NEGATIVE Final    Comment: (NOTE) SARS-CoV-2 target nucleic acids are NOT DETECTED.  The SARS-CoV-2 RNA is generally detectable in upper respiratory specimens during the acute phase of infection. The lowest concentration of SARS-CoV-2 viral copies this assay can detect is 138 copies/mL. A negative result does not preclude SARS-Cov-2 infection and should not be used as the sole basis for treatment or other patient management decisions. A negative result may occur with  improper specimen collection/handling, submission of specimen other than nasopharyngeal swab, presence of viral mutation(s) within the areas targeted by this assay, and inadequate number of viral copies(<138 copies/mL). A negative result must be combined with clinical observations, patient history, and epidemiological information. The expected result is Negative.  Fact Sheet for Patients:  BloggerCourse.com  Fact Sheet for Healthcare Providers:  SeriousBroker.it  This test is no t yet approved or cleared by the [place] FDA and  has been authorized for detection and/or diagnosis of SARS-CoV-2 by FDA under an Emergency Use Authorization (EUA). This EUA will remain  in effect (meaning this test can be used) for the duration of the COVID-19 declaration under Section 564(b)(1)  of the Act, 21 U.S.C.section 360bbb-3(b)(1), unless the authorization is terminated  or revoked sooner.       Influenza A by PCR NEGATIVE NEGATIVE Final   Influenza B by PCR NEGATIVE NEGATIVE Final    Comment: (NOTE) The Xpert Xpress  SARS-CoV-2/FLU/RSV plus assay is intended as an aid in the diagnosis of influenza from Nasopharyngeal swab specimens and should not be used as a sole basis for treatment. Nasal washings and aspirates are unacceptable for Xpert Xpress SARS-CoV-2/FLU/RSV testing.  Fact Sheet for Patients: BloggerCourse.com  Fact Sheet for Healthcare Providers: SeriousBroker.it  This test is not yet approved or cleared by the [place] FDA and has been authorized for detection and/or diagnosis of SARS-CoV-2 by FDA under an Emergency Use Authorization (EUA). This EUA will remain in effect (meaning this test can be used) for the duration of the COVID-19 declaration under Section 564(b)(1) of the Act, 21 U.S.C. section 360bbb-3(b)(1), unless the authorization is terminated or revoked.  Performed at [facility], [address], [place], [place] [address]      Time coordinating discharge: Over 30 minutes  SIGNED:   Hughie Closs, MD  [facility] 04/02/2021, 7:43 AM  If 7PM-7AM, please contact night-coverage [email or web address]

## 2021-04-02 NOTE — Plan of Care (Signed)

## 2021-04-02 NOTE — Progress Notes (Signed)
Discussed with patient discharge instructions, they verbalized agreement and understanding.  Patient to leave in private vehicle with all belongings.   

## 2021-04-05 DIAGNOSIS — R531 Weakness: Secondary | ICD-10-CM | POA: Diagnosis present

## 2021-04-05 DIAGNOSIS — E1140 Type 2 diabetes mellitus with diabetic neuropathy, unspecified: Secondary | ICD-10-CM | POA: Insufficient documentation

## 2021-04-05 DIAGNOSIS — R5381 Other malaise: Secondary | ICD-10-CM | POA: Diagnosis not present

## 2021-04-05 DIAGNOSIS — R1013 Epigastric pain: Secondary | ICD-10-CM | POA: Diagnosis not present

## 2021-04-05 DIAGNOSIS — Z7984 Long term (current) use of oral hypoglycemic drugs: Secondary | ICD-10-CM | POA: Diagnosis not present

## 2021-04-05 DIAGNOSIS — R5383 Other fatigue: Secondary | ICD-10-CM | POA: Diagnosis not present

## 2021-04-05 DIAGNOSIS — K921 Melena: Secondary | ICD-10-CM | POA: Diagnosis not present

## 2021-04-05 DIAGNOSIS — Z794 Long term (current) use of insulin: Secondary | ICD-10-CM | POA: Insufficient documentation

## 2021-04-06 ENCOUNTER — Encounter (HOSPITAL_COMMUNITY): Payer: Self-pay | Admitting: Emergency Medicine

## 2021-04-06 ENCOUNTER — Emergency Department (HOSPITAL_COMMUNITY): Payer: BC Managed Care – PPO

## 2021-04-06 ENCOUNTER — Emergency Department (HOSPITAL_COMMUNITY)
Admission: EM | Admit: 2021-04-06 | Discharge: 2021-04-06 | Disposition: A | Discharge status: Home / Self Care | Payer: BC Managed Care – PPO | Attending: Emergency Medicine | Admitting: Emergency Medicine

## 2021-04-06 DIAGNOSIS — R531 Weakness: Secondary | ICD-10-CM

## 2021-04-06 LAB — I-STAT BETA HCG BLOOD, ED (MC, WL, AP ONLY): I-stat hCG, quantitative: <5 m[IU]/mL (ref <5)

## 2021-04-06 LAB — CBC
HCT: 24.8 % — ABNORMAL LOW (ref 36.0–46.0)
Hemoglobin: 8.3 g/dL — ABNORMAL LOW (ref 12.0–15.0)
MCH: 26.3 pg (ref 26.0–34.0)
MCHC: 33.5 g/dL (ref 30.0–36.0)
MCV: 78.7 fL — ABNORMAL LOW (ref 80.0–100.0)
Platelets: 356 10*3/uL (ref 150–400)
RBC: 3.15 MIL/uL — ABNORMAL LOW (ref 3.87–5.11)
RDW: 15.6 % — ABNORMAL HIGH (ref 11.5–15.5)
WBC: 7.4 10*3/uL (ref 4.0–10.5)
nRBC: 0 % (ref 0.0–0.2)

## 2021-04-06 LAB — COMPREHENSIVE METABOLIC PANEL
ALT: 11 U/L (ref 0–44)
AST: 9 U/L — ABNORMAL LOW (ref 15–41)
Albumin: 2.2 g/dL — ABNORMAL LOW (ref 3.5–5.0)
Alkaline Phosphatase: 68 U/L (ref 38–126)
Anion gap: 4 — ABNORMAL LOW (ref 5–15)
BUN: 24 mg/dL — ABNORMAL HIGH (ref 6–20)
CO2: 21 mmol/L — ABNORMAL LOW (ref 22–32)
Calcium: 8.4 mg/dL — ABNORMAL LOW (ref 8.9–10.3)
Chloride: 111 mmol/L (ref 98–111)
Creatinine, Ser: 1.2 mg/dL — ABNORMAL HIGH (ref 0.44–1.00)
GFR, Estimated: 59 mL/min — ABNORMAL LOW (ref >60)
Glucose, Bld: 224 mg/dL — ABNORMAL HIGH (ref 70–99)
Potassium: 3.9 mmol/L (ref 3.5–5.1)
Sodium: 136 mmol/L (ref 135–145)
Total Bilirubin: 0.6 mg/dL (ref 0.3–1.2)
Total Protein: 6.7 g/dL (ref 6.5–8.1)

## 2021-04-06 LAB — HEMOGLOBIN AND HEMATOCRIT, BLOOD
HCT: 23.6 % — ABNORMAL LOW (ref 36.0–46.0)
Hemoglobin: 8 g/dL — ABNORMAL LOW (ref 12.0–15.0)

## 2021-04-06 LAB — TSH: TSH: 2.096 u[IU]/mL (ref 0.350–4.500)

## 2021-04-06 LAB — POC OCCULT BLOOD, ED: Fecal Occult Bld: NEGATIVE

## 2021-04-06 MED ORDER — SODIUM CHLORIDE 0.9 % IV BOLUS
1000.0000 mL | Freq: Once | INTRAVENOUS | Status: AC
  Administered 2021-04-06: 1000 mL via INTRAVENOUS

## 2021-04-06 MED ORDER — IOHEXOL 300 MG/ML  SOLN
100.0000 mL | Freq: Once | INTRAMUSCULAR | Status: AC | PRN
  Administered 2021-04-06: 100 mL via INTRAVENOUS

## 2021-04-06 NOTE — ED Triage Notes (Signed)
Pt reports getting a blood transfusion last Tuesday, she continues to feel "run down" and feels as if she is "still bleeding."  Reports diarrhea and bright red blood in stool.  8/10 kidney pain as well.

## 2021-04-06 NOTE — Discharge Instructions (Addendum)
Drink plenty of fluids and get plenty of rest.  Continue taking iron as previously recommended.  Follow-up with your primary doctor in the next week, and return to the ER if symptoms significantly worsen or change.

## 2021-04-06 NOTE — ED Provider Notes (Signed)
[facility] EMERGENCY DEPARTMENT Provider Note   CSN: [id number] Arrival date & time: 04/05/21  2350     History Chief Complaint  Patient presents with   Fatigue   Blood In Stools    [name] is a 40 y.o. female.  Patient is a 40 year old female with past medical history of diabetes, diabetic neuropathy, hyperlipidemia, and iron deficiency anemia.  Patient presents today with complaints of weakness and fatigue.  Patient reports a several week history of this.  She has been seen here multiple times and admitted once at [facility] long.  She was found to have a hemoglobin of close to 7 and received a blood transfusion.  She was discharged 3 days ago, but continues to feel poorly.  She describes episodes of loose stool, weakness, fatigue, and generalized malaise.  She reports noticing blood on the toilet paper when she wipes, but denies rectal pain.  She does describe epigastric discomfort, but denies fevers or chills.  The history is provided by the patient.      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     Family History  Problem Relation Age of Onset   Diabetes Mother    Hypertension Mother    Hyperlipidemia Mother    Cancer Father     Social History   Tobacco Use   Smoking status: Never   Smokeless tobacco: Never  Vaping Use   Vaping Use: Never used  Substance Use Topics   Alcohol use: No   Drug use: No    Home Medications Prior to Admission medications    Medication Sig Start Date End Date Taking? Authorizing Provider  atorvastatin (LIPITOR) 40 MG tablet Take 40 mg by mouth daily.    [provider]  baclofen (LIORESAL) 20 MG tablet Take 20 mg by mouth at bedtime.    [provider]  celecoxib (CELEBREX) 200 MG capsule Take 200 mg by mouth daily as needed for mild pain.  07/18/20   [provider]  empagliflozin (JARDIANCE) 25 MG TABS tablet Take 25 mg by mouth daily.    [provider]  famotidine (PEPCID) 20 MG tablet Take 20 mg by mouth 2 (two) times daily as needed for heartburn or indigestion.    [provider]  Ferrous Sulfate (IRON) 325 (65 Fe) MG TABS Take 2 tablets every other day. 03/26/21   Molpus, John, MD  furosemide (LASIX) 40 MG tablet Take 40 mg by mouth daily.    [provider]  insulin degludec (TRESIBA) 100 UNIT/ML FlexTouch Pen Inject 15 Units into the skin daily.    [provider]  lipase/protease/amylase (CREON) 36000 UNITS CPEP capsule Take 72,000-144,000 Units by mouth See admin instructions. Take 4 capsules (144,000 units) with meals & Take 2 capsules (72,000 units) with snacks 10/29/20   [provider]  nitrofurantoin, macrocrystal-monohydrate, (MACROBID) 100 MG capsule Take 100 mg by mouth 2 (two) times daily. 03/31/21   [provider]  ondansetron (ZOFRAN) 8 MG tablet Take 1 tablet (8 mg total) by mouth every 8 (eight) hours as needed for nausea or vomiting. 03/26/21   Molpus, John, MD  polyethylene glycol (MIRALAX / GLYCOLAX) 17 g packet Take 17 g by mouth daily as needed for mild constipation.    [provider]  pregabalin (LYRICA) 150 MG capsule Take 150 mg by mouth 2 (two) times daily. 05/07/20   [provider]  sertraline (ZOLOFT) 50 MG tablet Take 50 mg by mouth daily.    [provider]  traMADol (ULTRAM) 50 MG tablet Take 50-100 mg by mouth daily as needed for moderate pain. 12/17/20 06/15/21  [provider]  TRULICITY 1.5 MG/0.5ML SOPN Inject 1.5 mg into the skin every Sunday. 03/19/21   [provider]    Allergies    Shellfish allergy, Magnesium-containing compounds, and Tramadol  Review of Systems   Review of Systems  All other systems reviewed and are negative.  Physical Exam Updated Vital Signs BP 134/81   Pulse 85   Temp 98.5 F (36.9 C) (Oral)   Resp 20   LMP 03/26/2021   SpO2 99%   Physical Exam Vitals and nursing note reviewed.  Constitutional:      General: She is not in acute distress.    Appearance: She is well-developed. She is not diaphoretic.  HENT:     Head: Normocephalic and atraumatic.  Cardiovascular:     Rate and Rhythm: Normal rate and regular rhythm.     Heart sounds: No murmur heard.   No friction rub. No gallop.  Pulmonary:     Effort: Pulmonary effort is normal. No respiratory distress.     Breath sounds: Normal breath sounds. No wheezing.  Abdominal:     General: Bowel sounds are normal. There is no distension.     Palpations: Abdomen is soft.     Tenderness: There is no abdominal tenderness.  Musculoskeletal:        General: Normal range of motion.     Cervical back: Normal range of motion and neck supple.  Skin:    General: Skin is warm and dry.  Neurological:     General: No focal deficit present.     Mental Status: She is alert and oriented to person, place, and time.    ED Results / Procedures / Treatments   Labs (all labs ordered are listed, but only abnormal results are displayed) Labs Reviewed  COMPREHENSIVE METABOLIC PANEL - Abnormal; Notable for the following components:      Result Value   CO2 21 (*)    Glucose, Bld 224 (*)    BUN 24 (*)    Creatinine, Ser 1.20 (*)    Calcium 8.4 (*)    Albumin 2.2 (*)    AST 9 (*)    GFR, Estimated 59 (*)    Anion gap 4 (*)    All other components within normal limits  CBC - Abnormal; Notable for the following components:   RBC 3.15 (*)    Hemoglobin 8.3 (*)     HCT 24.8 (*)    MCV 78.7 (*)    RDW 15.6 (*)    All other components within normal limits  TSH  HEMOGLOBIN AND HEMATOCRIT, BLOOD  I-STAT BETA HCG BLOOD, ED ([facility], [facility], [facility] ONLY)  POC OCCULT BLOOD, ED  TYPE AND  SCREEN    EKG None  Radiology No results found.  Procedures Procedures   Medications Ordered in ED Medications  sodium chloride 0.9 % bolus 1,000 mL (has no administration in time range)    ED Course  I have reviewed the triage vital signs and the nursing notes.  Pertinent labs & imaging results that were available during my care of the patient were reviewed by me and considered in my medical decision making (see chart for details).    MDM Rules/Calculators/A&P  Patient is a 41 year old female presenting with complaints of fatigue and weakness that has been ongoing for several weeks.  She was admitted for a blood transfusion and discharged 3 days ago.  When she left the hospital her hemoglobin was 8.1, today it is 8.3.  Remainder of her laboratory studies are unremarkable including electrolytes, TSH and CT of the abdomen and pelvis.  Patient hydrated with normal saline and appears stable for discharge.  I am certain as to the etiology of her symptoms.  Final Clinical Impression(s) / ED Diagnoses Final diagnoses:  None    Rx / DC Orders ED Discharge Orders     None        Geoffery Lyons, MD 04/06/21 [phone]

## 2021-04-07 LAB — TYPE AND SCREEN
ABO/RH(D): AB POS
Antibody Screen: POSITIVE
Donor AG Type: NEGATIVE
Donor AG Type: NEGATIVE
Unit division: 0
Unit division: 0

## 2021-04-07 LAB — BPAM RBC
Blood Product Expiration Date: 202208062359
Blood Product Expiration Date: 202208112359
Unit Type and Rh: 8400
Unit Type and Rh: 8400

## 2021-04-08 ENCOUNTER — Other Ambulatory Visit: Payer: Self-pay

## 2021-04-08 ENCOUNTER — Ambulatory Visit (INDEPENDENT_AMBULATORY_CARE_PROVIDER_SITE_OTHER): Payer: BC Managed Care – PPO | Admitting: Podiatry

## 2021-04-08 DIAGNOSIS — L03119 Cellulitis of unspecified part of limb: Secondary | ICD-10-CM | POA: Diagnosis not present

## 2021-04-08 DIAGNOSIS — E1149 Type 2 diabetes mellitus with other diabetic neurological complication: Secondary | ICD-10-CM

## 2021-04-08 DIAGNOSIS — T300 Burn of unspecified body region, unspecified degree: Secondary | ICD-10-CM

## 2021-04-08 DIAGNOSIS — L02619 Cutaneous abscess of unspecified foot: Secondary | ICD-10-CM

## 2021-04-08 MED ORDER — AMOXICILLIN-POT CLAVULANATE 875-125 MG PO TABS: 1.0000 | ORAL_TABLET | Freq: Two times a day (BID) | ORAL | 0 refills | Status: DC
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
## 2021-04-11 LAB — WOUND CULTURE
MICRO NUMBER:: 12136435
SPECIMEN QUALITY:: ADEQUATE

## 2021-04-17 ENCOUNTER — Other Ambulatory Visit: Payer: Self-pay

## 2021-04-17 ENCOUNTER — Ambulatory Visit (INDEPENDENT_AMBULATORY_CARE_PROVIDER_SITE_OTHER): Payer: BC Managed Care – PPO | Admitting: Podiatry

## 2021-04-17 VITALS — BP 136/75 | HR 91 | Temp 98.3°F

## 2021-04-17 DIAGNOSIS — E1149 Type 2 diabetes mellitus with other diabetic neurological complication: Secondary | ICD-10-CM | POA: Diagnosis not present

## 2021-04-17 DIAGNOSIS — L02619 Cutaneous abscess of unspecified foot: Secondary | ICD-10-CM

## 2021-04-17 DIAGNOSIS — L03119 Cellulitis of unspecified part of limb: Secondary | ICD-10-CM

## 2021-04-17 DIAGNOSIS — T300 Burn of unspecified body region, unspecified degree: Secondary | ICD-10-CM

## 2021-04-17 MED ORDER — CIPROFLOXACIN HCL 500 MG PO TABS: 500.0000 mg | ORAL_TABLET | Freq: Two times a day (BID) | ORAL | 0 refills | Status: DC

## 2021-04-18 ENCOUNTER — Telehealth: Payer: Self-pay | Admitting: Urology

## 2021-04-18 NOTE — Telephone Encounter (Signed)
PLACED ORDER WITH [facility] ONLINE FOR THE KNEE SCOOTER.

## 2021-04-20 ENCOUNTER — Encounter (HOSPITAL_COMMUNITY): Payer: Self-pay | Admitting: Emergency Medicine

## 2021-04-20 ENCOUNTER — Emergency Department (HOSPITAL_COMMUNITY)
Admission: EM | Admit: 2021-04-20 | Discharge: 2021-04-20 | Disposition: A | Discharge status: Home / Self Care | Payer: BC Managed Care – PPO | Attending: Emergency Medicine | Admitting: Emergency Medicine

## 2021-04-20 DIAGNOSIS — S30821A Blister (nonthermal) of abdominal wall, initial encounter: Secondary | ICD-10-CM | POA: Diagnosis not present

## 2021-04-20 DIAGNOSIS — Z7984 Long term (current) use of oral hypoglycemic drugs: Secondary | ICD-10-CM | POA: Insufficient documentation

## 2021-04-20 DIAGNOSIS — E1169 Type 2 diabetes mellitus with other specified complication: Secondary | ICD-10-CM | POA: Diagnosis not present

## 2021-04-20 DIAGNOSIS — X58XXXA Exposure to other specified factors, initial encounter: Secondary | ICD-10-CM | POA: Insufficient documentation

## 2021-04-20 DIAGNOSIS — Z794 Long term (current) use of insulin: Secondary | ICD-10-CM | POA: Diagnosis not present

## 2021-04-20 DIAGNOSIS — Z79899 Other long term (current) drug therapy: Secondary | ICD-10-CM | POA: Insufficient documentation

## 2021-04-20 DIAGNOSIS — E7800 Pure hypercholesterolemia, unspecified: Secondary | ICD-10-CM | POA: Insufficient documentation

## 2021-04-20 DIAGNOSIS — E1140 Type 2 diabetes mellitus with diabetic neuropathy, unspecified: Secondary | ICD-10-CM | POA: Diagnosis not present

## 2021-04-20 MED ORDER — BACITRACIN ZINC 500 UNIT/GM EX OINT
TOPICAL_OINTMENT | Freq: Two times a day (BID) | CUTANEOUS | Status: DC
  Filled 2021-04-20: qty 1.8

## 2021-04-20 NOTE — Discharge Instructions (Addendum)
Please apply bacitracin (neosporin) to the blister and apply dressing to prevent friction. If the blister pops that is okay. Keep wound clean and dry at that point.   Follow up with your PCP regarding ED visit

## 2021-04-20 NOTE — ED Provider Notes (Signed)
[facility]-EMERGENCY DEPT Provider Note   CSN: [id number] Arrival date & time: 04/20/21  1522     History Chief Complaint  Patient presents with   Blister    [name] is a 40 y.o. female who presents to the ED today with complaint of blister to right side of abdomen that she noticed yesterday.  She reports when she got home from work yesterday she noticed a blister to her abdomen.  She states that it is painful.  She states that she has not tried taking anything for pain or applying anything to the blister itself.  She states that she is try to prevent any friction to the area.  She is unsure what caused the blister in the first place.  She denies any fevers or chills.   The history is provided by the patient and medical records.      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     Family History  Problem Relation Age of Onset   Diabetes Mother    Hypertension Mother    Hyperlipidemia Mother    Cancer Father     Social History   Tobacco Use   Smoking status: Never   Smokeless tobacco: Never  Vaping Use   Vaping Use: Never used  Substance Use Topics   Alcohol use: No   Drug use: No    Home Medications Prior to Admission medications   Medication Sig Start Date End Date Taking? Authorizing Provider  amoxicillin-clavulanate (AUGMENTIN) 875-125 MG tablet Take 1 tablet by mouth 2 (two) times daily. 04/08/21   Vivi Barrack, DPM  atorvastatin (LIPITOR) 40  MG tablet Take 40 mg by mouth daily.    [provider]  baclofen (LIORESAL) 20 MG tablet Take 20 mg by mouth at bedtime.    [provider]  celecoxib (CELEBREX) 200 MG capsule Take 200 mg by mouth daily as needed for mild pain.  07/18/20   [provider]  ciprofloxacin (CIPRO) 500 MG tablet Take 1 tablet (500 mg total) by mouth 2 (two) times daily. 04/17/21   Vivi Barrack, DPM  empagliflozin (JARDIANCE) 25 MG TABS tablet Take 25 mg by mouth daily.    [provider]  famotidine (PEPCID) 20 MG tablet Take 20 mg by mouth 2 (two) times daily as needed for heartburn or indigestion.    [provider]  Ferrous Sulfate (IRON) 325 (65 Fe) MG TABS Take 2 tablets every other day. 03/26/21   Molpus, John, MD  furosemide (LASIX) 40 MG tablet Take 40 mg by mouth daily.    [provider]  insulin degludec (TRESIBA) 100 UNIT/ML FlexTouch Pen Inject 15 Units into the skin daily.    [provider]  lipase/protease/amylase (CREON) 36000 UNITS CPEP capsule Take 72,000-144,000 Units by mouth See admin instructions. Take 4  capsules (144,000 units) with meals & Take 2 capsules (72,000 units) with snacks 10/29/20   [provider]  nitrofurantoin, macrocrystal-monohydrate, (MACROBID) 100 MG capsule Take 100 mg by mouth 2 (two) times daily. 03/31/21   [provider]  ondansetron (ZOFRAN) 8 MG tablet Take 1 tablet (8 mg total) by mouth every 8 (eight) hours as needed for nausea or vomiting. 03/26/21   Molpus, John, MD  polyethylene glycol (MIRALAX / GLYCOLAX) 17 g packet Take 17 g by mouth daily as needed for mild constipation.    [provider]  pregabalin (LYRICA) 150 MG capsule Take 150 mg by mouth 2 (two) times daily. 05/07/20   [provider]  sertraline (ZOLOFT) 50 MG tablet Take 50 mg by mouth daily.    [provider]  traMADol (ULTRAM) 50 MG tablet Take 50-100 mg by mouth daily as needed for moderate  pain. 12/17/20 06/15/21  [provider]  TRULICITY 1.5 MG/0.5ML SOPN Inject 1.5 mg into the skin every Sunday. 03/19/21   [provider]    Allergies    Shellfish allergy, Magnesium-containing compounds, and Tramadol  Review of Systems   Review of Systems  Constitutional:  Negative for chills and fever.  Skin:        + blister  All other systems reviewed and are negative.  Physical Exam Updated Vital Signs BP (!) 139/93   Pulse 80   Temp 98.1 F (36.7 C)   Resp 16   LMP 03/26/2021   SpO2 98%   Physical Exam Vitals and nursing note reviewed.  Constitutional:      Appearance: She is not ill-appearing.  HENT:     Head: Normocephalic and atraumatic.  Eyes:     Conjunctiva/sclera: Conjunctivae normal.  Cardiovascular:     Rate and Rhythm: Normal rate and regular rhythm.  Pulmonary:     Effort: Pulmonary effort is normal.     Breath sounds: Normal breath sounds.  Skin:    General: Skin is warm and dry.     Coloration: Skin is not jaundiced.     Comments: 3 x 1 cm area of intact blister to abdominal wall  Neurological:     Mental Status: She is alert.    ED Results / Procedures / Treatments   Labs (all labs ordered are listed, but only abnormal results are displayed) Labs Reviewed - No data to display  EKG None  Radiology No results found.  Procedures Procedures   Medications Ordered in ED Medications  bacitracin ointment (has no administration in time range)    ED Course  I have reviewed the triage vital signs and the nursing notes.  Pertinent labs & imaging results that were available during my care of the patient were reviewed by me and considered in my medical decision making (see chart for details).    MDM Rules/Calculators/A&P                           39  year old female who presents to the ED today with complaint of blister to her abdomen that she noticed yesterday, unknown what caused blister.  Arrives to the ED today with  stable vital signs.  Has no other complaints besides some pain around the blister site.  On my exam she has intact blister to the right abdominal wall.  She has no surrounding erythema.  She does have a history of diabetes however given the blister is intact without signs of infection I  do not feel she requires antibiotics at this time.  Will apply bacitracin ointment as well as dressing to prevent friction.  Patient instructed follow-up with PCP for same.  Stable for discharge  This note was prepared using Dragon voice recognition software and may include unintentional dictation errors due to the inherent limitations of voice recognition software.  Final Clinical Impression(s) / ED Diagnoses Final diagnoses:  Blister (nonthermal) of abdominal wall, initial encounter    Rx / DC Orders ED Discharge Orders     None        Discharge Instructions      Please apply bacitracin (neosporin) to the blister and apply dressing to prevent friction. If the blister pops that is okay. Keep wound clean and dry at that point.   Follow up with your PCP regarding ED visit       Tanda Rockers, Cordelia Poche 04/20/21 1743    Charlynne Pander, MD 04/20/21 [phone]

## 2021-04-20 NOTE — ED Notes (Signed)
Margaux, PA at bedside.  

## 2021-04-20 NOTE — ED Triage Notes (Signed)
Patient c/o single blister to abdomen noted yesterday after work. States painful area surrounding blister.
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
## 2021-04-28 ENCOUNTER — Other Ambulatory Visit: Payer: Self-pay

## 2021-04-28 ENCOUNTER — Ambulatory Visit (INDEPENDENT_AMBULATORY_CARE_PROVIDER_SITE_OTHER): Payer: BC Managed Care – PPO | Admitting: Podiatry

## 2021-04-28 DIAGNOSIS — T300 Burn of unspecified body region, unspecified degree: Secondary | ICD-10-CM

## 2021-04-28 DIAGNOSIS — E1149 Type 2 diabetes mellitus with other diabetic neurological complication: Secondary | ICD-10-CM
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
## 2021-05-08 ENCOUNTER — Ambulatory Visit (INDEPENDENT_AMBULATORY_CARE_PROVIDER_SITE_OTHER): Payer: BC Managed Care – PPO | Admitting: Podiatry

## 2021-05-08 ENCOUNTER — Other Ambulatory Visit: Payer: Self-pay

## 2021-05-08 ENCOUNTER — Ambulatory Visit (INDEPENDENT_AMBULATORY_CARE_PROVIDER_SITE_OTHER): Payer: BC Managed Care – PPO

## 2021-05-08 DIAGNOSIS — S9030XA Contusion of unspecified foot, initial encounter: Secondary | ICD-10-CM

## 2021-05-08 DIAGNOSIS — E1149 Type 2 diabetes mellitus with other diabetic neurological complication: Secondary | ICD-10-CM

## 2021-05-08 DIAGNOSIS — M79674 Pain in right toe(s): Secondary | ICD-10-CM

## 2021-05-08 DIAGNOSIS — M79675 Pain in left toe(s): Secondary | ICD-10-CM | POA: Diagnosis not present

## 2021-05-08 DIAGNOSIS — B351 Tinea unguium: Secondary | ICD-10-CM

## 2021-05-08 DIAGNOSIS — T300 Burn of unspecified body region, unspecified degree: Secondary | ICD-10-CM

## 2021-05-08 NOTE — Patient Instructions (Signed)
I would recommend going to urgent care to have the hip and arm evaluated after the fall today.
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
## 2021-05-13 ENCOUNTER — Other Ambulatory Visit: Payer: Self-pay

## 2021-05-13 ENCOUNTER — Encounter (HOSPITAL_BASED_OUTPATIENT_CLINIC_OR_DEPARTMENT_OTHER): Payer: BC Managed Care – PPO | Attending: Internal Medicine | Admitting: Internal Medicine

## 2021-05-13 DIAGNOSIS — S91302A Unspecified open wound, left foot, initial encounter: Secondary | ICD-10-CM

## 2021-05-13 DIAGNOSIS — E1142 Type 2 diabetes mellitus with diabetic polyneuropathy: Secondary | ICD-10-CM | POA: Insufficient documentation

## 2021-05-13 DIAGNOSIS — T25029A Burn of unspecified degree of unspecified foot, initial encounter: Secondary | ICD-10-CM

## 2021-05-13 DIAGNOSIS — E11621 Type 2 diabetes mellitus with foot ulcer: Secondary | ICD-10-CM | POA: Insufficient documentation

## 2021-05-13 DIAGNOSIS — T25222A Burn of second degree of left foot, initial encounter: Secondary | ICD-10-CM | POA: Insufficient documentation

## 2021-05-13 DIAGNOSIS — X19XXXA Contact with other heat and hot substances, initial encounter: Secondary | ICD-10-CM | POA: Diagnosis not present
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
## 2021-05-13 NOTE — Progress Notes (Signed)
NARA, PATERNOSTER ([id number]) Visit Report for 05/13/2021 Abuse/Suicide Risk Screen Details Patient Name: Date of Service: LERIN, JECH 05/13/2021 7:30 A M Medical Record Number: [id number] Patient Account Number: [id number] Date of Birth/Sex: Treating RN: November 07, 1980 (40 y.o. Wynelle Link Primary Care Ivyana Locey: [name], [name] Other Clinician: Referring Sylvanna Burggraf: Treating Chastity Noland/Extender: Jenene Slicker in Treatment: 0 Abuse/Suicide Risk Screen Items Answer ABUSE RISK SCREEN: Has anyone close to you tried to hurt or harm you recentlyo No Do you feel uncomfortable with anyone in your familyo No Has anyone forced you do things that you didnt want to doo No Electronic Signature(s) Signed: 05/13/2021 5:55:34 PM By: Zandra Abts RN, BSN Entered By: Zandra Abts on 05/13/2021 08:07:18 -------------------------------------------------------------------------------- Activities of Daily Living Details Patient Name: Date of Service: ROZENA, FIERRO 05/13/2021 7:30 A M Medical Record Number: [id number] Patient Account Number: [id number] Date of Birth/Sex: Treating RN: 24-May-1981 (40 y.o. Wynelle Link Primary Care Karalyn Kadel: [name], [name] Other Clinician: Referring Jensyn Shave: Treating Rozalyn Osland/Extender: Jenene Slicker in Treatment: 0 Activities of Daily Living Items Answer Activities of Daily Living (Please select one for each item) Drive Automobile Completely Able T Medications ake Completely Able Use T elephone Completely Able Care for Appearance Completely Able Use T oilet Completely Able Bath / Shower Completely Able Dress Self Completely Able Feed Self Completely Able Walk Completely Able Get In / Out Bed Completely Able Housework Completely Able Prepare Meals Completely Able Handle Money Completely Able Shop for Self Completely Able Electronic Signature(s) Signed: 05/13/2021 5:55:34 PM By: Zandra Abts RN,  BSN Entered By: Zandra Abts on 05/13/2021 08:07:34 -------------------------------------------------------------------------------- Education Screening Details Patient Name: Date of Service: HILDEGARDE, DUNAWAY 05/13/2021 7:30 A M Medical Record Number: [id number] Patient Account Number: [id number] Date of Birth/Sex: Treating RN: 03/16/1981 (40 y.o. Wynelle Link Primary Care Lariza Cothron: [name], [name] Other Clinician: Referring Monicka Cyran: Treating Raahi Korber/Extender: Jenene Slicker in Treatment: 0 Primary Learner Assessed: Patient Learning Preferences/Education Level/Primary Language Learning Preference: Explanation, Demonstration, Printed Material Highest Education Level: High School Preferred Language: English Cognitive Barrier Language Barrier: No Translator Needed: No Memory Deficit: No Emotional Barrier: No Cultural/Religious Beliefs Affecting Medical Care: No Physical Barrier Impaired Vision: No Impaired Hearing: No Decreased Hand dexterity: No Knowledge/Comprehension Knowledge Level: High Comprehension Level: High Ability to understand written instructions: High Ability to understand verbal instructions: High Motivation Anxiety Level: Calm Cooperation: Cooperative Education Importance: Acknowledges Need Interest in Health Problems: Asks Questions Perception: Coherent Willingness to Engage in Self-Management High Activities: Readiness to Engage in Self-Management High Activities: Electronic Signature(s) Signed: 05/13/2021 5:55:34 PM By: Zandra Abts RN, BSN Entered By: Zandra Abts on 05/13/2021 08:07:50 -------------------------------------------------------------------------------- Fall Risk Assessment Details Patient Name: Date of Service: ABBIGAL, RADICH 05/13/2021 7:30 A M Medical Record Number: [id number] Patient Account Number: [id number] Date of Birth/Sex: Treating RN: [birth year] (39 y.o. Wynelle Link Primary  Care Clearance Chenault: [name], [name] Other Clinician: Referring Rosann Gorum: Treating Midas Daughety/Extender: Jenene Slicker in Treatment: 0 Fall Risk Assessment Items Have you had 2 or more falls in the last 12 monthso 0 No Have you had any fall that resulted in injury in the last 12 monthso 0 No FALLS RISK SCREEN History of falling - immediate or within 3 months 0 No Secondary diagnosis (Do you have 2 or more medical diagnoseso) 0 No Ambulatory aid None/bed rest/wheelchair/nurse 0 Yes Crutches/cane/walker 0 No Furniture 0 No Intravenous therapy Access/Saline/Heparin Lock 0 No Gait/Transferring Normal/ bed rest/ wheelchair 0 Yes Weak (short steps with or  without shuffle, stooped but able to lift head while walking, may seek 0 No support from furniture) Impaired (short steps with shuffle, may have difficulty arising from chair, head down, impaired 0 No balance) Mental Status Oriented to own ability 0 Yes Electronic Signature(s) Signed: 05/13/2021 5:55:34 PM By: Zandra Abts RN, BSN Entered By: Zandra Abts on 05/13/2021 08:08:25 -------------------------------------------------------------------------------- Foot Assessment Details Patient Name: Date of Service: VIVIANN, BROYLES 05/13/2021 7:30 A M Medical Record Number: [id number] Patient Account Number: [id number] Date of Birth/Sex: Treating RN: 03-14-1981 (40 y.o. Wynelle Link Primary Care Cera Rorke: [name], [name] Other Clinician: Referring Brinda Focht: Treating Tarah Buboltz/Extender: Jenene Slicker in Treatment: 0 Foot Assessment Items Site Locations + = Sensation present, - = Sensation absent, C = Callus, U = Ulcer R = Redness, W = Warmth, M = Maceration, PU = Pre-ulcerative lesion F = Fissure, S = Swelling, D = Dryness Assessment Right: Left: Other Deformity: No No Prior Foot Ulcer: No No Prior Amputation: No No Charcot Joint: No No Ambulatory Status: Ambulatory Without  Help Gait: Steady Electronic Signature(s) Signed: 05/13/2021 5:55:34 PM By: Zandra Abts RN, BSN Entered By: Zandra Abts on 05/13/2021 08:15:49 -------------------------------------------------------------------------------- Nutrition Risk Screening Details Patient Name: Date of Service: SUNDAY, KLOS 05/13/2021 7:30 A M Medical Record Number: [id number] Patient Account Number: [id number] Date of Birth/Sex: Treating RN: Nov 14, 1980 (40 y.o. Wynelle Link Primary Care Savera Donson: [name], [name] Other Clinician: Referring Jahvier Aldea: Treating Uziel Covault/Extender: Jenene Slicker in Treatment: 0 Height (in): 63 Weight (lbs): 215 Body Mass Index (BMI): 38.1 Nutrition Risk Screening Items Score Screening NUTRITION RISK SCREEN: I have an illness or condition that made me change the kind and/or amount of food I eat 2 Yes I eat fewer than two meals per day 0 No I eat few fruits and vegetables, or milk products 0 No I have three or more drinks of beer, liquor or wine almost every day 0 No I have tooth or mouth problems that make it hard for me to eat 0 No I don't always have enough money to buy the food I need 0 No I eat alone most of the time 0 No I take three or more different prescribed or over-the-counter drugs a day 1 Yes Without wanting to, I have lost or gained 10 pounds in the last six months 0 No I am not always physically able to shop, cook and/or feed myself 0 No Nutrition Protocols Good Risk Protocol Moderate Risk Protocol 0 Provide education on nutrition High Risk Proctocol Risk Level: Moderate Risk Score: 3 Electronic Signature(s) Signed: 05/13/2021 5:55:34 PM By: Zandra Abts RN, BSN Entered By: Zandra Abts on 05/13/2021 08:08:56

## 2021-05-14 NOTE — Progress Notes (Signed)
OPHELIA, SIPE ([id number]) Visit Report for 05/13/2021 Allergy List Details Patient Name: Date of Service: NORELLE, RUNNION 05/13/2021 7:30 A M Medical Record Number: [id number] Patient Account Number: [id number] Date of Birth/Sex: Treating RN: 15-Apr-1981 (40 y.o. Wynelle Link Primary Care Tyteanna Ost: [name], [name] Other Clinician: Referring Chesney Suares: Treating Nissan Frazzini/Extender: Jenene Slicker in Treatment: 0 Allergies Active Allergies magnesium Shellfish Containing Products Allergy Notes Electronic Signature(s) Signed: 05/13/2021 5:55:34 PM By: Zandra Abts RN, BSN Entered By: Zandra Abts on 05/13/2021 09:11:25 -------------------------------------------------------------------------------- Arrival Information Details Patient Name: Date of Service: KATREENA, SCHUPP 05/13/2021 7:30 A M Medical Record Number: [id number] Patient Account Number: [id number] Date of Birth/Sex: Treating RN: Jul 25, 1981 (40 y.o. Wynelle Link Primary Care Klaryssa Fauth: [name], [name] Other Clinician: Referring Leshia Kope: Treating Shaunta Oncale/Extender: Jenene Slicker in Treatment: 0 Visit Information Patient Arrived: Ambulatory Arrival Time: 07:45 Accompanied By: husband Transfer Assistance: None Patient Identification Verified: Yes Secondary Verification Process Completed: Yes Patient Requires Transmission-Based Precautions: No Patient Has Alerts: Yes Patient Alerts: L ABI non compressible Electronic Signature(s) Signed: 05/13/2021 5:55:34 PM By: Zandra Abts RN, BSN Entered By: Zandra Abts on 05/13/2021 08:17:46 -------------------------------------------------------------------------------- Clinic Level of Care Assessment Details Patient Name: Date of Service: CAMORA, TREMAIN 05/13/2021 7:30 A M Medical Record Number: [id number] Patient Account Number: [id number] Date of Birth/Sex: Treating RN: [birth year] (40 y.o. Wynelle Link Primary Care Lotus Santillo: [name], [name] Other Clinician: Referring Jasmin Trumbull: Treating Kordell Jafri/Extender: Jenene Slicker in Treatment: 0 Clinic Level of Care Assessment Items TOOL 1 Quantity Score X- 1 0 Use when EandM and Procedure is performed on INITIAL visit ASSESSMENTS - Nursing Assessment / Reassessment X- 1 20 General Physical Exam (combine w/ comprehensive assessment (listed just below) when performed on new pt. evals) X- 1 25 Comprehensive Assessment (HX, ROS, Risk Assessments, Wounds Hx, etc.) ASSESSMENTS - Wound and Skin Assessment / Reassessment []  - 0 Dermatologic / Skin Assessment (not related to wound area) ASSESSMENTS - Ostomy and/or Continence Assessment and Care []  - 0 Incontinence Assessment and Management []  - 0 Ostomy Care Assessment and Management (repouching, etc.) PROCESS - Coordination of Care X - Simple Patient / Family Education for ongoing care 1 15 []  - 0 Complex (extensive) Patient / Family Education for ongoing care X- 1 10 Staff obtains , Records, T Results / Process Orders est []  - 0 Staff telephones HHA, Nursing Homes / Clarify orders / etc []  - 0 Routine Transfer to another Facility (non-emergent condition) []  - 0 Routine Hospital Admission (non-emergent condition) X- 1 15 New Admissions / / Ordering NPWT Apligraf, etc. , []  - 0 Emergency Hospital Admission (emergent condition) PROCESS - Special Needs []  - 0 Pediatric / Minor Patient Management []  - 0 Isolation Patient Management []  - 0 Hearing / Language / Visual special needs []  - 0 Assessment of Community assistance (transportation, D/C planning, etc.) []  - 0 Additional assistance / Altered mentation []  - 0 Support Surface(s) Assessment (bed, cushion, seat, etc.) INTERVENTIONS - Miscellaneous []  - 0 External ear exam []  - 0 Patient Transfer (multiple staff / / Similar devices) []  - 0 Simple Staple  / Suture removal (25 or less) []  - 0 Complex Staple / Suture removal (26 or more) []  - 0 Hypo/Hyperglycemic Management (do not check if billed separately) X- 1 15 Ankle / Brachial Index (ABI) - do not check if billed separately Has the patient been seen at the hospital within the last three years: Yes Total Score: 100  Level Of Care: New/Established - Level 3 Electronic Signature(s) Signed: 05/13/2021 5:55:34 PM By: Zandra AbtsLynch, Shatara RN, BSN Entered By: Zandra AbtsLynch, Shatara on 05/13/2021 08:44:27 -------------------------------------------------------------------------------- Encounter Discharge Information Details Patient Name: Date of Service: Nathaniel ManRO XLER, Bennett 05/13/2021 7:30 A M Medical Record Number: [id number] Patient Account Number: [id number] Date of Birth/Sex: Treating RN: 1981-04-17 (40 y.o. Wynelle LinkF) Lynch, Shatara Primary Care Camira Geidel: [name], Jomarie LongsJoseph Other Clinician: Referring Serenna Deroy: Treating Paisli Silfies/Extender: Jenene SlickerHoffman, Denina Wagoner, Matthew Weeks in Treatment: 0 Encounter Discharge Information Items Post Procedure Vitals Discharge Condition: Stable Temperature (F): 98.2 Ambulatory Status: Ambulatory Pulse (bpm): 93 Discharge Destination: Home Respiratory Rate (breaths/min): 18 Transportation: Private Auto Blood Pressure (mmHg): 159/91 Accompanied By: husband Schedule Follow-up Appointment: Yes Clinical Summary of Care: Patient Declined Electronic Signature(s) Signed: 05/13/2021 5:55:34 PM By: Zandra AbtsLynch, Shatara RN, BSN Entered By: Zandra AbtsLynch, Shatara on 05/13/2021 08:59:28 -------------------------------------------------------------------------------- Lower Extremity Assessment Details Patient Name: Date of Service: Nathaniel ManRO XLER, Reina 05/13/2021 7:30 A M Medical Record Number: [id number] Patient Account Number: [id number] Date of Birth/Sex: Treating RN: 1981-04-17 (40 y.o. Wynelle LinkF) Lynch, Shatara Primary Care Nettie Cromwell: [name], Jomarie LongsJoseph Other Clinician: Referring Shanda Cadotte: Treating  Danissa Rundle/Extender: Jenene SlickerHoffman, Kimbrely Wagoner, Matthew Weeks in Treatment: 0 Edema Assessment Assessed: [Left: No] [Right: No] Edema: [Left: Ye] [Right: s] Calf Left: Right: Point of Measurement: 30 cm From Medial Instep 51 cm Ankle Left: Right: Point of Measurement: 9 cm From Medial Instep 27.5 cm Vascular Assessment Pulses: Dorsalis Pedis Palpable: [Left:Yes] Electronic Signature(s) Signed: 05/13/2021 5:55:34 PM By: Zandra AbtsLynch, Shatara RN, BSN Entered By: Zandra AbtsLynch, Shatara on 05/13/2021 08:16:13 -------------------------------------------------------------------------------- Multi Wound Chart Details Patient Name: Date of Service: Nathaniel ManRO XLER, Elandra 05/13/2021 7:30 A M Medical Record Number: [id number] Patient Account Number: [id number] Date of Birth/Sex: Treating RN: 1981-04-17 (40 y.o. Wynelle LinkF) Lynch, Shatara Primary Care Lonny Eisen: [name], Jomarie LongsJoseph Other Clinician: Referring Berdella Bacot: Treating Brynli Ollis/Extender: Jenene SlickerHoffman, Yaileen Wagoner, Matthew Weeks in Treatment: 0 Vital Signs Height(in): 63 Pulse(bpm): 93 Weight(lbs): 215 Blood Pressure(mmHg): 159/91 Body Mass Index(BMI): 38 Temperature(F): 98.2 Respiratory Rate(breaths/min): 18 Photos: [N/A:N/A] Left, Plantar Foot N/A N/A Wound Location: Thermal Burn N/A N/A Wounding Event: Diabetic Wound/Ulcer of the Lower N/A N/A Primary Etiology: Extremity Anemia, Hypertension, Type II N/A N/A Comorbid History: Diabetes, History of Burn, Osteoarthritis, Neuropathy 03/22/2021 N/A N/A Date Acquired: 0 N/A N/A Weeks of Treatment: Open N/A N/A Wound Status: 6.9x1.5x0.1 N/A N/A Measurements L x W x D (cm) 8.129 N/A N/A A (cm) : rea 0.813 N/A N/A Volume (cm) : 0.00% N/A N/A % Reduction in A rea: 0.00% N/A N/A % Reduction in Volume: Grade 2 N/A N/A Classification: Medium N/A N/A Exudate A mount: Serosanguineous N/A N/A Exudate Type: red, brown N/A N/A Exudate Color: Flat and Intact N/A N/A Wound Margin: Large (67-100%)  N/A N/A Granulation A mount: Pink N/A N/A Granulation Quality: Small (1-33%) N/A N/A Necrotic A mount: Fat Layer (Subcutaneous Tissue): Yes N/A N/A Exposed Structures: Fascia: No Tendon: No Muscle: No Joint: No Bone: No None N/A N/A Epithelialization: Treatment Notes Electronic Signature(s) Signed: 05/13/2021 9:18:18 AM By: Geralyn CorwinHoffman, Shilpa DO Signed: 05/13/2021 5:55:34 PM By: Zandra AbtsLynch, Shatara RN, BSN Entered By: Geralyn CorwinHoffman, Marisa on 05/13/2021 08:27:02 -------------------------------------------------------------------------------- Multi-Disciplinary Care Plan Details Patient Name: Date of Service: Nathaniel ManRO XLER, Elliet 05/13/2021 7:30 A M Medical Record Number: [id number] Patient Account Number: [id number] Date of Birth/Sex: Treating RN: 1981-04-17 (40 y.o. Wynelle LinkF) Lynch, Shatara Primary Care Pasqualino Witherspoon: [name], Jomarie LongsJoseph Other Clinician: Referring Thirza Pellicano: Treating Jarome Trull/Extender: Jenene SlickerHoffman, Zanovia Wagoner, Matthew Weeks in Treatment: 0 Multidisciplinary Care Plan reviewed with physician Active Inactive Nutrition Nursing Diagnoses: Impaired glucose control:  actual or potential Potential for alteratiion in Nutrition/Potential for imbalanced nutrition Goals: Patient/caregiver agrees to and verbalizes understanding of need to use nutritional supplements and/or vitamins as prescribed Date Initiated: 05/13/2021 Target Resolution Date: 06/13/2021 Goal Status: Active Patient/caregiver will maintain therapeutic glucose control Date Initiated: 05/13/2021 Target Resolution Date: 06/13/2021 Goal Status: Active Interventions: Assess HgA1c results as ordered upon admission and as needed Assess patient nutrition upon admission and as needed per policy Provide education on elevated blood sugars and impact on wound healing Provide education on nutrition Notes: Wound/Skin Impairment Nursing Diagnoses: Impaired tissue integrity Knowledge deficit related to ulceration/compromised skin  integrity Goals: Patient/caregiver will verbalize understanding of skin care regimen Date Initiated: 05/13/2021 Target Resolution Date: 06/13/2021 Goal Status: Active Ulcer/skin breakdown will have a volume reduction of 30% by week 4 Date Initiated: 05/13/2021 Target Resolution Date: 06/13/2021 Goal Status: Active Interventions: Assess patient/caregiver ability to obtain necessary supplies Assess patient/caregiver ability to perform ulcer/skin care regimen upon admission and as needed Assess ulceration(s) every visit Provide education on ulcer and skin care Notes: Electronic Signature(s) Signed: 05/13/2021 5:55:34 PM By: Zandra Abts RN, BSN Entered By: Zandra Abts on 05/13/2021 08:40:16 -------------------------------------------------------------------------------- Pain Assessment Details Patient Name: Date of Service: CELENA, LANIUS 05/13/2021 7:30 A M Medical Record Number: [id number] Patient Account Number: [id number] Date of Birth/Sex: Treating RN: Jul 17, 1981 (40 y.o. Wynelle Link Primary Care Anitra Doxtater: [name], [name] Other Clinician: Referring Indiyah Paone: Treating Gini Caputo/Extender: Jenene Slicker in Treatment: 0 Active Problems Location of Pain Severity and Description of Pain Patient Has Paino No Site Locations Pain Management and Medication Current Pain Management: Electronic Signature(s) Signed: 05/13/2021 5:55:34 PM By: Zandra Abts RN, BSN Entered By: Zandra Abts on 05/13/2021 08:17:07 -------------------------------------------------------------------------------- Patient/Caregiver Education Details Patient Name: Date of Service: Nathaniel Man 8/23/2022andnbsp7:30 A M Medical Record Number: [id number] Patient Account Number: [id number] Date of Birth/Gender: Treating RN: 1981-09-04 (40 y.o. Wynelle Link Primary Care Physician: [name], [name] Other Clinician: Referring Physician: Treating Physician/Extender:  Jenene Slicker in Treatment: 0 Education Assessment Education Provided To: Patient Education Topics Provided Elevated Blood Sugar/ Impact on Healing: Methods: Explain/Verbal Responses: State content correctly Nutrition: Methods: Explain/Verbal Responses: State content correctly Wound/Skin Impairment: Methods: Explain/Verbal Responses: State content correctly Electronic Signature(s) Signed: 05/13/2021 5:55:34 PM By: Zandra Abts RN, BSN Entered By: Zandra Abts on 05/13/2021 08:40:29 -------------------------------------------------------------------------------- Wound Assessment Details Patient Name: Date of Service: BRIEA, MCENERY 05/13/2021 7:30 A M Medical Record Number: [id number] Patient Account Number: [id number] Date of Birth/Sex: Treating RN: [birth year] (40 y.o. Wynelle Link Primary Care Adamarys Shall: [name], [name] Other Clinician: Referring Kaliyan Osbourn: Treating Keysean Savino/Extender: Jenene Slicker in Treatment: 0 Wound Status Wound Number: 1 Primary Diabetic Wound/Ulcer of the Lower Extremity Etiology: Wound Location: Left, Plantar Foot Wound Open Wounding Event: Thermal Burn Status: Date Acquired: 03/22/2021 Comorbid Anemia, Hypertension, Type II Diabetes, History of Burn, Weeks Of Treatment: 0 History: Osteoarthritis, Neuropathy Clustered Wound: No Photos Wound Measurements Length: (cm) 6.9 Width: (cm) 1.5 Depth: (cm) 0.1 Area: (cm) 8.129 Volume: (cm) 0.813 % Reduction in Area: 0% % Reduction in Volume: 0% Epithelialization: None Tunneling: No Undermining: No Wound Description Classification: Grade 2 Wound Margin: Flat and Intact Exudate Amount: Medium Exudate Type: Serosanguineous Exudate Color: red, brown Foul Odor After Cleansing: No Slough/Fibrino Yes Wound Bed Granulation Amount: Large (67-100%) Exposed Structure Granulation Quality: Pink Fascia Exposed: No Necrotic Amount: Small  (1-33%) Fat Layer (Subcutaneous Tissue) Exposed: Yes Necrotic Quality: Adherent Slough Tendon Exposed: No Muscle Exposed: No Joint Exposed:  No Bone Exposed: No Treatment Notes Wound #1 (Foot) Wound Laterality: Plantar, Left Cleanser Soap and Water Discharge Instruction: May shower and wash wound with dial antibacterial soap and water prior to dressing change. Peri-Wound Care Sween Lotion (Moisturizing lotion) Discharge Instruction: Apply moisturizing lotion as directed Topical Primary Dressing Xeroform Occlusive Gauze Dressing, 4x4 in Discharge Instruction: Apply to wound bed as instructed Secondary Dressing Woven Gauze Sponge, Non-Sterile 4x4 in Discharge Instruction: Apply over primary dressing as directed. Secured With [employer], 4.5x3.1 (in/yd) Discharge Instruction: Secure with Kerlix as directed. Paper Tape, 2x10 (in/yd) Discharge Instruction: Secure dressing with tape as directed. Compression Wrap Compression Stockings Add-Ons Electronic Signature(s) Signed: 05/13/2021 5:55:34 PM By: Zandra Abts RN, BSN Entered By: Zandra Abts on 05/13/2021 08:16:55 -------------------------------------------------------------------------------- Vitals Details Patient Name: Date of Service: CHANCI, OJALA 05/13/2021 7:30 A M Medical Record Number: [id number] Patient Account Number: [id number] Date of Birth/Sex: Treating RN: December 15, 1980 (40 y.o. Wynelle Link Primary Care Cicilia Clinger: [name], [name] Other Clinician: Referring Idrees Quam: Treating Viney Acocella/Extender: Jenene Slicker in Treatment: 0 Vital Signs Time Taken: 07:48 Temperature (F): 98.2 Height (in): 63 Pulse (bpm): 93 Source: Stated Respiratory Rate (breaths/min): 18 Weight (lbs): 215 Blood Pressure (mmHg): 159/91 Source: Stated Reference Range: 80 - 120 mg / dl Body Mass Index (BMI): 38.1 Electronic Signature(s) Signed: 05/14/2021 9:12:52 AM By: Karl Ito Entered By: Karl Ito on 05/13/2021 07:48:33

## 2021-05-19 ENCOUNTER — Other Ambulatory Visit: Payer: Self-pay

## 2021-05-19 ENCOUNTER — Emergency Department (HOSPITAL_COMMUNITY): Payer: BC Managed Care – PPO

## 2021-05-19 ENCOUNTER — Encounter (HOSPITAL_COMMUNITY): Payer: Self-pay

## 2021-05-19 ENCOUNTER — Emergency Department (HOSPITAL_COMMUNITY)
Admission: EM | Admit: 2021-05-19 | Discharge: 2021-05-20 | Disposition: A | Discharge status: Home / Self Care | Payer: BC Managed Care – PPO | Source: Home / Self Care | Attending: Emergency Medicine | Admitting: Emergency Medicine

## 2021-05-19 DIAGNOSIS — Z20822 Contact with and (suspected) exposure to covid-19: Secondary | ICD-10-CM | POA: Insufficient documentation

## 2021-05-19 DIAGNOSIS — E1140 Type 2 diabetes mellitus with diabetic neuropathy, unspecified: Secondary | ICD-10-CM | POA: Insufficient documentation

## 2021-05-19 DIAGNOSIS — E1151 Type 2 diabetes mellitus with diabetic peripheral angiopathy without gangrene: Secondary | ICD-10-CM | POA: Insufficient documentation

## 2021-05-19 DIAGNOSIS — R0602 Shortness of breath: Secondary | ICD-10-CM | POA: Insufficient documentation

## 2021-05-19 DIAGNOSIS — X58XXXA Exposure to other specified factors, initial encounter: Secondary | ICD-10-CM | POA: Insufficient documentation

## 2021-05-19 DIAGNOSIS — R600 Localized edema: Secondary | ICD-10-CM | POA: Insufficient documentation

## 2021-05-19 DIAGNOSIS — Y929 Unspecified place or not applicable: Secondary | ICD-10-CM | POA: Insufficient documentation

## 2021-05-19 DIAGNOSIS — Z794 Long term (current) use of insulin: Secondary | ICD-10-CM | POA: Insufficient documentation

## 2021-05-19 DIAGNOSIS — R059 Cough, unspecified: Secondary | ICD-10-CM | POA: Insufficient documentation

## 2021-05-19 DIAGNOSIS — E11621 Type 2 diabetes mellitus with foot ulcer: Secondary | ICD-10-CM | POA: Insufficient documentation

## 2021-05-19 DIAGNOSIS — E1142 Type 2 diabetes mellitus with diabetic polyneuropathy: Secondary | ICD-10-CM | POA: Insufficient documentation

## 2021-05-19 DIAGNOSIS — T25022A Burn of unspecified degree of left foot, initial encounter: Secondary | ICD-10-CM | POA: Insufficient documentation

## 2021-05-19 DIAGNOSIS — R609 Edema, unspecified: Secondary | ICD-10-CM

## 2021-05-19 LAB — CBC WITH DIFFERENTIAL/PLATELET
Abs Immature Granulocytes: 0.01 10*3/uL (ref 0.00–0.07)
Basophils Absolute: 0 10*3/uL (ref 0.0–0.1)
Basophils Relative: 1 %
Eosinophils Absolute: 0.3 10*3/uL (ref 0.0–0.5)
Eosinophils Relative: 6 %
HCT: 24.2 % — ABNORMAL LOW (ref 36.0–46.0)
Hemoglobin: 8 g/dL — ABNORMAL LOW (ref 12.0–15.0)
Immature Granulocytes: 0 %
Lymphocytes Relative: 32 %
Lymphs Abs: 1.7 10*3/uL (ref 0.7–4.0)
MCH: 27.2 pg (ref 26.0–34.0)
MCHC: 33.1 g/dL (ref 30.0–36.0)
MCV: 82.3 fL (ref 80.0–100.0)
Monocytes Absolute: 0.4 10*3/uL (ref 0.1–1.0)
Monocytes Relative: 8 %
Neutro Abs: 2.9 10*3/uL (ref 1.7–7.7)
Neutrophils Relative %: 53 %
Platelets: 241 10*3/uL (ref 150–400)
RBC: 2.94 MIL/uL — ABNORMAL LOW (ref 3.87–5.11)
RDW: 16.5 % — ABNORMAL HIGH (ref 11.5–15.5)
WBC: 5.4 10*3/uL (ref 4.0–10.5)
nRBC: 0 % (ref 0.0–0.2)

## 2021-05-19 LAB — COMPREHENSIVE METABOLIC PANEL
ALT: 13 U/L (ref 0–44)
AST: 16 U/L (ref 15–41)
Albumin: 2.6 g/dL — ABNORMAL LOW (ref 3.5–5.0)
Alkaline Phosphatase: 65 U/L (ref 38–126)
Anion gap: 6 (ref 5–15)
BUN: 17 mg/dL (ref 6–20)
CO2: 25 mmol/L (ref 22–32)
Calcium: 8.4 mg/dL — ABNORMAL LOW (ref 8.9–10.3)
Chloride: 110 mmol/L (ref 98–111)
Creatinine, Ser: 0.99 mg/dL (ref 0.44–1.00)
GFR, Estimated: >60 mL/min (ref >60)
Glucose, Bld: 142 mg/dL — ABNORMAL HIGH (ref 70–99)
Potassium: 4.1 mmol/L (ref 3.5–5.1)
Sodium: 141 mmol/L (ref 135–145)
Total Bilirubin: 0.5 mg/dL (ref 0.3–1.2)
Total Protein: 6.3 g/dL — ABNORMAL LOW (ref 6.5–8.1)

## 2021-05-19 LAB — RESP PANEL BY RT-PCR (FLU A&B, COVID) ARPGX2
Influenza A by PCR: NEGATIVE
Influenza B by PCR: NEGATIVE
SARS Coronavirus 2 by RT PCR: NEGATIVE

## 2021-05-19 LAB — TROPONIN I (HIGH SENSITIVITY): Troponin I (High Sensitivity): 5 ng/L (ref <18)

## 2021-05-19 LAB — BRAIN NATRIURETIC PEPTIDE: B Natriuretic Peptide: 200.9 pg/mL — ABNORMAL HIGH (ref 0.0–100.0)

## 2021-05-19 NOTE — ED Triage Notes (Signed)
Pt reports cough, shortness of breath, and bilateral leg swelling x 1 month. Pt states that she had PNA in the beginning of the month. Pt reports testing positive for Covid last week.

## 2021-05-19 NOTE — ED Provider Notes (Signed)
Emergency Medicine Provider Triage Evaluation Note  [name] , a 40 y.o. female  was evaluated in triage.  Pt complains of ble swelling and cough.  Review of Systems  Positive: Ble swelling, cough, sob, chest pain Negative: fevers  Physical Exam  There were no vitals taken for this visit. Gen:   Awake, no distress   Resp:  Normal effort  MSK:   Moves extremities without difficulty  Other:  Ble edema  Medical Decision Making  Medically screening exam initiated at 7:27 PM.  Appropriate orders placed.  [name] was informed that the remainder of the evaluation will be completed by another provider, this initial triage assessment does not replace that evaluation, and the importance of remaining in the ED until their evaluation is complete.     [name] 05/19/21 1929    Tegeler, Canary Brim, MD 05/20/21 [phone]

## 2021-05-20 ENCOUNTER — Emergency Department (HOSPITAL_COMMUNITY): Payer: BC Managed Care – PPO

## 2021-05-20 ENCOUNTER — Encounter (HOSPITAL_COMMUNITY): Payer: Self-pay

## 2021-05-20 ENCOUNTER — Encounter (HOSPITAL_BASED_OUTPATIENT_CLINIC_OR_DEPARTMENT_OTHER): Payer: BC Managed Care – PPO | Attending: Internal Medicine | Admitting: Internal Medicine

## 2021-05-20 DIAGNOSIS — S91302D Unspecified open wound, left foot, subsequent encounter: Secondary | ICD-10-CM | POA: Diagnosis not present

## 2021-05-20 DIAGNOSIS — T25099D Burn of unspecified degree of multiple sites of unspecified ankle and foot, subsequent encounter: Secondary | ICD-10-CM

## 2021-05-20 DIAGNOSIS — E11621 Type 2 diabetes mellitus with foot ulcer: Secondary | ICD-10-CM

## 2021-05-20 LAB — TROPONIN I (HIGH SENSITIVITY): Troponin I (High Sensitivity): 5 ng/L (ref <18)

## 2021-05-20 MED ORDER — FUROSEMIDE 10 MG/ML IJ SOLN
40.0000 mg | Freq: Once | INTRAMUSCULAR | Status: AC
  Administered 2021-05-20: 40 mg via INTRAVENOUS
  Filled 2021-05-20: qty 4

## 2021-05-20 MED ORDER — IOHEXOL 350 MG/ML SOLN
100.0000 mL | Freq: Once | INTRAVENOUS | Status: AC | PRN
  Administered 2021-05-20: 100 mL via INTRAVENOUS

## 2021-05-20 MED ORDER — FUROSEMIDE 20 MG PO TABS: 40.0000 mg | ORAL_TABLET | Freq: Every day | ORAL | 1 refills | Status: DC

## 2021-05-20 NOTE — ED Notes (Signed)
Back from Ct.
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
## 2021-05-20 NOTE — ED Notes (Signed)
Patient was incontinent to stool. Linens were changed and patient was cleaned. Patient stated she didn't know she was having a bowel movement.

## 2021-05-20 NOTE — ED Notes (Signed)
To Ct 

## 2021-05-20 NOTE — ED Provider Notes (Signed)
[facility] Provider Note   CSN: [id number] Arrival date & time: 05/19/21  1913     History Chief Complaint  Patient presents with   Cough   Shortness of Breath   Leg Swelling    [name] is a 40 y.o. female.   Cough Cough characteristics:  Non-productive Sputum characteristics:  Nondescript Severity:  Moderate Onset quality:  Gradual Timing:  Constant Chronicity:  New Relieved by:  None tried Worsened by:  Activity Ineffective treatments:  None tried Associated symptoms: shortness of breath   Associated symptoms: no chest pain, no chills and no fever   Shortness of Breath Associated symptoms: cough   Associated symptoms: no chest pain and no fever       Past Medical History:  Diagnosis Date   Anemia    Carpal tunnel syndrome    Depression    Diabetes mellitus without complication (HCC)    Diabetic neuropathy (HCC)    Hemoglobin C trait (HCC)    High cholesterol     Patient Active Problem List   Diagnosis Date Noted   Symptomatic anemia 04/01/2021    Past Surgical History:  Procedure Laterality Date   CESAREAN SECTION     EYE SURGERY Bilateral    MOUTH SURGERY     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     Family History  Problem Relation Age of Onset   Diabetes Mother    Hypertension Mother    Hyperlipidemia Mother    Cancer Father     Social History   Tobacco Use   Smoking status: Never   Smokeless tobacco: Never  Vaping Use   Vaping Use: Never used  Substance Use Topics   Alcohol use: No   Drug use: No    Home Medications Prior to Admission medications   Medication Sig Start Date End Date Taking? Authorizing Provider  amoxicillin-clavulanate (AUGMENTIN) 875-125 MG tablet Take 1 tablet by mouth 2 (two) times daily. 04/08/21   Vivi Barrack, DPM  atorvastatin (LIPITOR) 40 MG tablet Take 40 mg by mouth daily.    [provider]  baclofen (LIORESAL) 20 MG tablet Take 20 mg by mouth at bedtime.    [provider]  celecoxib (CELEBREX) 200 MG capsule Take 200 mg by mouth daily as needed for mild pain.  07/18/20   [provider]  ciprofloxacin (CIPRO) 500 MG tablet Take 1 tablet (500 mg total) by mouth 2 (two) times daily. 04/17/21   Vivi Barrack, DPM  empagliflozin (JARDIANCE) 25 MG TABS tablet Take 25 mg by mouth daily.    [provider]  famotidine (PEPCID) 20 MG tablet Take 20 mg by mouth 2 (two) times daily as needed for heartburn or indigestion.    [provider]  Ferrous Sulfate (IRON) 325 (65 Fe) MG TABS Take 2 tablets every other day. 03/26/21   Molpus, John, MD  furosemide (LASIX) 40 MG tablet Take 40 mg by mouth daily.    [provider]  insulin degludec (TRESIBA) 100 UNIT/ML FlexTouch Pen Inject 15 Units into the skin daily.    [provider]  lipase/protease/amylase (CREON) 36000 UNITS CPEP capsule Take 72,000-144,000 Units by mouth See admin instructions. Take 4 capsules (144,000 units) with meals & Take 2 capsules (72,000 units) with snacks 10/29/20   [provider]  nitrofurantoin, macrocrystal-monohydrate, (MACROBID) 100  MG capsule Take 100 mg by mouth 2 (two) times daily. 03/31/21   [provider]  ondansetron (ZOFRAN) 8 MG tablet Take 1 tablet (8 mg total) by mouth every 8 (eight) hours as needed for nausea or vomiting. 03/26/21   Molpus, John, MD  polyethylene glycol (MIRALAX / GLYCOLAX) 17 g packet Take 17 g by mouth daily as needed for mild constipation.    [provider]  pregabalin (LYRICA) 150 MG capsule Take 150 mg by mouth 2 (two) times daily. 05/07/20   [provider]  sertraline (ZOLOFT) 50 MG tablet Take 50 mg by mouth daily.    [provider]  traMADol (ULTRAM) 50 MG tablet Take 50-100 mg by mouth daily as needed for moderate pain. 12/17/20 06/15/21  [provider]   TRULICITY 1.5 MG/0.5ML SOPN Inject 1.5 mg into the skin every Sunday. 03/19/21   [provider]    Allergies    Shellfish allergy, Magnesium-containing compounds, and Tramadol  Review of Systems   Review of Systems  Constitutional:  Negative for chills and fever.  Respiratory:  Positive for cough and shortness of breath.   Cardiovascular:  Negative for chest pain.  All other systems reviewed and are negative.  Physical Exam Updated Vital Signs BP (!) 148/79   Pulse 92   Temp 98.3 F (36.8 C) (Oral)   Resp 19   SpO2 97%   Physical Exam Vitals and nursing note reviewed.  Constitutional:      Appearance: She is well-developed.  HENT:     Head: Normocephalic and atraumatic.  Cardiovascular:     Rate and Rhythm: Normal rate and regular rhythm.  Pulmonary:     Effort: No respiratory distress.     Breath sounds: No stridor. No rales.  Chest:     Chest wall: No mass or tenderness.  Abdominal:     General: There is no distension.  Musculoskeletal:        General: Normal range of motion.     Cervical back: Normal range of motion.     Right lower leg: Edema present.     Left lower leg: Edema present.  Skin:    General: Skin is warm and dry.  Neurological:     General: No focal deficit present.     Mental Status: She is alert.  Psychiatric:        Mood and Affect: Mood normal.    ED Results / Procedures / Treatments   Labs (all labs ordered are listed, but only abnormal results are displayed) Labs Reviewed  CBC WITH DIFFERENTIAL/PLATELET - Abnormal; Notable for the following components:      Result Value   RBC 2.94 (*)    Hemoglobin 8.0 (*)    HCT 24.2 (*)    RDW 16.5 (*)    All other components within normal limits  COMPREHENSIVE METABOLIC PANEL - Abnormal; Notable for the following components:   Glucose, Bld 142 (*)    Calcium 8.4 (*)    Total Protein 6.3 (*)    Albumin 2.6 (*)    All other components within normal limits  BRAIN NATRIURETIC PEPTIDE  - Abnormal; Notable for the following components:   B Natriuretic Peptide 200.9 (*)    All other components within normal limits  RESP PANEL BY RT-PCR (FLU A&B, COVID) ARPGX2  TROPONIN I (HIGH SENSITIVITY)  TROPONIN I (HIGH SENSITIVITY)    EKG None  Radiology DG Chest 2 View  Result Date: 05/19/2021 CLINICAL DATA:  Bilateral  lower extremity edema. EXAM: CHEST - 2 VIEW COMPARISON:  Chest radiograph dated 03/31/2021 FINDINGS: Cardiomegaly with mild central vascular congestion. No focal consolidation, or pneumothorax. Trace left pleural effusion may be present. No acute osseous pathology. IMPRESSION: Cardiomegaly with mild central vascular congestion. Electronically Signed   By: Elgie Collard M.D.   On: 05/19/2021 19:54   CT Angio Chest PE W and/or Wo Contrast  Result Date: 05/20/2021 CLINICAL DATA:  Shortness of breath EXAM: CT ANGIOGRAPHY CHEST WITH CONTRAST TECHNIQUE: Multidetector CT imaging of the chest was performed using the standard protocol during bolus administration of intravenous contrast. Multiplanar CT image reconstructions and MIPs were obtained to evaluate the vascular anatomy. CONTRAST:  OMNIPAQUE IOHEXOL 350 MG/ML SOLN COMPARISON:  None. FINDINGS: Cardiovascular: Contrast injection is sufficient to demonstrate satisfactory opacification of the pulmonary arteries to the segmental level. There is no pulmonary embolus or evidence of right heart strain. The size of the main pulmonary artery is normal. Heart size is normal, with no pericardial effusion. The course and caliber of the aorta are normal. There is no atherosclerotic calcification. Opacification decreased due to pulmonary arterial phase contrast bolus timing. Mediastinum/Nodes: No mediastinal, hilar or axillary lymphadenopathy. Normal visualized thyroid. Thoracic esophageal course is normal. Lungs/Pleura: Bilateral peribronchial thickening with mild ground-glass opacity. Upper Abdomen: Contrast bolus timing is not  optimized for evaluation of the abdominal organs. The visualized portions of the organs of the upper abdomen are normal. Musculoskeletal: No chest wall abnormality. No bony spinal canal stenosis. Review of the MIP images confirms the above findings. IMPRESSION: 1. No pulmonary embolus or acute aortic syndrome. 2. Bilateral peribronchial thickening and mild ground-glass opacity, which could indicate pulmonary edema. Electronically Signed   By: Deatra Robinson M.D.   On: 05/20/2021 03:33    Procedures Procedures   Medications Ordered in ED Medications  furosemide (LASIX) injection 40 mg (40 mg Intravenous Given 05/20/21 0220)  iohexol (OMNIPAQUE) 350 MG/ML injection 100 mL (100 mLs Intravenous Contrast Given 05/20/21 0307)    ED Course  I have reviewed the triage vital signs and the nursing notes.  Pertinent labs & imaging results that were available during my care of the patient were reviewed by me and considered in my medical decision making (see chart for details).    MDM Rules/Calculators/A&P                           Peripheral edema, elevated BNP. Plan for lasix. No resp distress or significant HTN to suggest severe disease requiring hospitalization or further workup. Stable for outpatient management.   Final Clinical Impression(s) / ED Diagnoses Final diagnoses:  Peripheral edema    Rx / DC Orders ED Discharge Orders     None        Skilar Marcou, Barbara Cower, MD 05/22/21 [phone]
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
## 2021-06-03 ENCOUNTER — Encounter (HOSPITAL_BASED_OUTPATIENT_CLINIC_OR_DEPARTMENT_OTHER): Payer: BC Managed Care – PPO | Attending: Internal Medicine | Admitting: Internal Medicine

## 2021-06-03 ENCOUNTER — Other Ambulatory Visit: Payer: Self-pay

## 2021-06-03 DIAGNOSIS — S91302D Unspecified open wound, left foot, subsequent encounter: Secondary | ICD-10-CM | POA: Diagnosis not present

## 2021-06-03 DIAGNOSIS — E11621 Type 2 diabetes mellitus with foot ulcer: Secondary | ICD-10-CM | POA: Insufficient documentation

## 2021-06-03 DIAGNOSIS — S91302A Unspecified open wound, left foot, initial encounter: Secondary | ICD-10-CM

## 2021-06-03 DIAGNOSIS — T25099D Burn of unspecified degree of multiple sites of unspecified ankle and foot, subsequent encounter: Secondary | ICD-10-CM | POA: Diagnosis not present

## 2021-06-03 DIAGNOSIS — X58XXXD Exposure to other specified factors, subsequent encounter: Secondary | ICD-10-CM | POA: Insufficient documentation

## 2021-06-03 DIAGNOSIS — E1142 Type 2 diabetes mellitus with diabetic polyneuropathy: Secondary | ICD-10-CM | POA: Insufficient documentation

## 2021-06-03 DIAGNOSIS — S91105D Unspecified open wound of left lesser toe(s) without damage to nail, subsequent encounter: Secondary | ICD-10-CM | POA: Diagnosis not present
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
## 2021-06-03 NOTE — Progress Notes (Signed)
Nicole, Dunlap ([id number]) Visit Report for 06/03/2021 Arrival Information Details Patient Name: Date of Service: Nicole, Dunlap 06/03/2021 8:15 A M Medical Record Number: [id number] Patient Account Number: [id number] Date of Birth/Sex: Treating RN: 07/14/1981 (40 y.o. Billy Coast, Linda Primary Care Law Corsino: [name], [name] Other Clinician: Referring Normagene Harvie: Treating Chandon Lazcano/Extender: [name] [name], [name] in Treatment: 3 Visit Information History Since Last Visit Added or deleted any medications: No Patient Arrived: Ambulatory Any new allergies or adverse reactions: No Arrival Time: 08:26 Had a fall or experienced change in No Accompanied By: spouse activities of daily living that may affect Transfer Assistance: None risk of falls: Patient Identification Verified: Yes Signs or symptoms of abuse/neglect since last visito No Secondary Verification Process Completed: Yes Hospitalized since last visit: No Patient Requires Transmission-Based Precautions: No Implantable device outside of the clinic excluding No Patient Has Alerts: Yes cellular tissue based products placed in the center Patient Alerts: L ABI non compressible since last visit: Has Dressing in Place as Prescribed: Yes Pain Present Now: No Electronic Signature(s) Signed: 06/03/2021 4:44:52 PM By: Zenaida Deed RN, BSN Entered By: Zenaida Deed on 06/03/2021 08:27:06 -------------------------------------------------------------------------------- Clinic Level of Care Assessment Details Patient Name: Date of Service: Nicole, Dunlap 06/03/2021 8:15 A M Medical Record Number: [id number] Patient Account Number: [id number] Date of Birth/Sex: Treating RN: Jul 14, 1981 (40 y.o. Tommye Standard Primary Care Yazid Pop: [name], [name] Other Clinician: Referring Khadeejah Castner: Treating Rhyen Mazariego/Extender: [name] [name], [name] in Treatment: 3 Clinic Level of Care Assessment Items TOOL  4 Quantity Score []  - 0 Use when only an EandM is performed on FOLLOW-UP visit ASSESSMENTS - Nursing Assessment / Reassessment X- 1 10 Reassessment of Co-morbidities (includes updates in patient status) X- 1 5 Reassessment of Adherence to Treatment Plan ASSESSMENTS - Wound and Skin A ssessment / Reassessment []  - 0 Simple Wound Assessment / Reassessment - one wound X- 2 5 Complex Wound Assessment / Reassessment - multiple wounds []  - 0 Dermatologic / Skin Assessment (not related to wound area) ASSESSMENTS - Focused Assessment X- 1 5 Circumferential Edema Measurements - multi extremities []  - 0 Nutritional Assessment / Counseling / Intervention X- 1 5 Lower Extremity Assessment (monofilament, tuning fork, pulses) []  - 0 Peripheral Arterial Disease Assessment (using hand held doppler) ASSESSMENTS - Ostomy and/or Continence Assessment and Care []  - 0 Incontinence Assessment and Management []  - 0 Ostomy Care Assessment and Management (repouching, etc.) PROCESS - Coordination of Care X - Simple Patient / Family Education for ongoing care 1 15 []  - 0 Complex (extensive) Patient / Family Education for ongoing care X- 1 10 Staff obtains , Records, T Results / Process Orders est []  - 0 Staff telephones HHA, Nursing Homes / Clarify orders / etc []  - 0 Routine Transfer to another Facility (non-emergent condition) []  - 0 Routine Hospital Admission (non-emergent condition) []  - 0 New Admissions / / Ordering NPWT Apligraf, etc. , []  - 0 Emergency Hospital Admission (emergent condition) X- 1 10 Simple Discharge Coordination []  - 0 Complex (extensive) Discharge Coordination PROCESS - Special Needs []  - 0 Pediatric / Minor Patient Management []  - 0 Isolation Patient Management []  - 0 Hearing / Language / Visual special needs []  - 0 Assessment of Community assistance (transportation, D/C planning, etc.) []  - 0 Additional assistance / Altered  mentation []  - 0 Support Surface(s) Assessment (bed, cushion, seat, etc.) INTERVENTIONS - Wound Cleansing / Measurement []  - 0 Simple Wound Cleansing - one wound X- 2 5 Complex Wound Cleansing -  multiple wounds X- 1 5 Wound Imaging (photographs - any number of wounds) []  - 0 Wound Tracing (instead of photographs) []  - 0 Simple Wound Measurement - one wound X- 2 5 Complex Wound Measurement - multiple wounds INTERVENTIONS - Wound Dressings X - Small Wound Dressing one or multiple wounds 1 10 []  - 0 Medium Wound Dressing one or multiple wounds []  - 0 Large Wound Dressing one or multiple wounds []  - 0 Application of Medications - topical []  - 0 Application of Medications - injection INTERVENTIONS - Miscellaneous []  - 0 External ear exam []  - 0 Specimen Collection (cultures, biopsies, blood, body fluids, etc.) []  - 0 Specimen(s) / Culture(s) sent or taken to Lab for analysis []  - 0 Patient Transfer (multiple staff / Nurse, adult / Similar devices) []  - 0 Simple Staple / Suture removal (25 or less) []  - 0 Complex Staple / Suture removal (26 or more) []  - 0 Hypo / Hyperglycemic Management (close monitor of Blood Glucose) []  - 0 Ankle / Brachial Index (ABI) - do not check if billed separately X- 1 5 Vital Signs Has the patient been seen at the hospital within the last three years: Yes Total Score: 110 Level Of Care: New/Established - Level 3 Electronic Signature(s) Signed: 06/03/2021 4:44:52 PM By: Zenaida Deed RN, BSN Entered By: Zenaida Deed on 06/03/2021 09:06:21 -------------------------------------------------------------------------------- Encounter Discharge Information Details Patient Name: Date of Service: [name], [name] 06/03/2021 8:15 A M Medical Record Number: [id number] Patient Account Number: [id number] Date of Birth/Sex: Treating RN: 1980-12-13 (40 y.o. Tommye Standard Primary Care Quadasia Newsham: [name], [name] Other Clinician: Referring  Quadasia Newsham: Treating Blakelynn Scheeler/Extender: [name] [name], [name] in Treatment: 3 Encounter Discharge Information Items Discharge Condition: Stable Ambulatory Status: Ambulatory Discharge Destination: Home Transportation: Private Auto Accompanied By: self Schedule Follow-up Appointment: Yes Clinical Summary of Care: Patient Declined Electronic Signature(s) Signed: 06/03/2021 4:44:52 PM By: Zenaida Deed RN, BSN Entered By: Zenaida Deed on 06/03/2021 09:07:04 -------------------------------------------------------------------------------- Lower Extremity Assessment Details Patient Name: Date of Service: RAYSA, COFRANCESCO 06/03/2021 8:15 A M Medical Record Number: [id number] Patient Account Number: [id number] Date of Birth/Sex: Treating RN: May 15, 1981 (40 y.o. Tommye Standard Primary Care Jannett Schmall: [name], [name] Other Clinician: Referring Mamoudou Mulvehill: Treating Skylier Kretschmer/Extender: [name] [name], [name] in Treatment: 3 Edema Assessment Assessed: [Left: No] [Right: No] Edema: [Left: Ye] [Right: s] Calf Left: Right: Point of Measurement: 30 cm From Medial Instep 49.5 cm Ankle Left: Right: Point of Measurement: 9 cm From Medial Instep 29 cm Vascular Assessment Pulses: Dorsalis Pedis Palpable: [Left:Yes] Electronic Signature(s) Signed: 06/03/2021 4:44:52 PM By: Zenaida Deed RN, BSN Entered By: Zenaida Deed on 06/03/2021 08:37:32 -------------------------------------------------------------------------------- Multi Wound Chart Details Patient Name: Date of Service: Nathaniel Man 06/03/2021 8:15 A M Medical Record Number: [id number] Patient Account Number: [id number] Date of Birth/Sex: Treating RN: 30-Aug-1981 (40 y.o. Arta Silence Primary Care Elvera Almario: [name], [name] Other Clinician: Referring Jabrea Kallstrom: Treating Traven Davids/Extender: [name] [name], [name] in Treatment: 3 Vital Signs Height(in): 63 Pulse(bpm):  89 Weight(lbs): 215 Blood Pressure(mmHg): 162/93 Body Mass Index(BMI): 38 Temperature(F): 98.4 Respiratory Rate(breaths/min): 18 Photos: [N/A:N/A] Left, Plantar Foot Left T - Web between 4th and 5th N/A oe Wound Location: Thermal Burn Blister N/A Wounding Event: Diabetic Wound/Ulcer of the Lower Diabetic Wound/Ulcer of the Lower N/A Primary Etiology: Extremity Extremity Anemia, Hypertension, Type II Anemia, Hypertension, Type II N/A Comorbid History: Diabetes, History of Burn, Diabetes, History of Burn, Osteoarthritis, Neuropathy Osteoarthritis, Neuropathy 03/22/2021 05/31/2021 N/A Date Acquired: 3 0 N/A Weeks of  Treatment: Open Open N/A Wound Status: 3.2x0.5x0.2 0.5x1.4x0.1 N/A Measurements L x W x D (cm) 1.257 0.55 N/A A (cm) : rea 0.251 0.055 N/A Volume (cm) : 84.50% 0.00% N/A % Reduction in A rea: 69.10% 0.00% N/A % Reduction in Volume: Grade 2 Grade 1 N/A Classification: Medium Medium N/A Exudate A mount: Serosanguineous Serosanguineous N/A Exudate Type: red, brown red, brown N/A Exudate Color: Flat and Intact Distinct, outline attached N/A Wound Margin: Large (67-100%) Large (67-100%) N/A Granulation A mount: Red Red N/A Granulation Quality: None Present (0%) None Present (0%) N/A Necrotic A mount: Fat Layer (Subcutaneous Tissue): Yes Fat Layer (Subcutaneous Tissue): Yes N/A Exposed Structures: Fascia: No Fascia: No Tendon: No Tendon: No Muscle: No Muscle: No Joint: No Joint: No Bone: No Bone: No Small (1-33%) Small (1-33%) N/A Epithelialization: Treatment Notes Wound #1 (Foot) Wound Laterality: Plantar, Left Cleanser Soap and Water Discharge Instruction: May shower and wash wound with dial antibacterial soap and water prior to dressing change. Peri-Wound Care Sween Lotion (Moisturizing lotion) Discharge Instruction: Apply moisturizing lotion as directed Topical Primary Dressing KerraCel Ag Gelling Fiber Dressing, 2x2 in (silver  alginate) Discharge Instruction: Apply silver alginate to wound bed as instructed Secondary Dressing Woven Gauze Sponges 2x2 in Discharge Instruction: Apply over primary dressing as directed. Zetuvit Plus Silicone Border Dressing 4x4 (in/in) Discharge Instruction: Apply silicone border over primary dressing as directed. Secured With [employer] 4x5 (in/yd) Discharge Instruction: Secure with Coban as directed. Conforming Stretch Gauze Bandage, Sterile 2x75 (in/in) Discharge Instruction: Secure with stretch gauze as directed. Paper Tape, 2x10 (in/yd) Discharge Instruction: Secure dressing with tape as directed. Compression Wrap Compression Stockings Add-Ons Wound #2 (Toe - Web between 4th and 5th) Wound Laterality: Left Cleanser Peri-Wound Care Topical Primary Dressing KerraCel Ag Gelling Fiber Dressing, 2x2 in (silver alginate) Discharge Instruction: Apply silver alginate to wound bed as instructed Secondary Dressing Woven Gauze Sponges 2x2 in Discharge Instruction: Apply over primary dressing as directed. Secured With Conforming Stretch Gauze Bandage, Sterile 2x75 (in/in) Discharge Instruction: Secure with stretch gauze as directed. Paper Tape, 2x10 (in/yd) Discharge Instruction: Secure dressing with tape as directed. Compression Wrap Compression Stockings Add-Ons Electronic Signature(s) Signed: 06/03/2021 9:21:34 AM By: [name] DO Signed: 06/03/2021 5:09:46 PM By: Shawn Stall RN, BSN Entered By: [name] on 06/03/2021 09:15:13 -------------------------------------------------------------------------------- Multi-Disciplinary Care Plan Details Patient Name: Date of Service: MAGDALINA, WHITEHEAD 06/03/2021 8:15 A M Medical Record Number: [id number] Patient Account Number: [id number] Date of Birth/Sex: Treating RN: 1980/12/04 (40 y.o. Tommye Standard Primary Care Silver Achey: [name], [name] Other Clinician: Referring Alethia Melendrez: Treating  Demetrius Barrell/Extender: [name] [name], [name] in Treatment: 3 Multidisciplinary Care Plan reviewed with physician Active Inactive Nutrition Nursing Diagnoses: Impaired glucose control: actual or potential Potential for alteratiion in Nutrition/Potential for imbalanced nutrition Goals: Patient/caregiver agrees to and verbalizes understanding of need to use nutritional supplements and/or vitamins as prescribed Date Initiated: 05/13/2021 Target Resolution Date: 06/13/2021 Goal Status: Active Patient/caregiver will maintain therapeutic glucose control Date Initiated: 05/13/2021 Target Resolution Date: 06/13/2021 Goal Status: Active Interventions: Assess HgA1c results as ordered upon admission and as needed Assess patient nutrition upon admission and as needed per policy Provide education on elevated blood sugars and impact on wound healing Provide education on nutrition Treatment Activities: Education provided on Nutrition : 05/13/2021 Notes: Wound/Skin Impairment Nursing Diagnoses: Impaired tissue integrity Knowledge deficit related to ulceration/compromised skin integrity Goals: Patient/caregiver will verbalize understanding of skin care regimen Date Initiated: 05/13/2021 Target Resolution Date: 06/13/2021 Goal Status: Active Ulcer/skin breakdown will  have a volume reduction of 30% by week 4 Date Initiated: 05/13/2021 Target Resolution Date: 06/13/2021 Goal Status: Active Interventions: Assess patient/caregiver ability to obtain necessary supplies Assess patient/caregiver ability to perform ulcer/skin care regimen upon admission and as needed Assess ulceration(s) every visit Provide education on ulcer and skin care Notes: Electronic Signature(s) Signed: 06/03/2021 4:44:52 PM By: Zenaida Deed RN, BSN Entered By: Zenaida Deed on 06/03/2021 08:44:06 -------------------------------------------------------------------------------- Pain Assessment Details Patient  Name: Date of Service: ICEL, CASTLES 06/03/2021 8:15 A M Medical Record Number: [id number] Patient Account Number: [id number] Date of Birth/Sex: Treating RN: Nov 10, 1980 (40 y.o. Tommye Standard Primary Care Lundynn Cohoon: [name], [name] Other Clinician: Referring Linken Mcglothen: Treating Robbin Escher/Extender: [name] [name], [name] in Treatment: 3 Active Problems Location of Pain Severity and Description of Pain Patient Has Paino No Site Locations Rate the pain. Current Pain Level: 0 Pain Management and Medication Current Pain Management: Electronic Signature(s) Signed: 06/03/2021 4:44:52 PM By: Zenaida Deed RN, BSN Entered By: Zenaida Deed on 06/03/2021 08:36:51 -------------------------------------------------------------------------------- Patient/Caregiver Education Details Patient Name: Date of Service: Darl Householder, Bryann 9/13/2022andnbsp8:15 A M Medical Record Number: [id number] Patient Account Number: [id number] Date of Birth/Gender: Treating RN: Aug 13, 1981 (40 y.o. Tommye Standard Primary Care Physician: [name], [name] Other Clinician: Referring Physician: Treating Physician/Extender: [name] [name], [name] in Treatment: 3 Education Assessment Education Provided To: Patient Education Topics Provided Elevated Blood Sugar/ Impact on Healing: Methods: Explain/Verbal Responses: Reinforcements needed, State content correctly Wound/Skin Impairment: Methods: Explain/Verbal Responses: Reinforcements needed, State content correctly Electronic Signature(s) Signed: 06/03/2021 4:44:52 PM By: Zenaida Deed RN, BSN Entered By: Zenaida Deed on 06/03/2021 08:45:02 -------------------------------------------------------------------------------- Wound Assessment Details Patient Name: Date of Service: EVENY, ANASTAS 06/03/2021 8:15 A M Medical Record Number: [id number] Patient Account Number: [id number] Date of Birth/Sex: Treating  RN: [birth year] (40 y.o. Tommye Standard Primary Care Gedalia Mcmillon: [name], [name] Other Clinician: Referring Margan Elias: Treating Gwendlyon Zumbro/Extender: [name] [name], [name] in Treatment: 3 Wound Status Wound Number: 1 Primary Diabetic Wound/Ulcer of the Lower Extremity Etiology: Wound Location: Left, Plantar Foot Wound Open Wounding Event: Thermal Burn Status: Date Acquired: 03/22/2021 Comorbid Anemia, Hypertension, Type II Diabetes, History of Burn, Weeks Of Treatment: 3 History: Osteoarthritis, Neuropathy Clustered Wound: No Photos Wound Measurements Length: (cm) 3.2 Width: (cm) 0.5 Depth: (cm) 0.2 Area: (cm) 1.257 Volume: (cm) 0.251 % Reduction in Area: 84.5% % Reduction in Volume: 69.1% Epithelialization: Small (1-33%) Tunneling: No Undermining: No Wound Description Classification: Grade 2 Wound Margin: Flat and Intact Exudate Amount: Medium Exudate Type: Serosanguineous Exudate Color: red, brown Foul Odor After Cleansing: No Slough/Fibrino Yes Wound Bed Granulation Amount: Large (67-100%) Exposed Structure Granulation Quality: Red Fascia Exposed: No Necrotic Amount: None Present (0%) Fat Layer (Subcutaneous Tissue) Exposed: Yes Tendon Exposed: No Muscle Exposed: No Joint Exposed: No Bone Exposed: No Treatment Notes Wound #1 (Foot) Wound Laterality: Plantar, Left Cleanser Soap and Water Discharge Instruction: May shower and wash wound with dial antibacterial soap and water prior to dressing change. Peri-Wound Care Sween Lotion (Moisturizing lotion) Discharge Instruction: Apply moisturizing lotion as directed Topical Primary Dressing KerraCel Ag Gelling Fiber Dressing, 2x2 in (silver alginate) Discharge Instruction: Apply silver alginate to wound bed as instructed Secondary Dressing Woven Gauze Sponges 2x2 in Discharge Instruction: Apply over primary dressing as directed. Zetuvit Plus Silicone Border Dressing 4x4 (in/in) Discharge  Instruction: Apply silicone border over primary dressing as directed. Secured With [employer] 4x5 (in/yd) Discharge Instruction: Secure with Coban as directed. Conforming Stretch Gauze Bandage, Sterile 2x75 (in/in) Discharge Instruction: Secure  with stretch gauze as directed. Paper Tape, 2x10 (in/yd) Discharge Instruction: Secure dressing with tape as directed. Compression Wrap Compression Stockings Add-Ons Electronic Signature(s) Signed: 06/03/2021 4:44:52 PM By: Zenaida Deed RN, BSN Signed: 06/03/2021 5:09:46 PM By: Shawn Stall RN, BSN Entered By: Shawn Stall on 06/03/2021 08:41:11 -------------------------------------------------------------------------------- Wound Assessment Details Patient Name: Date of Service: CHRISTINA, GINTZ 06/03/2021 8:15 A M Medical Record Number: [id number] Patient Account Number: [id number] Date of Birth/Sex: Treating RN: 1981-04-09 (40 y.o. Tommye Standard Primary Care Shell Yandow: [name], [name] Other Clinician: Referring Memory Heinrichs: Treating Dent Plantz/Extender: [name] [name], [name] in Treatment: 3 Wound Status Wound Number: 2 Primary Diabetic Wound/Ulcer of the Lower Extremity Etiology: Wound Location: Left T - Web between 4th and 5th oe Wound Open Wounding Event: Blister Status: Date Acquired: 05/31/2021 Comorbid Anemia, Hypertension, Type II Diabetes, History of Burn, Weeks Of Treatment: 0 History: Osteoarthritis, Neuropathy Clustered Wound: No Photos Wound Measurements Length: (cm) 0.5 Width: (cm) 1.4 Depth: (cm) 0.1 Area: (cm) 0.55 Volume: (cm) 0.055 % Reduction in Area: 0% % Reduction in Volume: 0% Epithelialization: Small (1-33%) Tunneling: No Undermining: No Wound Description Classification: Grade 1 Wound Margin: Distinct, outline attached Exudate Amount: Medium Exudate Type: Serosanguineous Exudate Color: red, brown Foul Odor After Cleansing: No Slough/Fibrino No Wound  Bed Granulation Amount: Large (67-100%) Exposed Structure Granulation Quality: Red Fascia Exposed: No Necrotic Amount: None Present (0%) Fat Layer (Subcutaneous Tissue) Exposed: Yes Tendon Exposed: No Muscle Exposed: No Joint Exposed: No Bone Exposed: No Treatment Notes Wound #2 (Toe - Web between 4th and 5th) Wound Laterality: Left Cleanser Peri-Wound Care Topical Primary Dressing KerraCel Ag Gelling Fiber Dressing, 2x2 in (silver alginate) Discharge Instruction: Apply silver alginate to wound bed as instructed Secondary Dressing Woven Gauze Sponges 2x2 in Discharge Instruction: Apply over primary dressing as directed. Secured With Conforming Stretch Gauze Bandage, Sterile 2x75 (in/in) Discharge Instruction: Secure with stretch gauze as directed. Paper Tape, 2x10 (in/yd) Discharge Instruction: Secure dressing with tape as directed. Compression Wrap Compression Stockings Add-Ons Electronic Signature(s) Signed: 06/03/2021 4:44:52 PM By: Zenaida Deed RN, BSN Signed: 06/03/2021 5:09:46 PM By: Shawn Stall RN, BSN Entered By: Shawn Stall on 06/03/2021 08:42:09 -------------------------------------------------------------------------------- Vitals Details Patient Name: Date of Service: TINIE, MCGLOIN 06/03/2021 8:15 A M Medical Record Number: [id number] Patient Account Number: [id number] Date of Birth/Sex: Treating RN: 10-Apr-1981 (40 y.o. Tommye Standard Primary Care Mavery Milling: [name], [name] Other Clinician: Referring Leanthony Rhett: Treating Idamae Coccia/Extender: [name] [name], [name] in Treatment: 3 Vital Signs Time Taken: 08:27 Temperature (F): 98.4 Height (in): 63 Pulse (bpm): 89 Source: Stated Respiratory Rate (breaths/min): 18 Weight (lbs): 215 Blood Pressure (mmHg): 162/93 Source: Stated Reference Range: 80 - 120 mg / dl Body Mass Index (BMI): 38.1 Notes has not been checking blood sugars at home Electronic Signature(s) Signed:  06/03/2021 4:44:52 PM By: Zenaida Deed RN, BSN Entered By: Zenaida Deed on 06/03/2021 08:30:20

## 2021-06-17 ENCOUNTER — Encounter (HOSPITAL_BASED_OUTPATIENT_CLINIC_OR_DEPARTMENT_OTHER): Payer: BC Managed Care – PPO | Admitting: Internal Medicine

## 2021-06-17 ENCOUNTER — Other Ambulatory Visit: Payer: Self-pay

## 2021-06-17 DIAGNOSIS — E11621 Type 2 diabetes mellitus with foot ulcer: Secondary | ICD-10-CM

## 2021-06-17 DIAGNOSIS — T25099D Burn of unspecified degree of multiple sites of unspecified ankle and foot, subsequent encounter: Secondary | ICD-10-CM

## 2021-06-17 DIAGNOSIS — S91105D Unspecified open wound of left lesser toe(s) without damage to nail, subsequent encounter: Secondary | ICD-10-CM | POA: Diagnosis not present

## 2021-06-17 DIAGNOSIS — S91302D Unspecified open wound, left foot, subsequent encounter: Secondary | ICD-10-CM

## 2021-06-17 NOTE — Progress Notes (Signed)
[name], [name] ([id number]) Visit Report for 06/17/2021 Arrival Information Details Patient Name: Date of Service: [name], [name] 06/17/2021 8:30 A M Medical Record Number: [id number] Patient Account Number: [id number] Date of Birth/Sex: Treating RN: 29-Sep-1980 (40 y.o. [name], Millard.Loa Primary Care Hollie Wojahn: [name], [name] Other Clinician: Referring Daylynn Stumpp: Treating Terrence Pizana/Extender: [name] [name], [name] in Treatment: 5 Visit Information History Since Last Visit Added or deleted any medications: No Patient Arrived: Ambulatory Any new allergies or adverse reactions: No Arrival Time: 08:34 Had a fall or experienced change in No Accompanied By: husband activities of daily living that may affect Transfer Assistance: None risk of falls: Patient Identification Verified: Yes Signs or symptoms of abuse/neglect since last visito No Secondary Verification Process Completed: Yes Hospitalized since last visit: No Patient Requires Transmission-Based Precautions: No Implantable device outside of the clinic excluding No Patient Has Alerts: Yes cellular tissue based products placed in the center Patient Alerts: L ABI non compressible since last visit: Has Dressing in Place as Prescribed: Yes Pain Present Now: No Electronic Signature(s) Signed: 06/17/2021 12:59:59 PM By: [name] Ito Entered By: [name] Ito on 06/17/2021 08:36:02 -------------------------------------------------------------------------------- Clinic Level of Care Assessment Details Patient Name: Date of Service: [name], [name] 06/17/2021 8:30 A M Medical Record Number: [id number] Patient Account Number: [id number] Date of Birth/Sex: Treating RN: 05-26-1981 (40 y.o. [name], [name] Primary Care Brookie Wayment: [name], [name] Other Clinician: Referring Iline Buchinger: Treating Gerldine Suleiman/Extender: [name] [name], [name] in Treatment: 5 Clinic Level of Care Assessment Items TOOL 4  Quantity Score X- 1 0 Use when only an EandM is performed on FOLLOW-UP visit ASSESSMENTS - Nursing Assessment / Reassessment X- 1 10 Reassessment of Co-morbidities (includes updates in patient status) X- 1 5 Reassessment of Adherence to Treatment Plan ASSESSMENTS - Wound and Skin A ssessment / Reassessment []  - 0 Simple Wound Assessment / Reassessment - one wound X- 2 5 Complex Wound Assessment / Reassessment - multiple wounds X- 1 10 Dermatologic / Skin Assessment (not related to wound area) ASSESSMENTS - Focused Assessment X- 1 5 Circumferential Edema Measurements - multi extremities X- 1 10 Nutritional Assessment / Counseling / Intervention []  - 0 Lower Extremity Assessment (monofilament, tuning fork, pulses) []  - 0 Peripheral Arterial Disease Assessment (using hand held doppler) ASSESSMENTS - Ostomy and/or Continence Assessment and Care []  - 0 Incontinence Assessment and Management []  - 0 Ostomy Care Assessment and Management (repouching, etc.) PROCESS - Coordination of Care []  - 0 Simple Patient / Family Education for ongoing care X- 1 20 Complex (extensive) Patient / Family Education for ongoing care X- 1 10 Staff obtains , Records, T Results / Process Orders est []  - 0 Staff telephones HHA, Nursing Homes / Clarify orders / etc []  - 0 Routine Transfer to another Facility (non-emergent condition) []  - 0 Routine Hospital Admission (non-emergent condition) []  - 0 New Admissions / / Ordering NPWT Apligraf, etc. , []  - 0 Emergency Hospital Admission (emergent condition) []  - 0 Simple Discharge Coordination X- 1 15 Complex (extensive) Discharge Coordination PROCESS - Special Needs []  - 0 Pediatric / Minor Patient Management []  - 0 Isolation Patient Management []  - 0 Hearing / Language / Visual special needs []  - 0 Assessment of Community assistance (transportation, D/C planning, etc.) []  - 0 Additional assistance / Altered  mentation []  - 0 Support Surface(s) Assessment (bed, cushion, seat, etc.) INTERVENTIONS - Wound Cleansing / Measurement []  - 0 Simple Wound Cleansing - one wound X- 2 5 Complex Wound Cleansing - multiple wounds  X- 1 5 Wound Imaging (photographs - any number of wounds) []  - 0 Wound Tracing (instead of photographs) []  - 0 Simple Wound Measurement - one wound X- 2 5 Complex Wound Measurement - multiple wounds INTERVENTIONS - Wound Dressings []  - 0 Small Wound Dressing one or multiple wounds []  - 0 Medium Wound Dressing one or multiple wounds []  - 0 Large Wound Dressing one or multiple wounds []  - 0 Application of Medications - topical []  - 0 Application of Medications - injection INTERVENTIONS - Miscellaneous []  - 0 External ear exam []  - 0 Specimen Collection (cultures, biopsies, blood, body fluids, etc.) []  - 0 Specimen(s) / Culture(s) sent or taken to Lab for analysis []  - 0 Patient Transfer (multiple staff / / Similar devices) []  - 0 Simple Staple / Suture removal (25 or less) []  - 0 Complex Staple / Suture removal (26 or more) []  - 0 Hypo / Hyperglycemic Management (close monitor of Blood Glucose) []  - 0 Ankle / Brachial Index (ABI) - do not check if billed separately X- 1 5 Vital Signs Has the patient been seen at the hospital within the last three years: Yes Total Score: 125 Level Of Care: New/Established - Level 4 Electronic Signature(s) Signed: 06/17/2021 5:46:45 PM By: RN, BSN Entered By: on 06/17/2021 09:14:28 -------------------------------------------------------------------------------- Encounter Discharge Information Details Patient Name: Date of Service: [name], [name] 06/17/2021 8:30 A M Medical Record Number: Patient Account Number: Date of Birth/Sex: Treating RN: [birth year] (40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, adult, Primary Care Dossie Swor: [name], Other Clinician: Referring  Kenzey Birkland: Treating Aldrich Lloyd/Extender: [name], in Treatment: 5 Encounter Discharge Information Items Discharge Condition: Stable Ambulatory Status: Ambulatory Discharge Destination: Home Transportation: Private Auto Accompanied By: husband Schedule Follow-up Appointment: Yes Clinical Summary of Care: Electronic Signature(s) Signed: 06/17/2021 5:46:45 PM By: Shawn Stall RN, BSN Entered By: Shawn Stall on 06/17/2021 09:14:50 -------------------------------------------------------------------------------- Lower Extremity Assessment Details Patient Name: Date of Service: [name], [name] 06/17/2021 8:30 A M Medical Record Number: [id number] Patient Account Number: [id number] Date of Birth/Sex: Treating RN: 1981-08-06 (40 y.o. [name], [name] Primary Care Magdalyn Arenivas: [name], [name] Other Clinician: Referring Adelyne Marchese: Treating Jakob Kimberlin/Extender: [name] [name], [name] in Treatment: 5 Edema Assessment Assessed: [Left: Yes] [Right: No] Edema: [Left: Ye] [Right: s] Calf Left: Right: Point of Measurement: 30 cm From Medial Instep 46.5 cm Ankle Left: Right: Point of Measurement: 9 cm From Medial Instep 29 cm Vascular Assessment Pulses: Dorsalis Pedis Palpable: [Left:Yes] Electronic Signature(s) Signed: 06/17/2021 5:46:45 PM By: Shawn Stall RN, BSN Entered By: Shawn Stall on 06/17/2021 09:11:39 -------------------------------------------------------------------------------- Multi Wound Chart Details Patient Name: Date of Service: [name] 06/17/2021 8:30 A M Medical Record Number: [id number] Patient Account Number: [id number] Date of Birth/Sex: Treating RN: 12-29-1980 (40 y.o. [name] Primary Care Valaree Fresquez: [name], [name] Other Clinician: Referring Mystic Labo: Treating Doretha Goding/Extender: [name] [name], [name] in Treatment: 5 Vital Signs Height(in): 63 Pulse(bpm): 80 Weight(lbs): 215 Blood  Pressure(mmHg): 160/93 Body Mass Index(BMI): 38 Temperature(F): 98.2 Respiratory Rate(breaths/min): 18 Photos: [N/A:N/A] Left, Plantar Foot Left T - Web between 4th and 5th N/A oe Wound Location: Thermal Burn Blister N/A Wounding Event: Diabetic Wound/Ulcer of the Lower Diabetic Wound/Ulcer of the Lower N/A Primary Etiology: Extremity Extremity Anemia, Hypertension, Type II Anemia, Hypertension, Type II N/A Comorbid History: Diabetes, History of Burn, Diabetes, History of Burn, Osteoarthritis, Neuropathy Osteoarthritis, Neuropathy 03/22/2021 05/31/2021 N/A Date Acquired: 5 2 N/A Weeks of Treatment: Healed - Epithelialized Healed -  Epithelialized N/A Wound Status: 0x0x0 0x0x0 N/A Measurements L x W x D (cm) 0 0 N/A A (cm) : rea 0 0 N/A Volume (cm) : 100.00% 100.00% N/A % Reduction in A rea: 100.00% 100.00% N/A % Reduction in Volume: Grade 2 Grade 1 N/A Classification: None Present None Present N/A Exudate A mount: Flat and Intact Distinct, outline attached N/A Wound Margin: None Present (0%) Large (67-100%) N/A Granulation A mount: N/A Red N/A Granulation Quality: None Present (0%) None Present (0%) N/A Necrotic A mount: Fat Layer (Subcutaneous Tissue): Yes Fat Layer (Subcutaneous Tissue): Yes N/A Exposed Structures: Fascia: No Fascia: No Tendon: No Tendon: No Muscle: No Muscle: No Joint: No Joint: No Bone: No Bone: No Small (1-33%) Small (1-33%) N/A Epithelialization: Treatment Notes Electronic Signature(s) Signed: 06/17/2021 9:23:39 AM By: [name] DO Signed: 06/17/2021 5:46:45 PM By: Shawn Stall RN, BSN Entered By: [name] on 06/17/2021 09:19:05 -------------------------------------------------------------------------------- Multi-Disciplinary Care Plan Details Patient Name: Date of Service: [name], [name] 06/17/2021 8:30 A M Medical Record Number: [id number] Patient Account Number: [id number] Date of Birth/Sex: Treating  RN: 08/26/1981 (40 y.o. [name], [name] Primary Care Marsden Zaino: [name], [name] Other Clinician: Referring Elizandro Laura: Treating Alanie Syler/Extender: [name] [name], [name] in Treatment: 5 Multidisciplinary Care Plan reviewed with physician Active Inactive Electronic Signature(s) Signed: 06/17/2021 5:46:45 PM By: Shawn Stall RN, BSN Entered By: Shawn Stall on 06/17/2021 09:13:40 -------------------------------------------------------------------------------- Pain Assessment Details Patient Name: Date of Service: [name], [name] 06/17/2021 8:30 A M Medical Record Number: [id number] Patient Account Number: [id number] Date of Birth/Sex: Treating RN: 14-Aug-1981 (40 y.o. [name], [name] Primary Care Jacquelina Hewins: [name], [name] Other Clinician: Referring Kaysa Roulhac: Treating Ahmadou Bolz/Extender: [name] [name], [name] in Treatment: 5 Active Problems Location of Pain Severity and Description of Pain Patient Has Paino No Site Locations Pain Management and Medication Current Pain Management: Electronic Signature(s) Signed: 06/17/2021 12:59:59 PM By: [name] Ito Signed: 06/17/2021 5:46:45 PM By: Shawn Stall RN, BSN Entered By: [name] Ito on 06/17/2021 08:36:47 -------------------------------------------------------------------------------- Patient/Caregiver Education Details Patient Name: Date of Service: [name] 9/27/2022andnbsp8:30 A M Medical Record Number: [id number] Patient Account Number: [id number] Date of Birth/Gender: Treating RN: May 08, 1981 (40 y.o. [name], [name] Primary Care Physician: [name], [name] Other Clinician: Referring Physician: Treating Physician/Extender: [name] [name], [name] in Treatment: 5 Education Assessment Education Provided To: Patient Education Topics Provided Wound/Skin Impairment: Handouts: Skin Care Do's and Dont's Methods: Explain/Verbal Responses: Reinforcements  needed Electronic Signature(s) Signed: 06/17/2021 5:46:45 PM By: Shawn Stall RN, BSN Entered By: Shawn Stall on 06/17/2021 09:13:51 -------------------------------------------------------------------------------- Wound Assessment Details Patient Name: Date of Service: [name], [name] 06/17/2021 8:30 A M Medical Record Number: [id number] Patient Account Number: [id number] Date of Birth/Sex: Treating RN: Mar 23, 1981 (40 y.o. [name], [name] Primary Care Lesli Issa: [name], [name] Other Clinician: Referring Flecia Shutter: Treating Kerrington Sova/Extender: [name] [name], [name] in Treatment: 5 Wound Status Wound Number: 1 Primary Diabetic Wound/Ulcer of the Lower Extremity Etiology: Wound Location: Left, Plantar Foot Wound Healed - Epithelialized Wounding Event: Thermal Burn Status: Date Acquired: 03/22/2021 Comorbid Anemia, Hypertension, Type II Diabetes, History of Burn, Weeks Of Treatment: 5 History: Osteoarthritis, Neuropathy Clustered Wound: No Photos Wound Measurements Length: (cm) Width: (cm) Depth: (cm) Area: (cm) Volume: (cm) 0 % Reduction in Area: 100% 0 % Reduction in Volume: 100% 0 Epithelialization: Small (1-33%) 0 Tunneling: No 0 Undermining: No Wound Description Classification: Grade 2 Wound Margin: Flat and Intact Exudate Amount: None Present Foul Odor After Cleansing: No Slough/Fibrino No Wound Bed Granulation Amount: None Present (0%)  Exposed Structure Necrotic Amount: None Present (0%) Fascia Exposed: No Fat Layer (Subcutaneous Tissue) Exposed: Yes Tendon Exposed: No Muscle Exposed: No Joint Exposed: No Bone Exposed: No Electronic Signature(s) Signed: 06/17/2021 5:46:45 PM By: Shawn Stall RN, BSN Entered By: Shawn Stall on 06/17/2021 09:10:40 -------------------------------------------------------------------------------- Wound Assessment Details Patient Name: Date of Service: [name], [name] 06/17/2021 8:30 A M Medical Record  Number: [id number] Patient Account Number: [id number] Date of Birth/Sex: Treating RN: 1981/08/15 (40 y.o. [name], [name] Primary Care Zayquan Bogard: [name], [name] Other Clinician: Referring Alaisha Eversley: Treating Dessirae Scarola/Extender: [name] [name], [name] in Treatment: 5 Wound Status Wound Number: 2 Primary Diabetic Wound/Ulcer of the Lower Extremity Etiology: Wound Location: Left T - Web between 4th and 5th oe Wound Healed - Epithelialized Wounding Event: Blister Status: Date Acquired: 05/31/2021 Comorbid Anemia, Hypertension, Type II Diabetes, History of Burn, Weeks Of Treatment: 2 History: Osteoarthritis, Neuropathy Clustered Wound: No Photos Wound Measurements Length: (cm) Width: (cm) Depth: (cm) Area: (cm) Volume: (cm) 0 % Reduction in Area: 100% 0 % Reduction in Volume: 100% 0 Epithelialization: Small (1-33%) 0 Tunneling: No 0 Undermining: No Wound Description Classification: Grade 1 Wound Margin: Distinct, outline attached Exudate Amount: None Present Foul Odor After Cleansing: No Slough/Fibrino No Wound Bed Granulation Amount: Large (67-100%) Exposed Structure Granulation Quality: Red Fascia Exposed: No Necrotic Amount: None Present (0%) Fat Layer (Subcutaneous Tissue) Exposed: Yes Tendon Exposed: No Muscle Exposed: No Joint Exposed: No Bone Exposed: No Electronic Signature(s) Signed: 06/17/2021 5:46:45 PM By: Shawn Stall RN, BSN Entered By: Shawn Stall on 06/17/2021 09:10:07 -------------------------------------------------------------------------------- Vitals Details Patient Name: Date of Service: [name], [name] 06/17/2021 8:30 A M Medical Record Number: [id number] Patient Account Number: [id number] Date of Birth/Sex: Treating RN: 1981/01/17 (40 y.o. [name], [name] Primary Care Catelyn Friel: [name], [name] Other Clinician: Referring Tayden Nichelson: Treating Channell Quattrone/Extender: [name] [name], [name] in Treatment:  5 Vital Signs Time Taken: 08:36 Temperature (F): 98.2 Height (in): 63 Pulse (bpm): 80 Weight (lbs): 215 Respiratory Rate (breaths/min): 18 Body Mass Index (BMI): 38.1 Blood Pressure (mmHg): 160/93 Reference Range: 80 - 120 mg / dl Electronic Signature(s) Signed: 06/17/2021 12:59:59 PM By: [name] Ito Entered By: [name] Ito on 06/17/2021 08:36:36
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
## 2021-07-15 ENCOUNTER — Other Ambulatory Visit: Payer: Self-pay

## 2021-07-15 ENCOUNTER — Emergency Department (HOSPITAL_COMMUNITY)
Admission: EM | Admit: 2021-07-15 | Discharge: 2021-07-15 | Disposition: A | Discharge status: Home / Self Care | Payer: BC Managed Care – PPO | Attending: Emergency Medicine | Admitting: Emergency Medicine

## 2021-07-15 ENCOUNTER — Encounter (HOSPITAL_COMMUNITY): Payer: Self-pay

## 2021-07-15 DIAGNOSIS — E119 Type 2 diabetes mellitus without complications: Secondary | ICD-10-CM | POA: Insufficient documentation

## 2021-07-15 DIAGNOSIS — Z20822 Contact with and (suspected) exposure to covid-19: Secondary | ICD-10-CM | POA: Diagnosis not present

## 2021-07-15 DIAGNOSIS — Z794 Long term (current) use of insulin: Secondary | ICD-10-CM | POA: Insufficient documentation

## 2021-07-15 DIAGNOSIS — J029 Acute pharyngitis, unspecified: Secondary | ICD-10-CM

## 2021-07-15 DIAGNOSIS — J209 Acute bronchitis, unspecified: Secondary | ICD-10-CM | POA: Diagnosis not present

## 2021-07-15 DIAGNOSIS — Z79899 Other long term (current) drug therapy: Secondary | ICD-10-CM | POA: Insufficient documentation

## 2021-07-15 LAB — RESP PANEL BY RT-PCR (FLU A&B, COVID) ARPGX2
Influenza A by PCR: NEGATIVE
Influenza B by PCR: NEGATIVE
SARS Coronavirus 2 by RT PCR: NEGATIVE

## 2021-07-15 LAB — GROUP A STREP BY PCR: Group A Strep by PCR: NOT DETECTED

## 2021-07-15 MED ORDER — AZITHROMYCIN 250 MG PO TABS: 250.0000 mg | ORAL_TABLET | Freq: Every day | ORAL | 0 refills | Status: DC

## 2021-07-15 NOTE — ED Notes (Signed)
Pt ambulatory in ED lobby. 

## 2021-07-15 NOTE — ED Triage Notes (Signed)
Patient c/o fatigue x 1 week and a sore throat x 3 days.

## 2021-07-15 NOTE — ED Provider Notes (Signed)
Emergency Medicine Provider Triage Evaluation Note  [name] , a 39 y.o. female  was evaluated in triage.  Pt complains of 1 week duration of fatigue, productive cough, lack of appetite, and 3-day duration of sore throat.  She denies fever, chills, abdominal pain, shortness of breath.   Review of Systems  Positive: Fatigue, cough, lack of appetite Negative: Abdominal pain fever, chills, shortness of breath  Physical Exam  BP 132/80 (BP Location: Left Arm)   Pulse 97   Temp 98.8 F (37.1 C) (Oral)   Resp 19   Ht 5\' 3"  (1.6 m)   Wt 108 kg   LMP 06/28/2021   SpO2 97%   BMI 42.16 kg/m  Gen:   Awake, no distress   Resp:  Normal effort  MSK:   Moves extremities without difficulty  Other:    Medical Decision Making  Medically screening exam initiated at 4:03 PM.  Appropriate orders placed.  Toluwani Ruder was informed that the remainder of the evaluation will be completed by another provider, this initial triage assessment does not replace that evaluation, and the importance of remaining in the ED until their evaluation is complete.     Verlin Grills, PA-C 07/15/21 1605    07/17/21, MD 07/15/21 [phone]

## 2021-07-15 NOTE — ED Provider Notes (Signed)
[facility]-EMERGENCY DEPT Provider Note   CSN: [id number] Arrival date & time: 07/15/21  1509     History Chief Complaint  Patient presents with   Fatigue   Sore Throat    [name] is a 40 y.o. female.  She is complaining of 1 week of fatigue nonproductive cough.  For the last 3 days she has had sore throat and lack of appetite.  No true fever no vomiting diarrhea or shortness of breath.  She is COVID vaccinated.  Boosted.  Works in a nursing home.  The history is provided by the patient.  Sore Throat This is a new problem. The current episode started more than 2 days ago. The problem occurs constantly. The problem has not changed since onset.Pertinent negatives include no chest pain, no abdominal pain, no headaches and no shortness of breath. The symptoms are aggravated by swallowing. Nothing relieves the symptoms. She has tried rest for the symptoms. The treatment provided no relief.      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     Family History  Problem Relation Age of Onset   Diabetes Mother    Hypertension Mother    Hyperlipidemia Mother    Cancer Father     Social History   Tobacco Use   Smoking status: Never   Smokeless tobacco: Never  Vaping Use   Vaping Use: Never used  Substance Use Topics   Alcohol use: No   Drug use: No    Home Medications Prior to Admission medications   Medication Sig Start Date End Date Taking? Authorizing  Provider  amoxicillin-clavulanate (AUGMENTIN) 875-125 MG tablet Take 1 tablet by mouth 2 (two) times daily. 04/08/21   Vivi Barrack, DPM  atorvastatin (LIPITOR) 40 MG tablet Take 40 mg by mouth daily.    [provider]  baclofen (LIORESAL) 20 MG tablet Take 20 mg by mouth at bedtime.    [provider]  celecoxib (CELEBREX) 200 MG capsule Take 200 mg by mouth daily as needed for mild pain.  07/18/20   [provider]  ciprofloxacin (CIPRO) 500 MG tablet Take 1 tablet (500 mg total) by mouth 2 (two) times daily. 04/17/21   Vivi Barrack, DPM  empagliflozin (JARDIANCE) 25 MG TABS tablet Take 25 mg by mouth daily.    [provider]  famotidine (PEPCID) 20 MG tablet Take 20 mg by mouth 2 (two) times daily as needed for heartburn or indigestion.    [provider]  Ferrous Sulfate (IRON) 325 (65 Fe) MG TABS Take 2 tablets every other day. 03/26/21   Molpus, John, MD  furosemide (LASIX) 20 MG tablet Take 2 tablets (40 mg total) by mouth daily. 05/20/21   Mesner, Barbara Cower, MD  insulin degludec (TRESIBA) 100 UNIT/ML FlexTouch Pen Inject 15 Units into the skin daily.  [provider]  lipase/protease/amylase (CREON) 36000 UNITS CPEP capsule Take 72,000-144,000 Units by mouth See admin instructions. Take 4 capsules (144,000 units) with meals & Take 2 capsules (72,000 units) with snacks 10/29/20   [provider]  nitrofurantoin, macrocrystal-monohydrate, (MACROBID) 100 MG capsule Take 100 mg by mouth 2 (two) times daily. 03/31/21   [provider]  ondansetron (ZOFRAN) 8 MG tablet Take 1 tablet (8 mg total) by mouth every 8 (eight) hours as needed for nausea or vomiting. 03/26/21   Molpus, John, MD  polyethylene glycol (MIRALAX / GLYCOLAX) 17 g packet Take 17 g by mouth daily as needed for mild constipation.    [provider]  pregabalin (LYRICA) 150 MG capsule Take 150 mg by mouth 2 (two) times daily. 05/07/20   [provider]  sertraline (ZOLOFT) 50 MG tablet Take 50 mg by mouth daily.    [provider]  TRULICITY 1.5 MG/0.5ML SOPN Inject 1.5 mg into the skin every Sunday. 03/19/21   [provider]    Allergies    Shellfish allergy, Magnesium-containing compounds, and Tramadol  Review of Systems   Review of Systems  Constitutional:  Positive for fatigue. Negative for fever.  HENT:  Positive for rhinorrhea and sore throat.   Eyes:  Negative for visual disturbance.  Respiratory:  Positive for cough. Negative for shortness of breath.   Cardiovascular:  Negative for chest pain.  Gastrointestinal:  Negative for abdominal pain.  Genitourinary:  Negative for dysuria.  Skin:  Negative for rash.  Neurological:  Negative for headaches.   Physical Exam Updated Vital Signs BP 132/80 (BP Location: Left Arm)   Pulse 97   Temp 98.8 F (37.1 C) (Oral)   Resp 19   Ht 5\' 3"  (1.6 m)   Wt 108 kg   LMP 06/28/2021   SpO2 97%   BMI 42.16 kg/m   Physical Exam Constitutional:      Appearance: Normal appearance. She is well-developed.  HENT:     Head: Normocephalic and atraumatic.     Nose: Congestion present.     Mouth/Throat:     Mouth: Mucous membranes are moist.     Pharynx: Oropharynx is clear. Posterior oropharyngeal erythema present. No oropharyngeal exudate.  Eyes:     Conjunctiva/sclera: Conjunctivae normal.  Cardiovascular:     Rate and Rhythm: Normal rate and regular rhythm.  Musculoskeletal:     Cervical back: Neck supple.  Skin:    General: Skin is warm and dry.  Neurological:     Mental Status: She is alert.     GCS: GCS eye subscore is 4. GCS verbal subscore is 5. GCS motor subscore is 6.    ED Results / Procedures / Treatments   Labs (all labs ordered are listed, but only abnormal results are displayed) Labs Reviewed  RESP PANEL BY RT-PCR (FLU A&B, COVID) ARPGX2  GROUP A STREP BY PCR    EKG None  Radiology No results  found.  Procedures Procedures   Medications Ordered in ED Medications - No data to display  ED Course  I have reviewed the triage vital signs and the nursing notes.  Pertinent labs & imaging results that were available during my care of the patient were reviewed by me and considered in my medical decision making (see chart for details).    MDM Rules/Calculators/A&P  Zeah Germano was evaluated in Emergency Department on 07/15/2021 for the symptoms described in the history of present illness. She was evaluated in the context of the global COVID-19 pandemic, which necessitated consideration that the patient might be at risk for infection with the SARS-CoV-2 virus that causes COVID-19. Institutional protocols and algorithms that pertain to the evaluation of patients at risk for COVID-19 are in a state of rapid change based on information released by regulatory bodies including the CDC and federal and state organizations. These policies and algorithms were followed during the patient's care in the ED.   Final Clinical Impression(s) / ED Diagnoses Final diagnoses:  Sore throat  Acute bronchitis, unspecified organism    Rx / DC Orders ED Discharge Orders          Ordered    azithromycin (ZITHROMAX) 250 MG tablet  Daily        07/15/21 1720             Terrilee Files, MD 07/16/21 1110

## 2021-07-15 NOTE — Discharge Instructions (Signed)
You are seen in the emergency department for sore throat cough.  Your COVID and flu test were negative.  Your strep test was pending at time of discharge and you can follow this up in MyChart.  We are putting you on some antibiotics.  Please drink plenty of fluids and use Tylenol and ibuprofen for pain.  Follow-up with your doctor.  Return to the emergency department if any worsening or concerning symptoms

## 2021-07-16 DIAGNOSIS — R0600 Dyspnea, unspecified: Secondary | ICD-10-CM | POA: Insufficient documentation

## 2021-07-16 DIAGNOSIS — E118 Type 2 diabetes mellitus with unspecified complications: Secondary | ICD-10-CM | POA: Insufficient documentation

## 2021-07-16 NOTE — Assessment & Plan Note (Deleted)
Will obtain a TTE.  This is likely due to diastolic dysfunction.  We will start spironolactone 25 mg daily and check BMP in 1 week's time;  continue the patient's Jardiance for treatment of diastolic dysfunction..  We will continue the patient's Lasix.  If needed we will pursue a right heart catheterization.  I do not believe that this represents an anginal equivalent as her CT scan in the emergency department demonstrated no coronary calcification.

## 2021-07-16 NOTE — Progress Notes (Deleted)
Cardiology Office Note:    Date:  07/17/2021   ID:  [name], DOB October 30, 1980, MRN [id number]  PCP:  [name], Landis Gandy, PA-C   [facility] Providers Cardiologist:  None { Click to update primary MD,subspecialty MD or APP then REFRESH:1}    Referring MD: Marily Memos, MD   Chief Complaint:  Dyspnea  History of Present Illness:    PROBLEM LIST: 1.  Hemoglobin C with anemia 2.  Type 2 diabetes with neuropathy 3.  Hyperlipidemia 4.  Diastolic dysfunction on echocardiogram 2018   [name] is a 40 y.o. female with the above listed medical problems here for dyspnea.  She presented to the emergency department in August with increasing lower extremity edema and dyspnea.  A chest CT demonstrated no pulmonary embolism and no coronary calcification.  Her BNP PE was moderately elevated at 200.  Her hemoglobin was 8.  Troponins were negative and EKG was unremarkable with normal sinus rhythm and no acute ischemic changes.  She was discharged on Lasix.    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     Allergies:   Shellfish allergy, Magnesium-containing compounds, and Tramadol   Social History:    Social History   Socioeconomic History   Marital status: Married    Spouse name: Not on file   Number of children: Not on file   Years of education: Not on file   Highest education level: Not on file  Occupational History   Not on file   Tobacco Use   Smoking status: Never   Smokeless tobacco: Never  Vaping Use   Vaping Use: Never used  Substance and Sexual Activity   Alcohol use: No   Drug use: No   Sexual activity: Yes    Birth control/protection: Surgical  Other Topics Concern   Not on file  Social History Narrative   Not on file   Social Determinants of Health   Financial Resource Strain: Not on file  Food Insecurity: Not on file  Transportation Needs: Not on file  Physical Activity: Not on file  Stress: Not on file  Social Connections: Not on file     Family History:  The patient's family history includes Cancer in her father; Diabetes in her mother; Hyperlipidemia in her mother; Hypertension in her mother.  ROS:  Please see the history of present illness.  All other systems reviewed and are negative.  EKGs/Labs/Other Studies Reviewed:    The following studies were reviewed today: CT scan and EKG as detailed above  EKG:   The ekg ordered today demonstrates ***  Recent Labs: 04/02/2021: Magnesium 2.4 04/06/2021: TSH 2.096 05/19/2021: ALT 13; B Natriuretic Peptide 200.9; BUN 17; Creatinine, Ser 0.99; Hemoglobin 8.0; Platelets 241; Potassium 4.1; Sodium 141   Recent Lipid Panel No results found for: CHOL, TRIG, HDL, CHOLHDL, VLDL, LDLCALC, LDLDIRECT  Risk Assessment/Calculations:   {Does this patient have ATRIAL FIBRILLATION?:[phone]}       Physical Exam:    VS:  LMP 06/28/2021     Wt Readings from Last 3 Encounters:  07/15/21 238 lb (108 kg)  04/01/21 224 lb 10.4 oz (101.9 kg)  12/10/20 218 lb (98.9 kg)     GEN: *** Well nourished, well developed in no acute distress HEENT: Normal NECK: No JVD; No carotid bruits LYMPHATICS: No lymphadenopathy CARDIAC: ***RRR, no murmurs, rubs, gallops RESPIRATORY:  Clear to auscultation without rales, wheezing or rhonchi  ABDOMEN: Soft, non-tender, non-distended MUSCULOSKELETAL:  No edema; No deformity  SKIN: Warm and dry NEUROLOGIC:  Alert  and oriented x 3 PSYCHIATRIC:  Normal affect   ASSESSMENT and PLAN   Dyspnea Will obtain a TTE.  This is likely due to diastolic dysfunction.  We will start spironolactone 25 mg daily and check BMP in 1 week's time;  continue the patient's Jardiance for treatment of diastolic dysfunction..  We will continue the patient's Lasix.  If needed we will pursue a right heart catheterization.  I do not believe that this represents an anginal equivalent as her CT scan in the emergency department demonstrated no coronary calcification.  Type 2 diabetes mellitus with complication, with long-term current use of insulin (HCC) We will start aspirin 81 mg daily given her history of diabetes.  She should continue her statin for an LDL goal of less than 70.  She is already on Jardiance.  Symptomatic anemia Will check a CBC to make sure this is within normal limits.   {Are you ordering a CV Procedure (e.g. stress test, cath, DCCV, TEE, etc)?   Press F2        :[id number]}    Medication Adjustments/Labs and Tests Ordered: Current medicines are reviewed at length with the patient today.  Concerns regarding medicines are outlined above.  No orders of the defined types were placed in this encounter.  No orders of the defined types were placed in this encounter.   There are no Patient Instructions on file for this visit.   Signed, Orbie Pyo, MD  07/17/2021 2:04 PM    [facility]

## 2021-07-16 NOTE — Assessment & Plan Note (Deleted)
We will start aspirin 81 mg daily given her history of diabetes.  She should continue her statin for an LDL goal of less than 70.  She is already on Jardiance.

## 2021-07-17 ENCOUNTER — Ambulatory Visit: Payer: BC Managed Care – PPO | Admitting: Internal Medicine

## 2021-07-17 NOTE — Assessment & Plan Note (Deleted)
Will check a CBC to make sure this is within normal limits.
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
## 2021-07-21 ENCOUNTER — Other Ambulatory Visit: Payer: Self-pay

## 2021-07-21 ENCOUNTER — Ambulatory Visit (INDEPENDENT_AMBULATORY_CARE_PROVIDER_SITE_OTHER): Payer: BC Managed Care – PPO | Admitting: Internal Medicine

## 2021-07-21 ENCOUNTER — Encounter: Payer: Self-pay | Admitting: Internal Medicine

## 2021-07-21 VITALS — BP 146/82 | HR 85 | Ht 63.0 in | Wt 233.6 lb

## 2021-07-21 DIAGNOSIS — R0602 Shortness of breath: Secondary | ICD-10-CM

## 2021-07-21 DIAGNOSIS — I10 Essential (primary) hypertension: Secondary | ICD-10-CM

## 2021-07-21 MED ORDER — LISINOPRIL 20 MG PO TABS: 20.0000 mg | ORAL_TABLET | Freq: Every day | ORAL | 3 refills | Status: DC

## 2021-07-21 MED ORDER — AMLODIPINE BESYLATE 5 MG PO TABS: 5.0000 mg | ORAL_TABLET | Freq: Every day | ORAL | 3 refills | Status: DC

## 2021-07-21 NOTE — Patient Instructions (Signed)
Medication Instructions:  START LISINOPRIL 20 MG ONCE A DAY START AMLODIPINE 5 MG ONCE A DAY *If you need a refill on your cardiac medications before your next appointment, please call your pharmacy*    Testing/Procedures: Your physician has requested that you have a lexiscan myoview. For further information please visit [email or web address] Please follow instruction sheet, as given.   Follow-Up: At [facility], you and your health needs are our priority.  As part of our continuing mission to provide you with exceptional heart care, we have created designated Provider Care Teams.  These Care Teams include your primary Cardiologist (physician) and Advanced Practice Providers (APPs -  Physician Assistants and Nurse Practitioners) who all work together to provide you with the care you need, when you need it.  We recommend signing up for the patient portal called "MyChart".  Sign up information is provided on this After Visit Summary.  MyChart is used to connect with patients for Virtual Visits (Telemedicine).  Patients are able to view lab/test results, encounter notes, upcoming appointments, etc.  Non-urgent messages can be sent to your provider as well.   To learn more about what you can do with MyChart, go to [email or web address].    Your next appointment:   3 month(s)  The format for your next appointment:   In Person  Provider:   Dr. Wyline Mood

## 2021-07-25 ENCOUNTER — Telehealth (HOSPITAL_COMMUNITY): Payer: Self-pay | Admitting: *Deleted

## 2021-07-25 NOTE — Telephone Encounter (Signed)
Close encounter 

## 2021-07-29 ENCOUNTER — Ambulatory Visit (HOSPITAL_COMMUNITY)
Admission: RE | Admit: 2021-07-29 | Discharge: 2021-07-29 | Disposition: A | Discharge status: Home / Self Care | Payer: BC Managed Care – PPO | Source: Ambulatory Visit | Attending: Cardiology | Admitting: Cardiology

## 2021-07-29 ENCOUNTER — Other Ambulatory Visit: Payer: Self-pay

## 2021-07-29 DIAGNOSIS — R0602 Shortness of breath: Secondary | ICD-10-CM | POA: Insufficient documentation

## 2021-07-29 MED ORDER — TECHNETIUM TC 99M TETROFOSMIN IV KIT
24.0000 | PACK | Freq: Once | INTRAVENOUS | Status: AC | PRN
  Administered 2021-07-29: 24 via INTRAVENOUS
  Filled 2021-07-29: qty 24

## 2021-07-29 MED ORDER — REGADENOSON 0.4 MG/5ML IV SOLN
0.4000 mg | Freq: Once | INTRAVENOUS | Status: AC
  Administered 2021-07-29: 0.4 mg via INTRAVENOUS

## 2021-07-30 ENCOUNTER — Ambulatory Visit (HOSPITAL_COMMUNITY)
Admission: RE | Admit: 2021-07-30 | Discharge: 2021-07-30 | Disposition: A | Discharge status: Home / Self Care | Payer: BC Managed Care – PPO | Source: Ambulatory Visit | Attending: Cardiology | Admitting: Cardiology

## 2021-07-30 LAB — MYOCARDIAL PERFUSION IMAGING
Base ST Depression (mm): 0 mm
LV dias vol: 126 mL (ref 46–106)
LV sys vol: 56 mL
Nuc Stress EF: 55 %
Peak HR: 96 {beats}/min
Rest HR: 76 {beats}/min
Rest Nuclear Isotope Dose: 26.4 mCi
SDS: 1
SRS: 4
SSS: 5
ST Depression (mm): 0 mm
Stress Nuclear Isotope Dose: 24 mCi
TID: 0.95

## 2021-07-30 MED ORDER — TECHNETIUM TC 99M TETROFOSMIN IV KIT
26.4000 | PACK | Freq: Once | INTRAVENOUS | Status: AC | PRN
  Administered 2021-07-30: 26.4 via INTRAVENOUS

## 2021-10-22 ENCOUNTER — Emergency Department (HOSPITAL_COMMUNITY)
Admission: EM | Admit: 2021-10-22 | Discharge: 2021-10-23 | Disposition: A | Discharge status: Home / Self Care | Payer: BC Managed Care – PPO | Attending: Physician Assistant | Admitting: Physician Assistant

## 2021-10-22 ENCOUNTER — Ambulatory Visit: Payer: BC Managed Care – PPO | Admitting: Internal Medicine

## 2021-10-22 ENCOUNTER — Emergency Department (HOSPITAL_COMMUNITY): Payer: BC Managed Care – PPO

## 2021-10-22 ENCOUNTER — Other Ambulatory Visit: Payer: Self-pay

## 2021-10-22 ENCOUNTER — Encounter (HOSPITAL_COMMUNITY): Payer: Self-pay | Admitting: Emergency Medicine

## 2021-10-22 DIAGNOSIS — Z20822 Contact with and (suspected) exposure to covid-19: Secondary | ICD-10-CM | POA: Diagnosis not present

## 2021-10-22 DIAGNOSIS — M79621 Pain in right upper arm: Secondary | ICD-10-CM | POA: Insufficient documentation

## 2021-10-22 DIAGNOSIS — Z5321 Procedure and treatment not carried out due to patient leaving prior to being seen by health care provider: Secondary | ICD-10-CM | POA: Diagnosis not present

## 2021-10-22 DIAGNOSIS — R109 Unspecified abdominal pain: Secondary | ICD-10-CM | POA: Diagnosis not present

## 2021-10-22 DIAGNOSIS — Z7951 Long term (current) use of inhaled steroids: Secondary | ICD-10-CM | POA: Diagnosis not present

## 2021-10-22 DIAGNOSIS — Z8616 Personal history of COVID-19: Secondary | ICD-10-CM | POA: Insufficient documentation

## 2021-10-22 DIAGNOSIS — R059 Cough, unspecified: Secondary | ICD-10-CM | POA: Insufficient documentation

## 2021-10-22 DIAGNOSIS — R0602 Shortness of breath: Secondary | ICD-10-CM | POA: Insufficient documentation

## 2021-10-22 DIAGNOSIS — K910 Vomiting following gastrointestinal surgery: Secondary | ICD-10-CM | POA: Diagnosis not present

## 2021-10-22 LAB — CBC WITH DIFFERENTIAL/PLATELET
Abs Immature Granulocytes: 0.01 10*3/uL (ref 0.00–0.07)
Basophils Absolute: 0 10*3/uL (ref 0.0–0.1)
Basophils Relative: 1 %
Eosinophils Absolute: 0.1 10*3/uL (ref 0.0–0.5)
Eosinophils Relative: 3 %
HCT: 26.4 % — ABNORMAL LOW (ref 36.0–46.0)
Hemoglobin: 9.1 g/dL — ABNORMAL LOW (ref 12.0–15.0)
Immature Granulocytes: 0 %
Lymphocytes Relative: 22 %
Lymphs Abs: 0.8 10*3/uL (ref 0.7–4.0)
MCH: 28.5 pg (ref 26.0–34.0)
MCHC: 34.5 g/dL (ref 30.0–36.0)
MCV: 82.8 fL (ref 80.0–100.0)
Monocytes Absolute: 0.5 10*3/uL (ref 0.1–1.0)
Monocytes Relative: 14 %
Neutro Abs: 2.2 10*3/uL (ref 1.7–7.7)
Neutrophils Relative %: 60 %
Platelets: 243 10*3/uL (ref 150–400)
RBC: 3.19 MIL/uL — ABNORMAL LOW (ref 3.87–5.11)
RDW: 14.6 % (ref 11.5–15.5)
WBC: 3.7 10*3/uL — ABNORMAL LOW (ref 4.0–10.5)
nRBC: 0 % (ref 0.0–0.2)

## 2021-10-22 LAB — I-STAT BETA HCG BLOOD, ED (MC, WL, AP ONLY): I-stat hCG, quantitative: <5 m[IU]/mL (ref <5)

## 2021-10-22 LAB — COMPREHENSIVE METABOLIC PANEL
ALT: 24 U/L (ref 0–44)
AST: 19 U/L (ref 15–41)
Albumin: 2.8 g/dL — ABNORMAL LOW (ref 3.5–5.0)
Alkaline Phosphatase: 74 U/L (ref 38–126)
Anion gap: 6 (ref 5–15)
BUN: 32 mg/dL — ABNORMAL HIGH (ref 6–20)
CO2: 21 mmol/L — ABNORMAL LOW (ref 22–32)
Calcium: 8.2 mg/dL — ABNORMAL LOW (ref 8.9–10.3)
Chloride: 111 mmol/L (ref 98–111)
Creatinine, Ser: 1.41 mg/dL — ABNORMAL HIGH (ref 0.44–1.00)
GFR, Estimated: 48 mL/min — ABNORMAL LOW (ref >60)
Glucose, Bld: 95 mg/dL (ref 70–99)
Potassium: 4.3 mmol/L (ref 3.5–5.1)
Sodium: 138 mmol/L (ref 135–145)
Total Bilirubin: 0.4 mg/dL (ref 0.3–1.2)
Total Protein: 6.8 g/dL (ref 6.5–8.1)

## 2021-10-22 LAB — RESP PANEL BY RT-PCR (FLU A&B, COVID) ARPGX2
Influenza A by PCR: NEGATIVE
Influenza B by PCR: NEGATIVE
SARS Coronavirus 2 by RT PCR: NEGATIVE

## 2021-10-22 LAB — LIPASE, BLOOD: Lipase: 36 U/L (ref 11–51)

## 2021-10-22 LAB — TROPONIN I (HIGH SENSITIVITY): Troponin I (High Sensitivity): 3 ng/L (ref <18)
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
## 2021-10-22 NOTE — ED Provider Triage Note (Signed)
Emergency Medicine Provider Triage Evaluation Note  [name] , a 41 y.o. female  was evaluated in triage.  Pt complains of right proximal humerus pain after fall 1 month ago. Worse with movement. Gets stiff. No paresthesias, weakness, swelling. Also with cough, sob, recent covid outbreak at work. No LE ,edema, hx of PE, dvt. Cough productive of mucous. No hemoptysis.  Some chest tightness. "Heart" procedure tomorrow. No back pain, syncope. Some post tussive emesis  Review of Systems  Positive: Right arm pain, cough, sob Negative: fever  Physical Exam  BP (!) 185/96    Pulse 91    Temp 98.6 F (37 C) (Oral)    Resp 18    Ht 5\' 3"  (1.6 m)    Wt 111.1 kg    SpO2 98%    BMI 43.40 kg/m  Gen:   Awake, no distress   Resp:  Normal effort  MSK:   Moves extremities without difficulty Other:    Medical Decision Making  Medically screening exam initiated at 3:40 PM.  Appropriate orders placed.  [name] was informed that the remainder of the evaluation will be completed by another provider, this initial triage assessment does not replace that evaluation, and the importance of remaining in the ED until their evaluation is complete.  Right shoulder pain after fall 1 month ago, cough, sob   Alferd Obryant A, PA-C 10/22/21 1542

## 2021-10-23 ENCOUNTER — Ambulatory Visit (INDEPENDENT_AMBULATORY_CARE_PROVIDER_SITE_OTHER): Payer: BC Managed Care – PPO | Admitting: Internal Medicine

## 2021-10-23 VITALS — BP 144/78 | HR 81 | Ht 63.0 in | Wt 249.4 lb

## 2021-10-23 DIAGNOSIS — R0602 Shortness of breath: Secondary | ICD-10-CM | POA: Diagnosis not present

## 2021-10-23 MED ORDER — AMLODIPINE BESYLATE 5 MG PO TABS: 5.0000 mg | ORAL_TABLET | Freq: Every day | ORAL | 3 refills | Status: DC

## 2021-10-23 NOTE — Addendum Note (Signed)
Addended by: Neoma Laming on: 10/23/2021 04:37 PM   Modules accepted: Orders

## 2021-10-23 NOTE — Patient Instructions (Signed)
Medication Instructions: Continue same medications   Lab Work: Have BNP today   Testing/Procedures: Schedule Echo   Follow-Up: At [facility], you and your health needs are our priority.  As part of our continuing mission to provide you with exceptional heart care, we have created designated Provider Care Teams.  These Care Teams include your primary Cardiologist (physician) and Advanced Practice Providers (APPs -  Physician Assistants and Nurse Practitioners) who all work together to provide you with the care you need, when you need it.  We recommend signing up for the patient portal called "MyChart".  Sign up information is provided on this After Visit Summary.  MyChart is used to connect with patients for Virtual Visits (Telemedicine).  Patients are able to view lab/test results, encounter notes, upcoming appointments, etc.  Non-urgent messages can be sent to your provider as well.   To learn more about what you can do with MyChart, go to [email or web address].    Your next appointment:  As Needed    The format for your next appointment: Office     Provider:  Dr.Branch
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
## 2021-10-24 LAB — BRAIN NATRIURETIC PEPTIDE: BNP: 23.8 pg/mL (ref 0.0–100.0)

## 2021-11-04 ENCOUNTER — Ambulatory Visit (HOSPITAL_COMMUNITY): Payer: BC Managed Care – PPO | Attending: Internal Medicine

## 2021-11-04 ENCOUNTER — Encounter (HOSPITAL_COMMUNITY): Payer: Self-pay | Admitting: Cardiology

## 2022-09-21 IMAGING — US US PELVIS COMPLETE WITH TRANSVAGINAL
1 series · 14 of 25 positions shown · non-contrast
Comparison: None

CLINICAL DATA: Heavy periods

EXAM:
TRANSABDOMINAL AND TRANSVAGINAL ULTRASOUND OF PELVIS
TECHNIQUE: Both transabdominal and transvaginal ultrasound examinations of the
pelvis were performed. Transabdominal technique was performed for
global imaging of the pelvis including uterus, ovaries, adnexal
regions, and pelvic cul-de-sac. It was necessary to proceed with
endovaginal exam following the transabdominal exam to visualize the
ovaries and endometrium.

[Series 1: us pelvis complete with transvaginal · 14 of 69 slices shown]
[im 1/69]
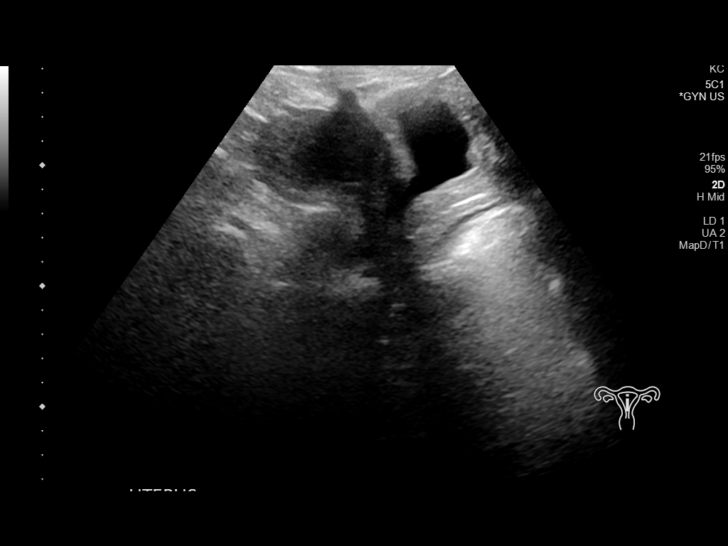
[im 6/69]
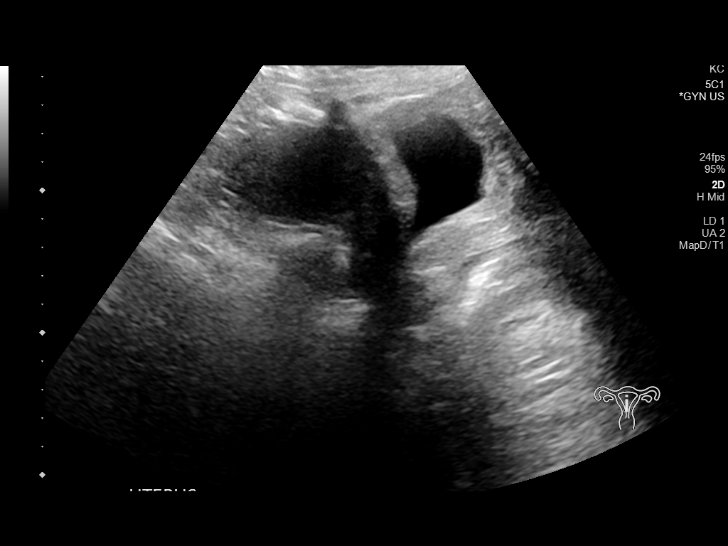
[im 12/69]
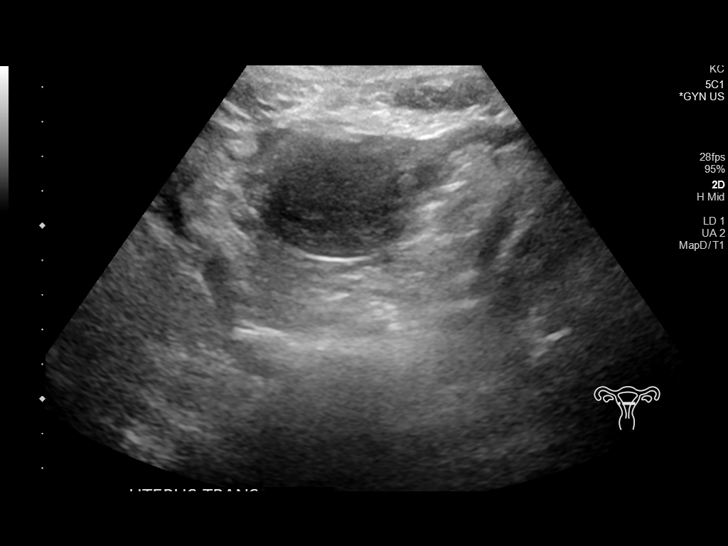
[im 18/69]
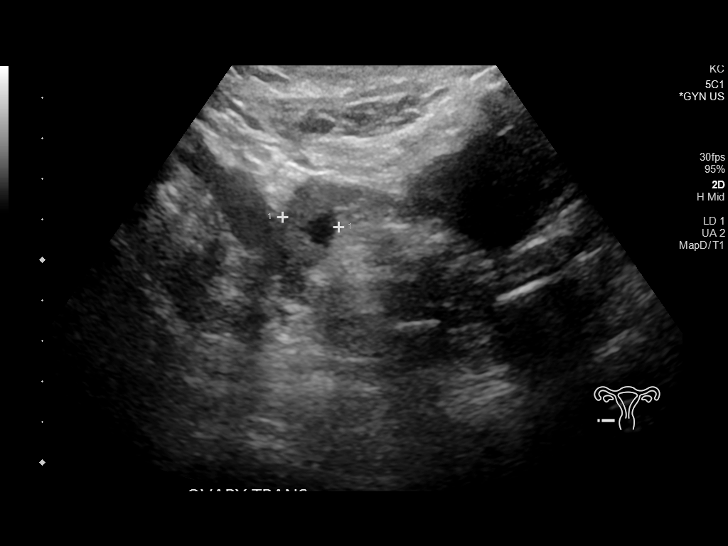
[im 23/69]
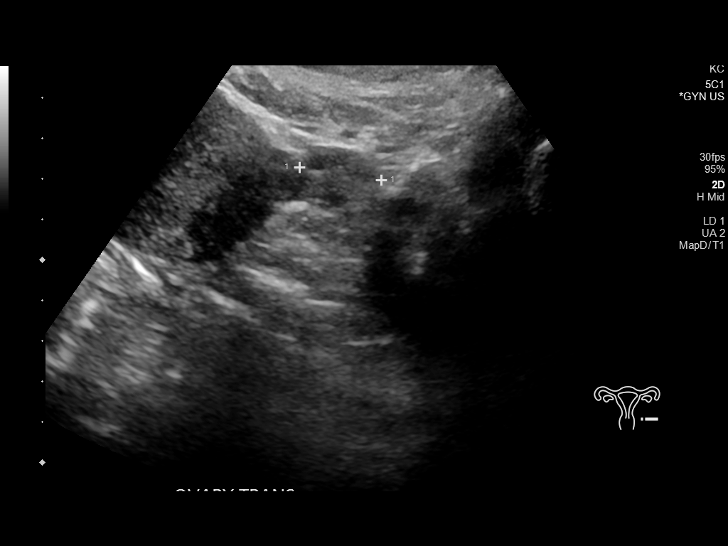
[im 26/69]
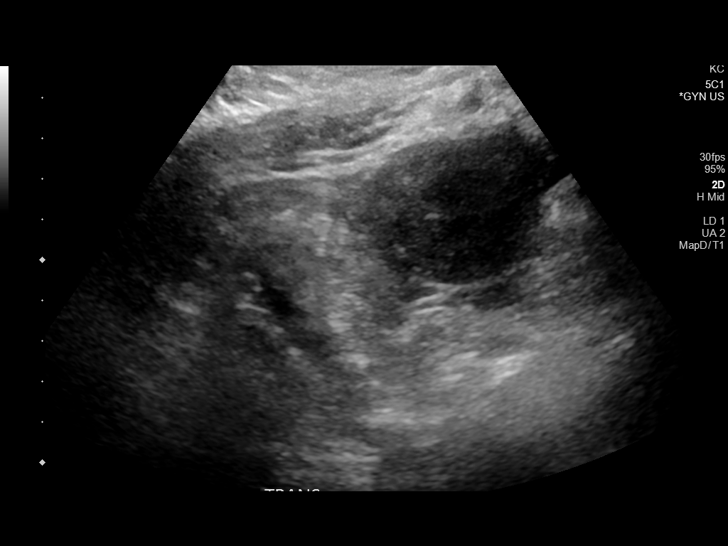
[im 32/69]
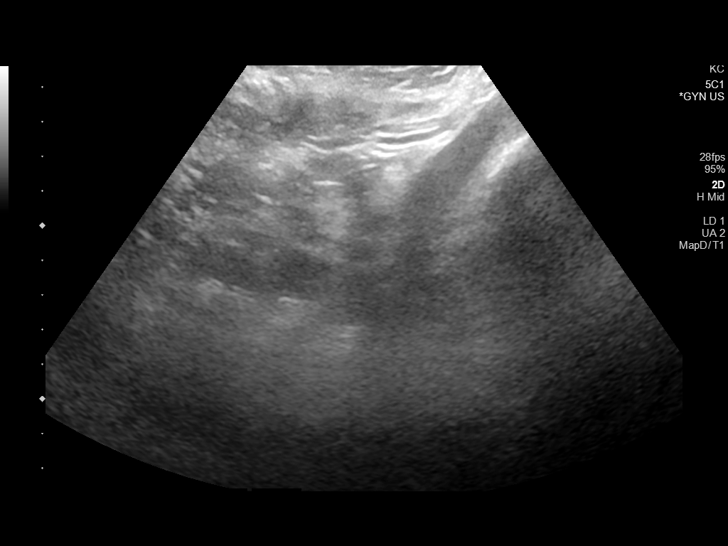
[im 37/69]
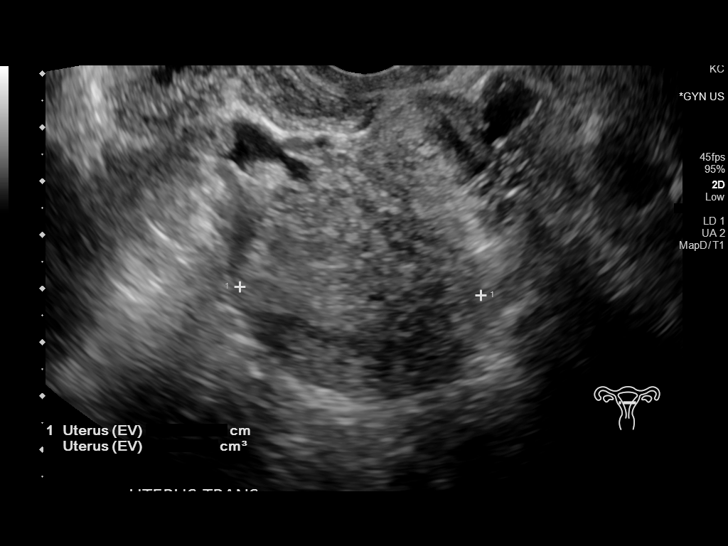
[im 43/69]
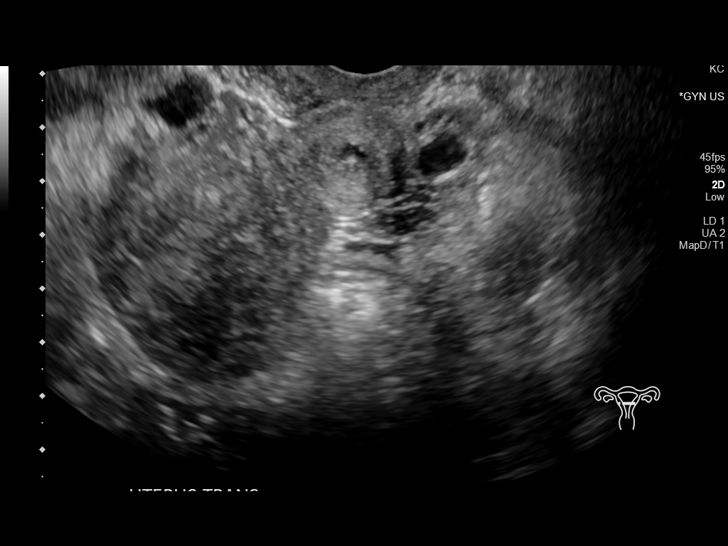
[im 46/69]
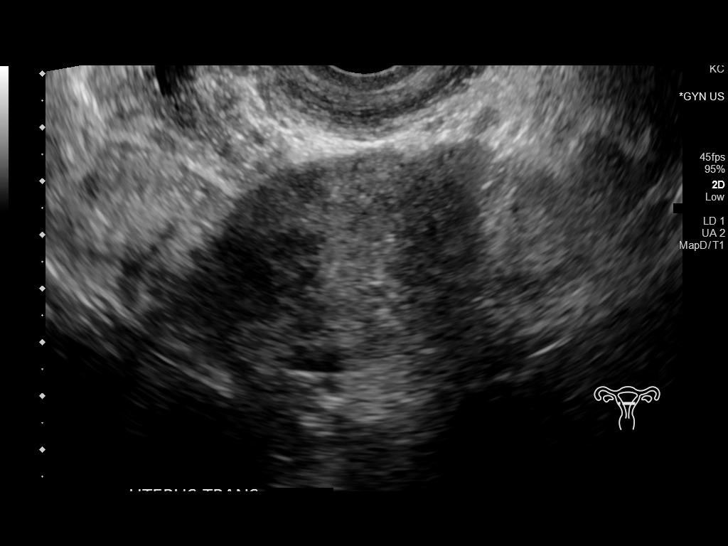
[im 52/69]
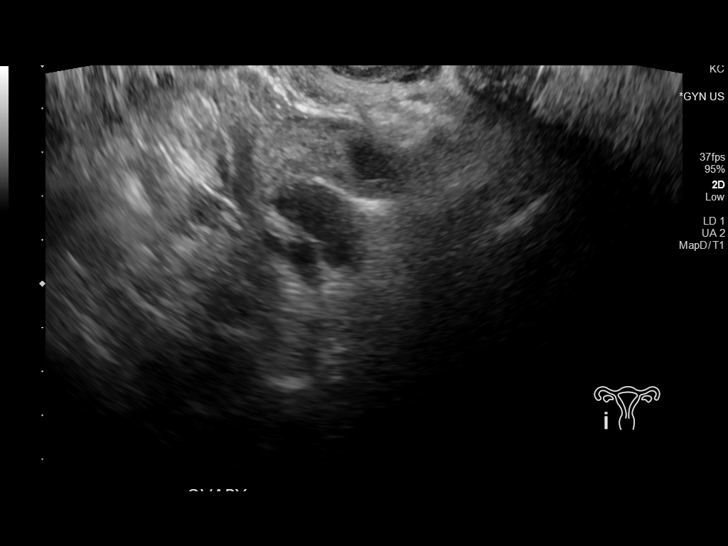
[im 57/69]
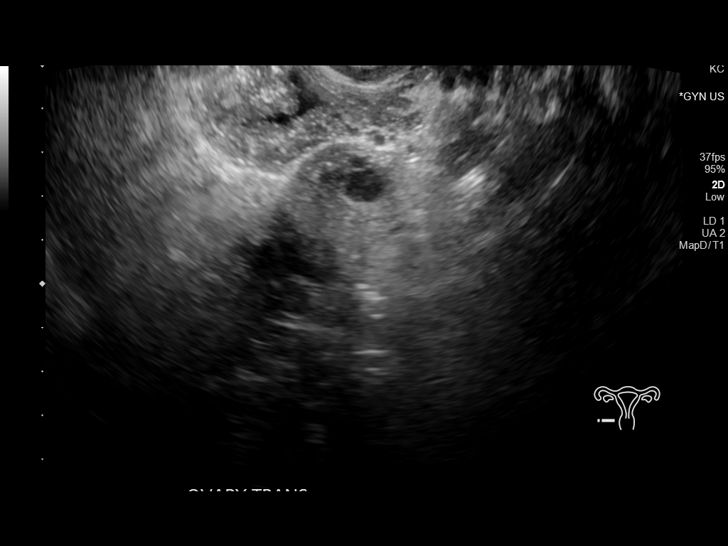
[im 63/69]
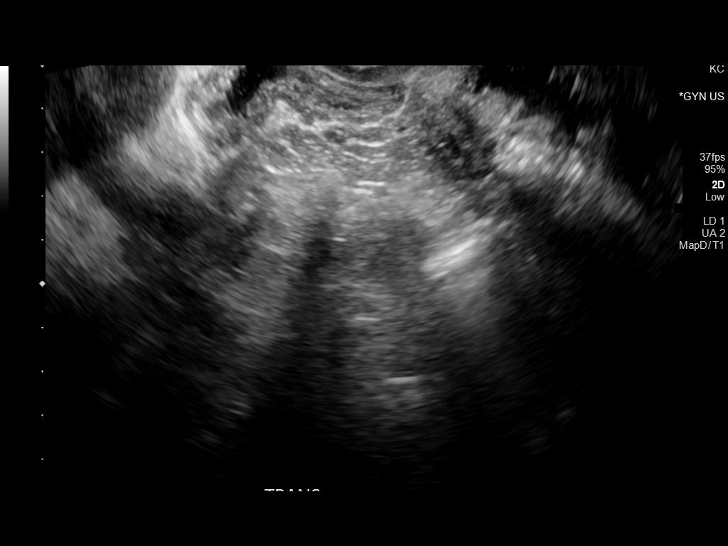
[im 69/69]
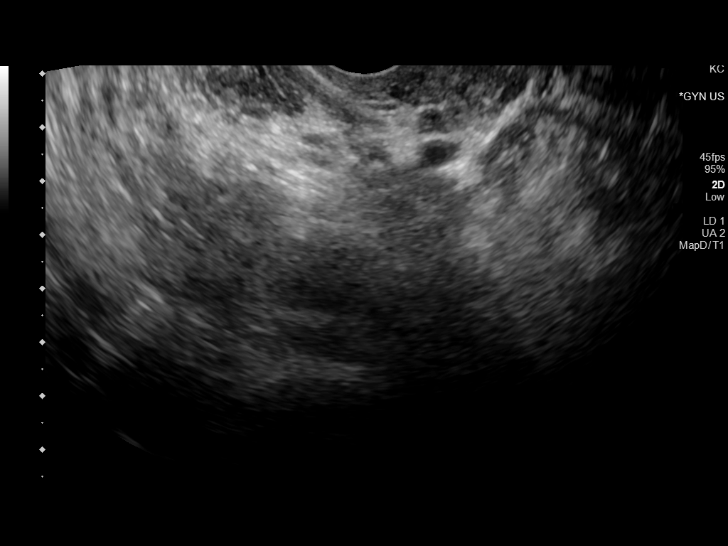

[14 of 25 positions shown; findings below may reference images not displayed]

FINDINGS: Uterus

Measurements: 8.7 x 3.9 x 4.5 cm. = volume: 79 mL. No fibroids or
other mass visualized.

Endometrium

Thickness: 8 mm.  No focal abnormality visualized.

Right ovary

Measurements: 3.3 x 1.6 x 2.9 cm. = volume: 7.8 mL. Normal
appearance/no adnexal mass.

Left ovary

Measurements: 3.0 x 1.2 x 2.9 cm. = volume: 5.3 mL. Normal
appearance/no adnexal mass.

Other findings

No abnormal free fluid.
IMPRESSION: No acute abnormality is noted within the pelvis.

## 2022-10-27 ENCOUNTER — Other Ambulatory Visit: Payer: Self-pay | Admitting: Internal Medicine

## 2022-11-09 IMAGING — CT CT ANGIO CHEST
2 of 6 series · 18 of 36 positions shown · IV contrast (OMNIPAQUE 350)
Comparison: None.

CLINICAL DATA: Shortness of breath

EXAM:
CT ANGIOGRAPHY CHEST WITH CONTRAST
TECHNIQUE: Multidetector CT imaging of the chest was performed using the
standard protocol during bolus administration of intravenous
contrast. Multiplanar CT image reconstructions and MIPs were
obtained to evaluate the vascular anatomy.
CONTRAST:  100mL OMNIPAQUE IOHEXOL 350 MG/ML SOLN

[Series 5: thins · axial · 0.62mm/px · z∈[+1526,+1734]mm · 17 of 234 slices shown]
[im 13/234  lung]
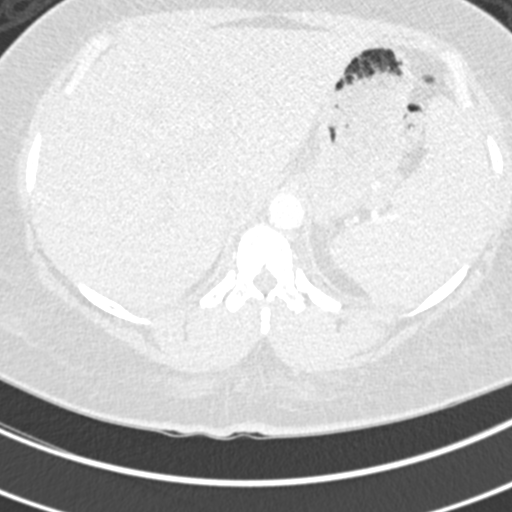
[im 26/234  mediastinal]
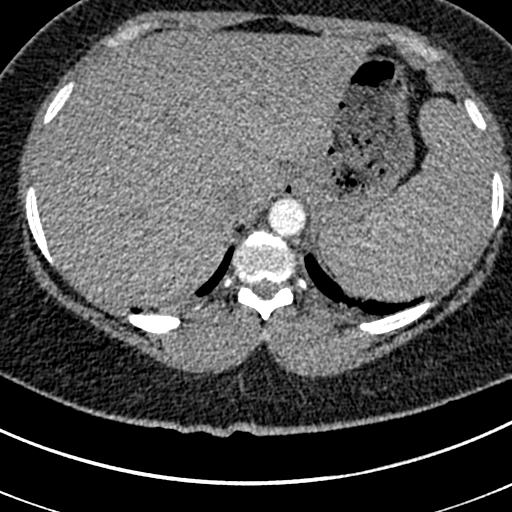
[im 39/234  lung]
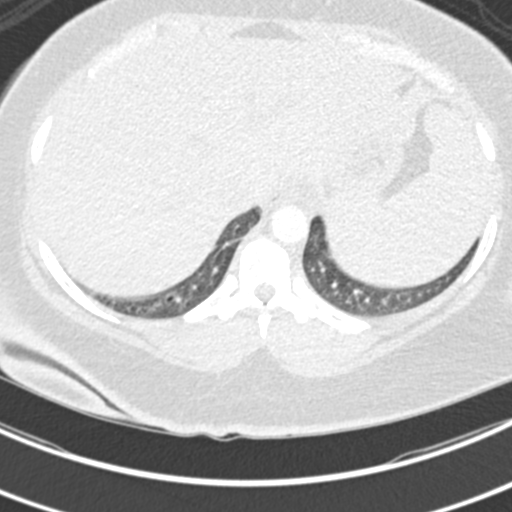
[im 52/234  mediastinal]
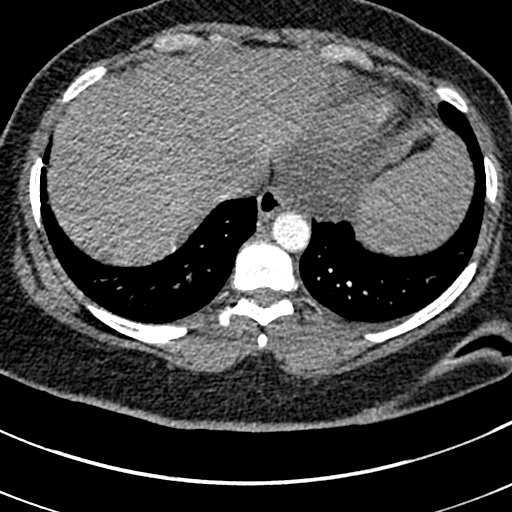
[im 65/234  lung]
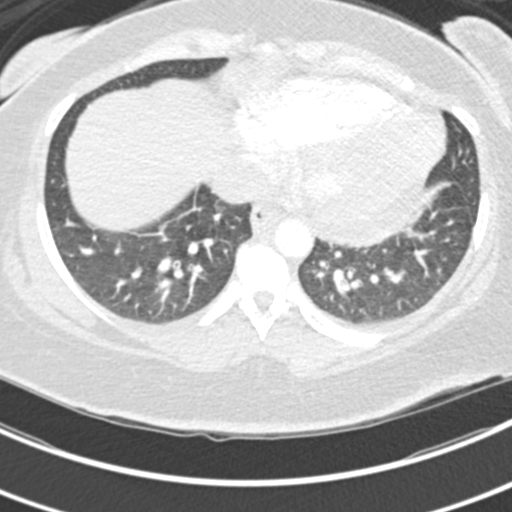
[im 78/234  mediastinal]
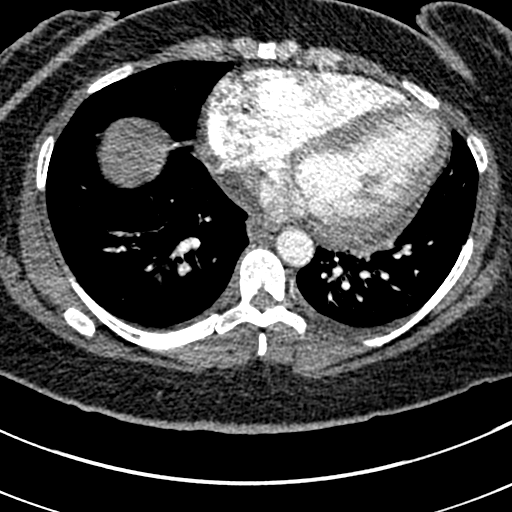
[im 91/234  lung]
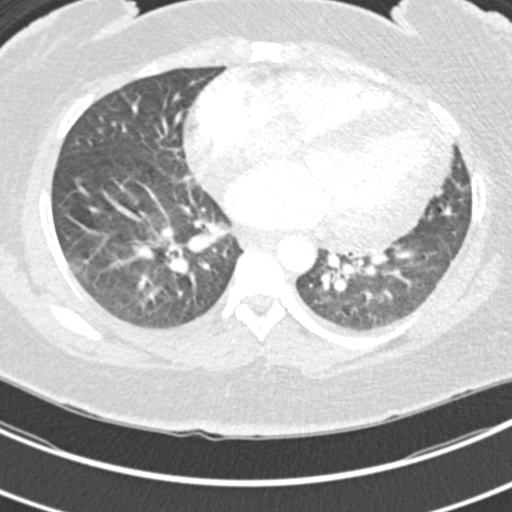
[im 104/234  mediastinal]
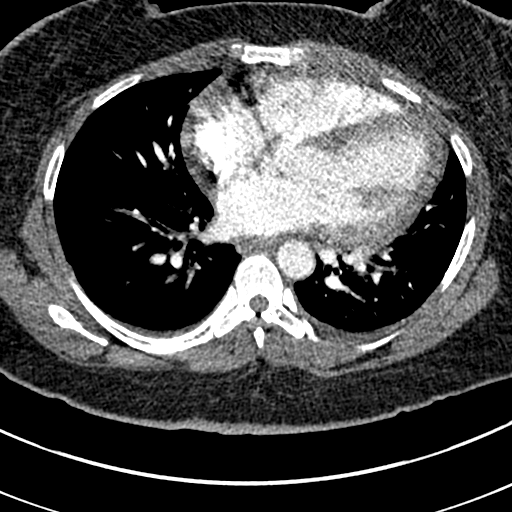
[im 117/234  lung]
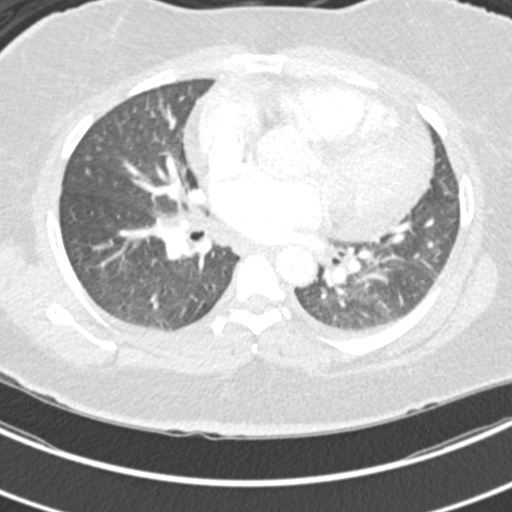
[im 130/234  mediastinal]
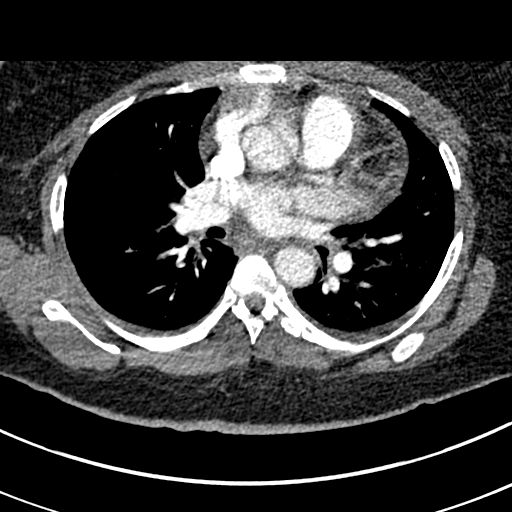
[im 143/234  lung]
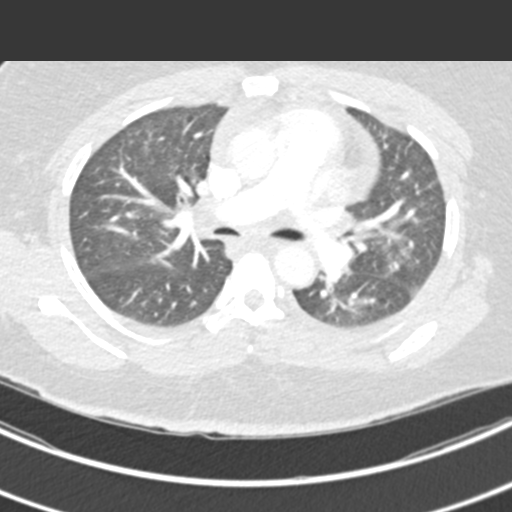
[im 156/234  mediastinal]
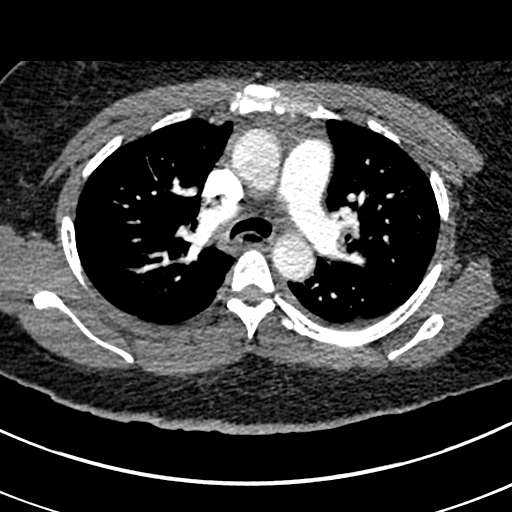
[im 169/234  lung]
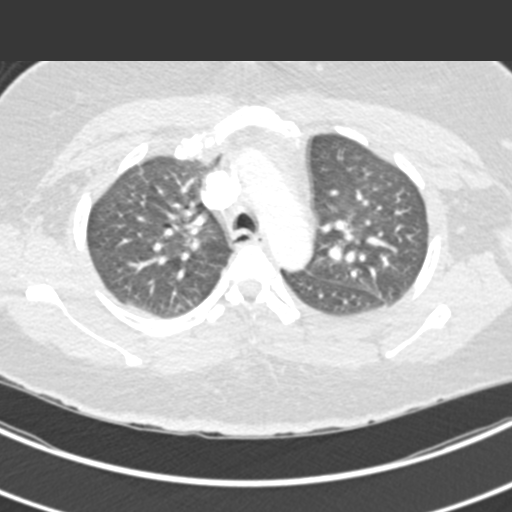
[im 182/234  mediastinal]
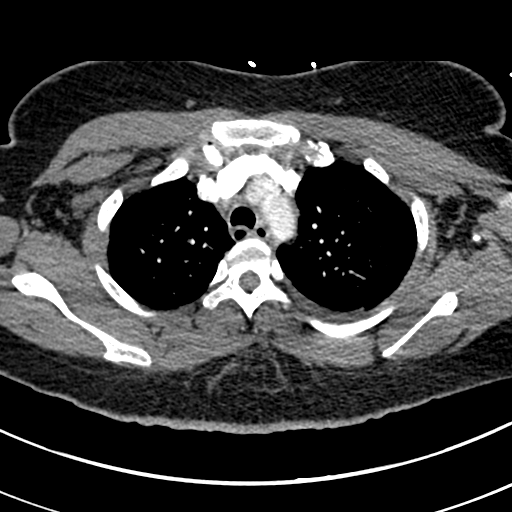
[im 195/234  lung]
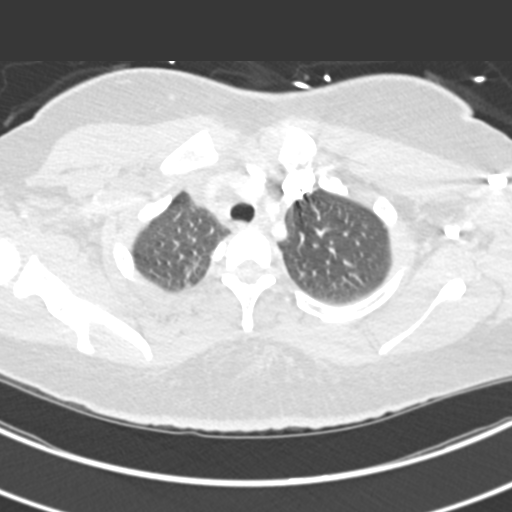
[im 208/234  mediastinal]
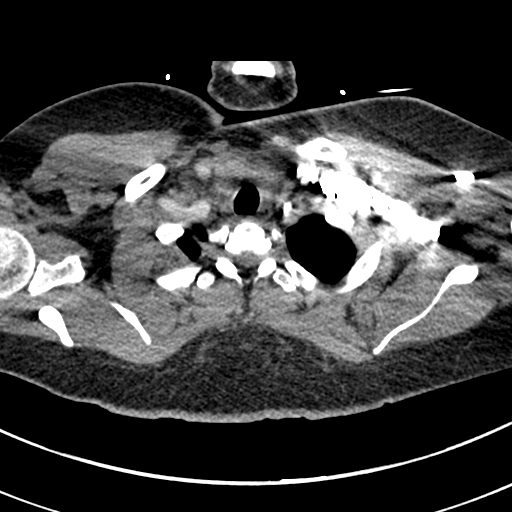
[im 221/234  lung]
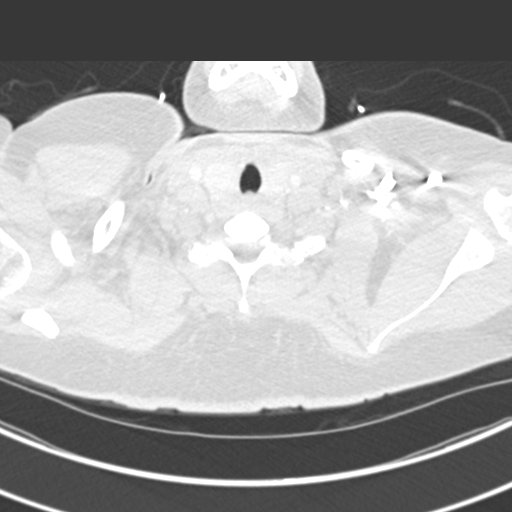

[Series 7: coronal mpr · coronal · 0.47mm/px · 1 of 130 slices shown]
[im 65/130  mediastinal]
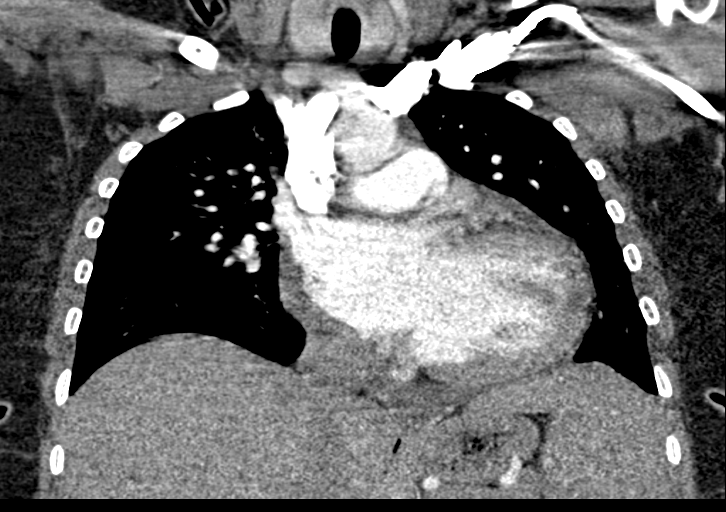

[18 of 36 positions shown; findings below may reference images not displayed]

FINDINGS: Cardiovascular: Contrast injection is sufficient to demonstrate
satisfactory opacification of the pulmonary arteries to the
segmental level. There is no pulmonary embolus or evidence of right
heart strain. The size of the main pulmonary artery is normal. Heart
size is normal, with no pericardial effusion. The course and caliber
of the aorta are normal. There is no atherosclerotic calcification.
Opacification decreased due to pulmonary arterial phase contrast
bolus timing.

Mediastinum/Nodes: No mediastinal, hilar or axillary
lymphadenopathy. Normal visualized thyroid. Thoracic esophageal
course is normal.

Lungs/Pleura: Bilateral peribronchial thickening with mild
ground-glass opacity.

Upper Abdomen: Contrast bolus timing is not optimized for evaluation
of the abdominal organs. The visualized portions of the organs of
the upper abdomen are normal.

Musculoskeletal: No chest wall abnormality. No bony spinal canal
stenosis.

Review of the MIP images confirms the above findings.
IMPRESSION: 1. No pulmonary embolus or acute aortic syndrome.
2. Bilateral peribronchial thickening and mild ground-glass opacity,
which could indicate pulmonary edema.

## 2023-02-27 ENCOUNTER — Other Ambulatory Visit: Payer: Self-pay

## 2023-02-27 DIAGNOSIS — N179 Acute kidney failure, unspecified: Secondary | ICD-10-CM | POA: Diagnosis not present

## 2023-02-27 DIAGNOSIS — B952 Enterococcus as the cause of diseases classified elsewhere: Secondary | ICD-10-CM | POA: Insufficient documentation

## 2023-02-27 DIAGNOSIS — E1122 Type 2 diabetes mellitus with diabetic chronic kidney disease: Secondary | ICD-10-CM | POA: Insufficient documentation

## 2023-02-27 DIAGNOSIS — Z794 Long term (current) use of insulin: Secondary | ICD-10-CM | POA: Insufficient documentation

## 2023-02-27 DIAGNOSIS — E669 Obesity, unspecified: Secondary | ICD-10-CM | POA: Diagnosis not present

## 2023-02-27 DIAGNOSIS — Z79899 Other long term (current) drug therapy: Secondary | ICD-10-CM | POA: Diagnosis not present

## 2023-02-27 DIAGNOSIS — Z23 Encounter for immunization: Secondary | ICD-10-CM | POA: Insufficient documentation

## 2023-02-27 DIAGNOSIS — D509 Iron deficiency anemia, unspecified: Secondary | ICD-10-CM | POA: Insufficient documentation

## 2023-02-27 DIAGNOSIS — N1832 Chronic kidney disease, stage 3b: Secondary | ICD-10-CM | POA: Insufficient documentation

## 2023-02-27 DIAGNOSIS — Z89421 Acquired absence of other right toe(s): Secondary | ICD-10-CM | POA: Insufficient documentation

## 2023-02-27 DIAGNOSIS — R112 Nausea with vomiting, unspecified: Secondary | ICD-10-CM | POA: Diagnosis present

## 2023-02-27 DIAGNOSIS — L97509 Non-pressure chronic ulcer of other part of unspecified foot with unspecified severity: Secondary | ICD-10-CM | POA: Diagnosis not present

## 2023-02-27 DIAGNOSIS — Z7985 Long-term (current) use of injectable non-insulin antidiabetic drugs: Secondary | ICD-10-CM | POA: Diagnosis not present

## 2023-02-27 DIAGNOSIS — E11621 Type 2 diabetes mellitus with foot ulcer: Secondary | ICD-10-CM | POA: Insufficient documentation

## 2023-02-27 DIAGNOSIS — I129 Hypertensive chronic kidney disease with stage 1 through stage 4 chronic kidney disease, or unspecified chronic kidney disease: Secondary | ICD-10-CM | POA: Insufficient documentation

## 2023-02-27 DIAGNOSIS — Z1152 Encounter for screening for COVID-19: Secondary | ICD-10-CM | POA: Diagnosis not present

## 2023-02-27 DIAGNOSIS — Z7984 Long term (current) use of oral hypoglycemic drugs: Secondary | ICD-10-CM | POA: Insufficient documentation

## 2023-02-27 DIAGNOSIS — Z6841 Body Mass Index (BMI) 40.0 and over, adult: Secondary | ICD-10-CM | POA: Insufficient documentation

## 2023-02-27 LAB — CBC
HCT: 20.8 % — ABNORMAL LOW (ref 36.0–46.0)
Hemoglobin: 7.3 g/dL — ABNORMAL LOW (ref 12.0–15.0)
MCH: 27.3 pg (ref 26.0–34.0)
MCHC: 35.1 g/dL (ref 30.0–36.0)
MCV: 77.9 fL — ABNORMAL LOW (ref 80.0–100.0)
Platelets: 247 10*3/uL (ref 150–400)
RBC: 2.67 MIL/uL — ABNORMAL LOW (ref 3.87–5.11)
RDW: 14.3 % (ref 11.5–15.5)
WBC: 9.6 10*3/uL (ref 4.0–10.5)
nRBC: 0 % (ref 0.0–0.2)

## 2023-02-27 LAB — COMPREHENSIVE METABOLIC PANEL
ALT: 9 U/L (ref 0–44)
AST: 10 U/L — ABNORMAL LOW (ref 15–41)
Albumin: 2.9 g/dL — ABNORMAL LOW (ref 3.5–5.0)
Alkaline Phosphatase: 86 U/L (ref 38–126)
Anion gap: 8 (ref 5–15)
BUN: 33 mg/dL — ABNORMAL HIGH (ref 6–20)
CO2: 16 mmol/L — ABNORMAL LOW (ref 22–32)
Calcium: 7.9 mg/dL — ABNORMAL LOW (ref 8.9–10.3)
Chloride: 111 mmol/L (ref 98–111)
Creatinine, Ser: 2.71 mg/dL — ABNORMAL HIGH (ref 0.44–1.00)
GFR, Estimated: 22 mL/min — ABNORMAL LOW (ref >60)
Glucose, Bld: 176 mg/dL — ABNORMAL HIGH (ref 70–99)
Potassium: 4.3 mmol/L (ref 3.5–5.1)
Sodium: 135 mmol/L (ref 135–145)
Total Bilirubin: 0.4 mg/dL (ref 0.3–1.2)
Total Protein: 5.8 g/dL — ABNORMAL LOW (ref 6.5–8.1)

## 2023-02-27 MED ORDER — ONDANSETRON 4 MG PO TBDP
4.0000 mg | ORAL_TABLET | Freq: Once | ORAL | Status: AC | PRN
  Administered 2023-02-27: 4 mg via ORAL
  Filled 2023-02-27: qty 1

## 2023-02-27 NOTE — ED Triage Notes (Signed)
Reports felling nauseated and fatigued since 1530 today. Generalized aches and chills. Reports emesis x1. Denies diarrhea, cp, sob. No changes to urinary habits. No one sick at home. Afebrile.

## 2023-02-28 ENCOUNTER — Observation Stay (HOSPITAL_BASED_OUTPATIENT_CLINIC_OR_DEPARTMENT_OTHER)
Admission: EM | Admit: 2023-02-28 | Discharge: 2023-03-02 | Disposition: A | Discharge status: Home / Self Care | Payer: Medicaid Other | Attending: Internal Medicine | Admitting: Internal Medicine

## 2023-02-28 ENCOUNTER — Observation Stay (HOSPITAL_COMMUNITY): Payer: Medicaid Other

## 2023-02-28 ENCOUNTER — Emergency Department (HOSPITAL_BASED_OUTPATIENT_CLINIC_OR_DEPARTMENT_OTHER): Payer: Medicaid Other

## 2023-02-28 DIAGNOSIS — N179 Acute kidney failure, unspecified: Secondary | ICD-10-CM | POA: Diagnosis not present

## 2023-02-28 DIAGNOSIS — E11621 Type 2 diabetes mellitus with foot ulcer: Secondary | ICD-10-CM | POA: Diagnosis not present

## 2023-02-28 DIAGNOSIS — D509 Iron deficiency anemia, unspecified: Secondary | ICD-10-CM | POA: Diagnosis not present

## 2023-02-28 DIAGNOSIS — E1122 Type 2 diabetes mellitus with diabetic chronic kidney disease: Secondary | ICD-10-CM | POA: Diagnosis not present

## 2023-02-28 DIAGNOSIS — Z7984 Long term (current) use of oral hypoglycemic drugs: Secondary | ICD-10-CM | POA: Diagnosis not present

## 2023-02-28 DIAGNOSIS — Z6841 Body Mass Index (BMI) 40.0 and over, adult: Secondary | ICD-10-CM | POA: Diagnosis not present

## 2023-02-28 DIAGNOSIS — E669 Obesity, unspecified: Secondary | ICD-10-CM | POA: Diagnosis not present

## 2023-02-28 DIAGNOSIS — L97509 Non-pressure chronic ulcer of other part of unspecified foot with unspecified severity: Secondary | ICD-10-CM | POA: Diagnosis not present

## 2023-02-28 DIAGNOSIS — Z794 Long term (current) use of insulin: Secondary | ICD-10-CM

## 2023-02-28 DIAGNOSIS — B952 Enterococcus as the cause of diseases classified elsewhere: Secondary | ICD-10-CM | POA: Diagnosis not present

## 2023-02-28 DIAGNOSIS — E66813 Obesity, class 3: Secondary | ICD-10-CM | POA: Diagnosis present

## 2023-02-28 DIAGNOSIS — I10 Essential (primary) hypertension: Secondary | ICD-10-CM | POA: Diagnosis present

## 2023-02-28 DIAGNOSIS — Z1152 Encounter for screening for COVID-19: Secondary | ICD-10-CM | POA: Diagnosis not present

## 2023-02-28 DIAGNOSIS — R112 Nausea with vomiting, unspecified: Secondary | ICD-10-CM | POA: Diagnosis present

## 2023-02-28 DIAGNOSIS — Z89421 Acquired absence of other right toe(s): Secondary | ICD-10-CM | POA: Diagnosis not present

## 2023-02-28 DIAGNOSIS — D649 Anemia, unspecified: Secondary | ICD-10-CM | POA: Diagnosis present

## 2023-02-28 DIAGNOSIS — Z79899 Other long term (current) drug therapy: Secondary | ICD-10-CM | POA: Diagnosis not present

## 2023-02-28 DIAGNOSIS — N1832 Chronic kidney disease, stage 3b: Secondary | ICD-10-CM | POA: Diagnosis not present

## 2023-02-28 DIAGNOSIS — E785 Hyperlipidemia, unspecified: Secondary | ICD-10-CM | POA: Diagnosis present

## 2023-02-28 DIAGNOSIS — Z7985 Long-term (current) use of injectable non-insulin antidiabetic drugs: Secondary | ICD-10-CM | POA: Diagnosis not present

## 2023-02-28 DIAGNOSIS — E118 Type 2 diabetes mellitus with unspecified complications: Secondary | ICD-10-CM

## 2023-02-28 DIAGNOSIS — Z23 Encounter for immunization: Secondary | ICD-10-CM | POA: Diagnosis not present

## 2023-02-28 DIAGNOSIS — I129 Hypertensive chronic kidney disease with stage 1 through stage 4 chronic kidney disease, or unspecified chronic kidney disease: Secondary | ICD-10-CM | POA: Diagnosis not present

## 2023-02-28 LAB — BASIC METABOLIC PANEL
Anion gap: 6 (ref 5–15)
BUN: 30 mg/dL — ABNORMAL HIGH (ref 6–20)
CO2: 19 mmol/L — ABNORMAL LOW (ref 22–32)
Calcium: 7.6 mg/dL — ABNORMAL LOW (ref 8.9–10.3)
Chloride: 112 mmol/L — ABNORMAL HIGH (ref 98–111)
Creatinine, Ser: 2.6 mg/dL — ABNORMAL HIGH (ref 0.44–1.00)
GFR, Estimated: 23 mL/min — ABNORMAL LOW (ref >60)
Glucose, Bld: 154 mg/dL — ABNORMAL HIGH (ref 70–99)
Potassium: 4.2 mmol/L (ref 3.5–5.1)
Sodium: 137 mmol/L (ref 135–145)

## 2023-02-28 LAB — URINALYSIS, W/ REFLEX TO CULTURE (INFECTION SUSPECTED)
Bilirubin Urine: NEGATIVE
Glucose, UA: 250 mg/dL — AB
Ketones, ur: NEGATIVE mg/dL
Leukocytes,Ua: NEGATIVE
Nitrite: NEGATIVE
Protein, ur: >300 mg/dL — AB
Specific Gravity, Urine: 1.011 (ref 1.005–1.030)
pH: 6.5 (ref 5.0–8.0)

## 2023-02-28 LAB — CULTURE, BLOOD (ROUTINE X 2): Special Requests: ADEQUATE

## 2023-02-28 LAB — HCG, QUANTITATIVE, PREGNANCY: hCG, Beta Chain, Quant, S: 1 m[IU]/mL (ref <5)

## 2023-02-28 LAB — GLUCOSE, CAPILLARY
Glucose-Capillary: 122 mg/dL — ABNORMAL HIGH (ref 70–99)
Glucose-Capillary: 114 mg/dL — ABNORMAL HIGH (ref 70–99)
Glucose-Capillary: 142 mg/dL — ABNORMAL HIGH (ref 70–99)
Glucose-Capillary: 162 mg/dL — ABNORMAL HIGH (ref 70–99)

## 2023-02-28 LAB — LIPASE, BLOOD: Lipase: 24 U/L (ref 11–51)

## 2023-02-28 LAB — HIV ANTIBODY (ROUTINE TESTING W REFLEX): HIV Screen 4th Generation wRfx: NONREACTIVE

## 2023-02-28 LAB — SARS CORONAVIRUS 2 BY RT PCR: SARS Coronavirus 2 by RT PCR: NEGATIVE

## 2023-02-28 MED ORDER — ATORVASTATIN CALCIUM 40 MG PO TABS
40.0000 mg | ORAL_TABLET | Freq: Every day | ORAL | Status: DC
  Administered 2023-03-01 – 2023-03-02 (×2): 40 mg via ORAL
  Filled 2023-02-28 (×2): qty 1

## 2023-02-28 MED ORDER — SODIUM CHLORIDE 0.9 % IV BOLUS
1000.0000 mL | Freq: Once | INTRAVENOUS | Status: AC
  Administered 2023-02-28: 1000 mL via INTRAVENOUS

## 2023-02-28 MED ORDER — FAMOTIDINE 20 MG PO TABS: 20.0000 mg | ORAL_TABLET | Freq: Two times a day (BID) | ORAL | Status: DC | PRN

## 2023-02-28 MED ORDER — ACETAMINOPHEN 500 MG PO TABS
1000.0000 mg | ORAL_TABLET | Freq: Once | ORAL | Status: AC
  Administered 2023-02-28: 1000 mg via ORAL
  Filled 2023-02-28: qty 2

## 2023-02-28 MED ORDER — ONDANSETRON HCL 4 MG/2ML IJ SOLN: 4.0000 mg | Freq: Four times a day (QID) | INTRAMUSCULAR | Status: DC | PRN

## 2023-02-28 MED ORDER — ACETAMINOPHEN 650 MG RE SUPP: 650.0000 mg | Freq: Four times a day (QID) | RECTAL | Status: DC | PRN

## 2023-02-28 MED ORDER — INSULIN GLARGINE-YFGN 100 UNIT/ML ~~LOC~~ SOLN: 15.0000 [IU] | Freq: Every day | SUBCUTANEOUS | Status: DC

## 2023-02-28 MED ORDER — SERTRALINE HCL 50 MG PO TABS
50.0000 mg | ORAL_TABLET | Freq: Every day | ORAL | Status: DC
  Administered 2023-03-01 – 2023-03-02 (×2): 50 mg via ORAL
  Filled 2023-02-28 (×2): qty 1

## 2023-02-28 MED ORDER — ONDANSETRON HCL 4 MG PO TABS: 4.0000 mg | ORAL_TABLET | Freq: Four times a day (QID) | ORAL | Status: DC | PRN

## 2023-02-28 MED ORDER — PROCHLORPERAZINE EDISYLATE 10 MG/2ML IJ SOLN
10.0000 mg | Freq: Once | INTRAMUSCULAR | Status: AC
  Administered 2023-02-28: 10 mg via INTRAVENOUS
  Filled 2023-02-28: qty 2

## 2023-02-28 MED ORDER — PNEUMOCOCCAL 20-VAL CONJ VACC 0.5 ML IM SUSY
0.5000 mL | PREFILLED_SYRINGE | INTRAMUSCULAR | Status: AC
  Administered 2023-03-01: 0.5 mL via INTRAMUSCULAR
  Filled 2023-02-28: qty 0.5

## 2023-02-28 MED ORDER — LABETALOL HCL 5 MG/ML IV SOLN
10.0000 mg | Freq: Once | INTRAVENOUS | Status: AC
  Administered 2023-02-28: 10 mg via INTRAVENOUS
  Filled 2023-02-28: qty 4

## 2023-02-28 MED ORDER — INSULIN ASPART 100 UNIT/ML IJ SOLN: 0.0000 [IU] | Freq: Every day | INTRAMUSCULAR | Status: DC

## 2023-02-28 MED ORDER — AMLODIPINE BESYLATE 5 MG PO TABS
5.0000 mg | ORAL_TABLET | Freq: Every day | ORAL | Status: DC
  Administered 2023-02-28 – 2023-03-02 (×3): 5 mg via ORAL
  Filled 2023-02-28 (×3): qty 1

## 2023-02-28 MED ORDER — TRAZODONE HCL 50 MG PO TABS: 25.0000 mg | ORAL_TABLET | Freq: Every evening | ORAL | Status: DC | PRN

## 2023-02-28 MED ORDER — INSULIN ASPART 100 UNIT/ML IJ SOLN
0.0000 [IU] | Freq: Three times a day (TID) | INTRAMUSCULAR | Status: DC
  Administered 2023-02-28 – 2023-03-01 (×3): 3 [IU] via SUBCUTANEOUS
  Administered 2023-03-02: 2 [IU] via SUBCUTANEOUS

## 2023-02-28 MED ORDER — LACTATED RINGERS IV SOLN: INTRAVENOUS | Status: DC

## 2023-02-28 MED ORDER — PREGABALIN 75 MG PO CAPS
150.0000 mg | ORAL_CAPSULE | Freq: Once | ORAL | Status: AC
  Administered 2023-02-28: 150 mg via ORAL
  Filled 2023-02-28: qty 2

## 2023-02-28 MED ORDER — ALBUTEROL SULFATE (2.5 MG/3ML) 0.083% IN NEBU: 2.5000 mg | INHALATION_SOLUTION | RESPIRATORY_TRACT | Status: DC | PRN

## 2023-02-28 MED ORDER — ACETAMINOPHEN 325 MG PO TABS: 650.0000 mg | ORAL_TABLET | Freq: Four times a day (QID) | ORAL | Status: DC | PRN

## 2023-02-28 NOTE — Plan of Care (Signed)
Received patient from the [facility] ED. Patient is awake, alert orient x4. Denies pain, Remains on RA. Denies nausea and vomiting. Voiding adequate amount of urine. Remains on IV fluids as per MD order. Up ambulating in room. Spouse at bedside. 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 Dr. Kirby Crigler notified. Education provide for foot care. Safety precautions maintained. Problem: Education: Goal: Knowledge of General Education information will improve Description: Including pain rating scale, medication(s)/side effects and non-pharmacologic comfort measures Outcome: Progressing   Problem: Health Behavior/Discharge Planning: Goal: Ability to manage health-related needs will improve Outcome: Progressing   Problem: Clinical Measurements: Goal: Ability to maintain clinical measurements within normal limits will improve Outcome: Progressing Goal: Will remain free from infection Outcome: Progressing Goal: Diagnostic test results will improve Outcome: Progressing Goal: Respiratory complications will improve Outcome: Progressing Goal: Cardiovascular complication will be avoided Outcome: Progressing   Problem: Activity: Goal: Risk for activity intolerance will decrease Outcome: Progressing   Problem: Nutrition: Goal: Adequate nutrition will be maintained Outcome: Progressing   Problem: Coping: Goal: Level of anxiety will decrease Outcome: Progressing   Problem: Elimination: Goal: Will not experience complications related to bowel motility Outcome: Progressing Goal: Will not experience complications related to urinary retention Outcome: Progressing   Problem: Pain Managment: Goal: General experience of comfort will improve Outcome: Progressing   Problem: Safety: Goal: Ability to remain free from injury will improve Outcome: Progressing   Problem: Skin Integrity: Goal: Risk  for impaired skin integrity will decrease Outcome: Progressing   Problem: Education: Goal: Ability to describe self-care measures that may prevent or decrease complications (Diabetes Survival Skills Education) will improve Outcome: Progressing Goal: Individualized Educational Video(s) Outcome: Progressing   Problem: Coping: Goal: Ability to adjust to condition or change in health will improve Outcome: Progressing   Problem: Fluid Volume: Goal: Ability to maintain a balanced intake and output will improve Outcome: Progressing   Problem: Health Behavior/Discharge Planning: Goal: Ability to identify and utilize available resources and services will improve Outcome: Progressing Goal: Ability to manage health-related needs will improve Outcome: Progressing   Problem: Metabolic: Goal: Ability to maintain appropriate glucose levels will improve Outcome: Progressing   Problem: Nutritional: Goal: Maintenance of adequate nutrition will improve Outcome: Progressing Goal: Progress toward achieving an optimal weight will improve Outcome: Progressing   Problem: Skin Integrity: Goal: Risk for impaired skin integrity will decrease Outcome: Progressing   Problem: Tissue Perfusion: Goal: Adequacy of tissue perfusion will improve Outcome: Progressing   Problem: Education: Goal: Knowledge of disease and its progression will improve Outcome: Progressing   Problem: Health Behavior/Discharge Planning: Goal: Ability to manage health-related needs will improve Outcome: Progressing   Problem: Clinical Measurements: Goal: Complications related to the disease process or treatment will be avoided or minimized Outcome: Progressing Goal: Dialysis access will remain free of complications Outcome: Progressing   Problem: Activity: Goal: Activity intolerance will improve Outcome: Progressing   Problem: Fluid Volume: Goal: Fluid volume balance will be maintained or improved Outcome:  Progressing   Problem: Nutritional: Goal: Ability to make appropriate dietary choices will improve Outcome: Progressing   Problem: Respiratory: Goal: Respiratory symptoms related to disease process will be avoided Outcome: Progressing   Problem: Self-Concept: Goal: Body image disturbance will be avoided or minimized Outcome: Progressing   Problem: Urinary Elimination: Goal: Progression of disease will be identified and treated Outcome: Progressing

## 2023-02-28 NOTE — H&P (Signed)
History and Physical  [name] [id number] DOB: [birth year] DOA: 02/28/2023  PCP: [name], Landis Gandy, PA-C   Chief Complaint: N/V   HPI: [name] is a 42 y.o. female with medical history significant for Independent type 2 diabetes, hypertension, hyperlipidemia, CKD stage III with baseline creatinine about 1.3-1.5 being admitted to the hospital with 2 days of vomiting, body aches, reduced oral intake, and acute renal failure.  She presented to [facility] ER in the early morning hours today, complaining of several hours of myalgia with generalized body aches, chills, nausea with nonbloody nonbilious emesis.  No known sick contacts, denies fevers, no recent infections.  No changes in her medications.  Denies abdominal pain, diarrhea.  Denies dysuria, urgency or frequency of urination.  Workup was relatively unremarkable, except for hypertension, and hemoglobin 7.3 she does have a history of iron deficiency anemia due to menstrual losses.  Review of Systems: Please see HPI for pertinent positives and negatives. A complete 10 system review of systems are otherwise negative.  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 She has never used smokeless tobacco. She reports that she does not drink alcohol and does not use drugs.   Allergies  Allergen Reactions   Shellfish Allergy Itching   Magnesium-Containing Compounds Hives   Tramadol Other (See Comments)    Makes patient twitch really bad   Lisinopril Nausea And Vomiting    Family History  Problem Relation Age of Onset    Diabetes Mother    Hypertension Mother    Hyperlipidemia Mother    Cancer Father      Prior to Admission medications   Medication Sig Start Date End Date Taking? Authorizing Provider  amLODipine (NORVASC) 5 MG tablet Take 1 tablet by mouth once daily 10/28/22   Maisie Fus, MD  atorvastatin (LIPITOR) 40 MG tablet Take 40 mg by mouth daily.    [provider]  baclofen (LIORESAL) 20 MG tablet Take 20 mg by mouth at bedtime.    [provider]  celecoxib (CELEBREX) 200 MG capsule Take 200 mg by mouth daily as needed for mild pain.  07/18/20   [provider]  empagliflozin (JARDIANCE) 25 MG TABS tablet Take 25 mg by mouth daily.    [provider]  famotidine (PEPCID) 20 MG tablet Take 20 mg by mouth 2 (two) times daily as needed for heartburn or indigestion.    [provider]  Ferrous Sulfate (IRON) 325 (65 Fe) MG TABS Take 2 tablets every other day. 03/26/21   Molpus, John, MD  furosemide (LASIX) 20 MG tablet Take 2 tablets (40 mg total) by mouth daily. 05/20/21   Mesner, Barbara Cower, MD  insulin degludec (TRESIBA) 100 UNIT/ML FlexTouch Pen Inject 15 Units into the skin daily.    [provider]  lipase/protease/amylase (CREON) 36000 UNITS CPEP capsule Take 72,000-144,000 Units by mouth See admin instructions. Take 4 capsules (144,000 units) with meals & Take 2 capsules (72,000 units)  with snacks 10/29/20   [provider]  ondansetron (ZOFRAN) 8 MG tablet Take 1 tablet (8 mg total) by mouth every 8 (eight) hours as needed for nausea or vomiting. 03/26/21   Molpus, John, MD  polyethylene glycol (MIRALAX / GLYCOLAX) 17 g packet Take 17 g by mouth daily as needed for mild constipation.    [provider]  pregabalin (LYRICA) 150 MG capsule Take 150 mg by mouth 2 (two) times daily. 05/07/20   [provider]  sertraline (ZOLOFT) 50 MG tablet Take 50 mg by mouth daily.    [provider]  TRULICITY 1.5 MG/0.5ML SOPN  Inject 1.5 mg into the skin every Sunday. 03/19/21   [provider]    Physical Exam: BP (!) 152/87 (BP Location: Right Arm)   Pulse 81   Temp 98.2 F (36.8 C) (Oral)   Resp 18   Ht 5\' 3"  (1.6 m)   Wt 112.5 kg   SpO2 95%   BMI 43.93 kg/m   General:  Alert, oriented, calm, in no acute distress  Eyes: EOMI, clear conjuctivae, white sclerea Neck: supple, no masses, trachea mildline  Cardiovascular: RRR, no murmurs or rubs, no peripheral edema  Respiratory: clear to auscultation bilaterally, no wheezes, no crackles  Abdomen: soft, nontender, nondistended, normal bowel tones heard  Skin: dry, no rashes  Musculoskeletal: no joint effusions, normal range of motion  Psychiatric: appropriate affect, normal speech  Neurologic: extraocular muscles intact, clear speech, moving all extremities with intact sensorium          Labs on Admission:  Basic Metabolic Panel: Recent Labs  Lab 02/27/23 2152 02/28/23 0631  NA 135 137  K 4.3 4.2  CL 111 112*  CO2 16* 19*  GLUCOSE 176* 154*  BUN 33* 30*  CREATININE 2.71* 2.60*  CALCIUM 7.9* 7.6*   Liver Function Tests: Recent Labs  Lab 02/27/23 2152  AST 10*  ALT 9  ALKPHOS 86  BILITOT 0.4  PROT 5.8*  ALBUMIN 2.9*   Recent Labs  Lab 02/27/23 2343  LIPASE 24   No results for input(s): "AMMONIA" in the last 168 hours. CBC: Recent Labs  Lab 02/27/23 2152  WBC 9.6  HGB 7.3*  HCT 20.8*  MCV 77.9*  PLT 247   Cardiac Enzymes: No results for input(s): "CKTOTAL", "CKMB", "CKMBINDEX", "TROPONINI" in the last 168 hours.  BNP (last 3 results) No results for input(s): "BNP" in the last 8760 hours.  ProBNP (last 3 results) No results for input(s): "PROBNP" in the last 8760 hours.  CBG: Recent Labs  Lab 02/28/23 0906  GLUCAP 122*    Radiological Exams on Admission: CT Renal Stone Study  Result Date: 02/28/2023 CLINICAL DATA:  Abdominal/flank pain with stone suspected EXAM: CT ABDOMEN AND PELVIS WITHOUT CONTRAST  TECHNIQUE: Multidetector CT imaging of the abdomen and pelvis was performed following the standard protocol without IV contrast. RADIATION DOSE REDUCTION: This exam was performed according to the departmental dose-optimization program which includes automated exposure control, adjustment of the mA and/or kV according to patient size and/or use of iterative reconstruction technique. COMPARISON:  03/07/2021 FINDINGS: Lower chest:  No contributory findings. Hepatobiliary: No focal liver abnormality.No evidence of biliary obstruction or stone. Pancreas: Unremarkable. Spleen: Unremarkable. Adrenals/Urinary Tract: Negative adrenals. No hydronephrosis or stone. Nonspecific perinephric stranding which is symmetric. Unremarkable bladder. Stomach/Bowel:  No obstruction. No appendicitis. Vascular/Lymphatic: No acute vascular abnormality. Atheromatous calcification of the aorta and iliacs to a mild degree. No mass or adenopathy. Reproductive:No pathologic findings. Other:  No ascites or pneumoperitoneum. Musculoskeletal: No acute abnormalities. IMPRESSION: No acute finding.  No hydronephrosis or ureteral stone. Electronically Signed   By: Tiburcio Pea M.D.   On: 02/28/2023 04:28    Assessment/Plan 31F with history of CKD3, insulin dependant DM2, HTN, HLD, iron deficiency anemia admitted with one day of vomiting, body aches found to have AKI likely due to dehydration. - observation admission - IV fluids - labs and imaging without UTI, sepsis, or obstruction/retention of urine - avoid nephrotoxins and recheck renal function in AM after continued hydration  DM2 - carb control diet with SSI  CKD3 - presumably due to DM, baseline Cr 1.3-1.5  Iron deficiency Anemia - no bleeding, no indication for pRBC, follow up with Hematology  HTN - resume home Norvasc  HLD - home statin  DVT prophylaxis: SCDs     Code Status: Full Code  Consults called: None  Admission status: Observation   Time spent: 42  minutes  Maalle Starrett Sharlette Dense MD [facility] Pager [phone]  If 7PM-7AM, please contact night-coverage [email or web address] Password [email or web address]  02/28/2023, 10:11 AM

## 2023-02-28 NOTE — ED Provider Notes (Signed)
Signout from Dr. Eudelia Bunch.  She has a history of diabetes, CKD.  She is having nausea vomiting poor p.o. intake.  Lab work showing acute AKI.  She had been discussed with hospitalist for admission earlier this evening and they declined, recommended continued hydration and recheck BMP.  Given fluids and BMP marginally improved.  Will review with hospitalist regarding possible admission for continued hydration and management of AKI. Physical Exam  BP (!) 153/71   Pulse 93   Temp 97.8 F (36.6 C)   Resp 18   Ht 5\' 3"  (1.6 m)   Wt 112.5 kg   SpO2 96%   BMI 43.93 kg/m   Physical Exam  Procedures  Procedures  ED Course / MDM   Clinical Course as of 02/28/23 0714  Wynelle Link Feb 28, 2023  0631 Consulted and spoke with Dr. Arville Care from the hospitalist service who reviewed the patient's chart.  He recommended repeating BMP to reassess patient's renal function.  They have improved and patient has tolerating p.o. intake, he feels she may not require admission to the hospital.  If there is minimal improvement or if patient is not tolerating p.o. intake the hospitalist would admit.  I discussed this with the patient who felt comfortable with the plan.  BMP ordered [PC]    Clinical Course User Index [PC] Cardama, Amadeo Garnet, MD   Medical Decision Making Amount and/or Complexity of Data Reviewed Labs: ordered. Radiology: ordered.  Risk OTC drugs. Prescription drug management. Decision regarding hospitalization.   Discussed with [facility] hospitalist Dr. Erenest Blank.  He is accepting the patient for admission to [facility].       Terrilee Files, MD 02/28/23 [phone]

## 2023-02-28 NOTE — ED Notes (Signed)
Called [facility] -- informed that pt bed assignment is ready 

## 2023-02-28 NOTE — ED Provider Notes (Addendum)
[place] EMERGENCY DEPARTMENT AT [facility] Provider Note  CSN: [id number] Arrival date & time: 02/27/23 2125  Chief Complaint(s) Emesis  HPI [name] is a 42 y.o. female with past medical history listed below who presents to the emergency department with several hours of myalgias with generalized bodyaches, chills, nausea with nonbloody nonbilious emesis.  She denies any known sick contacts.  No fevers.  No recent infections.  No associated chest pain or shortness of breath.  No abdominal pain.  No diarrhea.  No dysuria.  The history is provided by the patient.    Past Medical History Past Medical History:  Diagnosis Date   Anemia    Carpal tunnel syndrome    Depression    Diabetes mellitus without complication (HCC)    Diabetic neuropathy (HCC)    Hemoglobin C trait (HCC)    High cholesterol    Patient Active Problem List   Diagnosis Date Noted   Dyspnea 07/16/2021   Type 2 diabetes mellitus with complication, with long-term current use of insulin (HCC) 07/16/2021   Symptomatic anemia 04/01/2021   Home Medication(s) Prior to Admission medications   Medication Sig Start Date End Date Taking? Authorizing Provider  amLODipine (NORVASC) 5 MG tablet Take 1 tablet by mouth once daily 10/28/22   Maisie Fus, MD  atorvastatin (LIPITOR) 40 MG tablet Take 40 mg by mouth daily.    [provider]  baclofen (LIORESAL) 20 MG tablet Take 20 mg by mouth at bedtime.    [provider]  celecoxib (CELEBREX) 200 MG capsule Take 200 mg by mouth daily as needed for mild pain.  07/18/20   [provider]  empagliflozin (JARDIANCE) 25 MG TABS tablet Take 25 mg by mouth daily.    [provider]  famotidine (PEPCID) 20 MG tablet Take 20 mg by mouth 2 (two) times daily as needed for heartburn or indigestion.    [provider]  Ferrous Sulfate (IRON) 325 (65 Fe) MG TABS Take 2 tablets every other day. 03/26/21   Molpus, John, MD   furosemide (LASIX) 20 MG tablet Take 2 tablets (40 mg total) by mouth daily. 05/20/21   Mesner, Barbara Cower, MD  insulin degludec (TRESIBA) 100 UNIT/ML FlexTouch Pen Inject 15 Units into the skin daily.    [provider]  lipase/protease/amylase (CREON) 36000 UNITS CPEP capsule Take 72,000-144,000 Units by mouth See admin instructions. Take 4 capsules (144,000 units) with meals & Take 2 capsules (72,000 units) with snacks 10/29/20   [provider]  ondansetron (ZOFRAN) 8 MG tablet Take 1 tablet (8 mg total) by mouth every 8 (eight) hours as needed for nausea or vomiting. 03/26/21   Molpus, John, MD  polyethylene glycol (MIRALAX / GLYCOLAX) 17 g packet Take 17 g by mouth daily as needed for mild constipation.    [provider]  pregabalin (LYRICA) 150 MG capsule Take 150 mg by mouth 2 (two) times daily. 05/07/20   [provider]  sertraline (ZOLOFT) 50 MG tablet Take 50 mg by mouth daily.    [provider]  TRULICITY 1.5 MG/0.5ML SOPN Inject 1.5 mg into the skin every Sunday. 03/19/21   [provider]  Allergies Shellfish allergy, Magnesium-containing compounds, Tramadol, and Lisinopril  Review of Systems Review of Systems As noted in HPI  Physical Exam Vital Signs  I have reviewed the triage vital signs BP (!) 185/95   Pulse 92   Temp 97.8 F (36.6 C)   Resp 17   Ht 5\' 3"  (1.6 m)   Wt 112.5 kg   SpO2 95%   BMI 43.93 kg/m   Physical Exam Vitals reviewed.  Constitutional:      General: She is not in acute distress.    Appearance: She is well-developed. She is not diaphoretic.  HENT:     Head: Normocephalic and atraumatic.     Nose: Nose normal.  Eyes:     General: No scleral icterus.       Right eye: No discharge.        Left eye: No discharge.     Conjunctiva/sclera: Conjunctivae normal.     Pupils:  Pupils are equal, round, and reactive to light.  Cardiovascular:     Rate and Rhythm: Regular rhythm.     Heart sounds: No murmur heard.    No friction rub. No gallop.  Pulmonary:     Effort: Pulmonary effort is normal. No respiratory distress.     Breath sounds: Normal breath sounds. No stridor. No rales.  Abdominal:     General: There is no distension.     Palpations: Abdomen is soft.     Tenderness: There is abdominal tenderness in the left upper quadrant. There is no guarding or rebound. Negative signs include Murphy's sign and McBurney's sign.  Musculoskeletal:        General: No tenderness.     Cervical back: Normal range of motion and neck supple.     Right lower leg: 1+ Pitting Edema present.     Left lower leg: 1+ Pitting Edema present.  Skin:    General: Skin is warm and dry.     Findings: No erythema or rash.  Neurological:     Mental Status: She is alert and oriented to person, place, and time.     ED Results and Treatments Labs (all labs ordered are listed, but only abnormal results are displayed) Labs Reviewed  COMPREHENSIVE METABOLIC PANEL - Abnormal; Notable for the following components:      Result Value   CO2 16 (*)    Glucose, Bld 176 (*)    BUN 33 (*)    Creatinine, Ser 2.71 (*)    Calcium 7.9 (*)    Total Protein 5.8 (*)    Albumin 2.9 (*)    AST 10 (*)    GFR, Estimated 22 (*)    All other components within normal limits  CBC - Abnormal; Notable for the following components:   RBC 2.67 (*)    Hemoglobin 7.3 (*)    HCT 20.8 (*)    MCV 77.9 (*)    All other components within normal limits  URINALYSIS, W/ REFLEX TO CULTURE (INFECTION SUSPECTED) - Abnormal; Notable for the following components:   Glucose, UA 250 (*)    Hgb urine dipstick SMALL (*)    Protein, ur >300 (*)    Bacteria, UA RARE (*)    All other components within normal limits  URINE CULTURE  LIPASE, BLOOD  HCG, QUANTITATIVE, PREGNANCY  EKG  EKG Interpretation  Date/Time:  Sunday February 28 2023 00:29:29 EDT Ventricular Rate:  97 PR Interval:  169 QRS Duration: 85 QT Interval:  348 QTC Calculation: 442 R Axis:   52 Text Interpretation: Sinus rhythm Probable left atrial enlargement Low voltage, precordial leads Otherwise no significant change Confirmed by Drema Pry [phone]) on 02/28/2023 3:42:07 AM       Radiology CT Renal Stone Study  Result Date: 02/28/2023 CLINICAL DATA:  Abdominal/flank pain with stone suspected EXAM: CT ABDOMEN AND PELVIS WITHOUT CONTRAST TECHNIQUE: Multidetector CT imaging of the abdomen and pelvis was performed following the standard protocol without IV contrast. RADIATION DOSE REDUCTION: This exam was performed according to the departmental dose-optimization program which includes automated exposure control, adjustment of the mA and/or kV according to patient size and/or use of iterative reconstruction technique. COMPARISON:  03/07/2021 FINDINGS: Lower chest:  No contributory findings. Hepatobiliary: No focal liver abnormality.No evidence of biliary obstruction or stone. Pancreas: Unremarkable. Spleen: Unremarkable. Adrenals/Urinary Tract: Negative adrenals. No hydronephrosis or stone. Nonspecific perinephric stranding which is symmetric. Unremarkable bladder. Stomach/Bowel:  No obstruction. No appendicitis. Vascular/Lymphatic: No acute vascular abnormality. Atheromatous calcification of the aorta and iliacs to a mild degree. No mass or adenopathy. Reproductive:No pathologic findings. Other: No ascites or pneumoperitoneum. Musculoskeletal: No acute abnormalities. IMPRESSION: No acute finding.  No hydronephrosis or ureteral stone. Electronically Signed   By: Tiburcio Pea M.D.   On: 02/28/2023 04:28    Medications Ordered in ED Medications  ondansetron (ZOFRAN-ODT) disintegrating tablet 4 mg (4 mg Oral Given 02/27/23 2157)  sodium  chloride 0.9 % bolus 1,000 mL (0 mLs Intravenous Stopped 02/28/23 0526)  prochlorperazine (COMPAZINE) injection 10 mg (10 mg Intravenous Given 02/28/23 0344)  acetaminophen (TYLENOL) tablet 1,000 mg (1,000 mg Oral Given 02/28/23 0510)   Procedures Procedures  (including critical care time) Medical Decision Making / ED Course   Medical Decision Making Amount and/or Complexity of Data Reviewed Labs: ordered. Decision-making details documented in ED Course. Radiology: ordered and independent interpretation performed. Decision-making details documented in ED Course. ECG/medicine tests: ordered and independent interpretation performed. Decision-making details documented in ED Course.  Risk OTC drugs. Prescription drug management. Decision regarding hospitalization.    Patient presents with generalized body aches, chills with nausea and nonbloody nonbilious emesis  Differential includes gastroenteritis, biliary disease, pancreatitis, gastroparesis, renal colic, UTI, pregnancy related process, bowel obstruction.  Will also assess for any electrolyte or metabolic derangements.  Will check for renal sufficiency.  CBC without leukocytosis.  Anemia with a hemoglobin of 7.3 which is down from 9.8 five months ago. CMP without significant electrolyte derangements, hyperglycemia without DKA.  Renal insufficiency with AKI.  Creatinine of 2.7 which is up from 1.9 two months ago.  No evidence of bili obstruction or pancreatitis. UA without evidence of infection. hCG negative. CT renal study negative for stones, hydronephrosis.  No other intra-abdominal inflammatory/infectious process or bowel obstruction noted.  Patient provided with IV fluids, antiemetics and Tylenol. Etiology of AKI undetermined at this time.  Patient has been taken off of lisinopril, Lasix, and NSAIDs due to renal insufficiency noted on clinic notes from PCPs.  Will discuss with hospitalist regarding admission for AKI  Clinical  Course as of 02/28/23 3244  Wynelle Link Feb 28, 2023  0631 Consulted and spoke with Dr. Arville Care from the hospitalist service who reviewed the patient's chart.  He recommended repeating BMP to reassess patient's renal function.  They have improved and patient has tolerating p.o. intake, he feels she may not  require admission to the hospital.  If there is minimal improvement or if patient is not tolerating p.o. intake the hospitalist would admit.  I discussed this with the patient who felt comfortable with the plan.  BMP ordered [PC]    Clinical Course User Index [PC] Raliyah Montella, Amadeo Garnet, MD   Patient care turned over to oncoming provider. Patient case and results discussed in detail; please see their note for further ED managment.    Final Clinical Impression(s) / ED Diagnoses Final diagnoses:  AKI (acute kidney injury) (HCC)    This chart was dictated using voice recognition software.  Despite best efforts to proofread,  errors can occur which can change the documentation meaning.      Nira Conn, MD 02/28/23 [phone]

## 2023-03-01 ENCOUNTER — Encounter (HOSPITAL_COMMUNITY): Payer: Self-pay | Admitting: Internal Medicine

## 2023-03-01 DIAGNOSIS — N179 Acute kidney failure, unspecified: Secondary | ICD-10-CM | POA: Diagnosis not present

## 2023-03-01 DIAGNOSIS — E11621 Type 2 diabetes mellitus with foot ulcer: Secondary | ICD-10-CM | POA: Diagnosis present

## 2023-03-01 DIAGNOSIS — N189 Chronic kidney disease, unspecified: Secondary | ICD-10-CM

## 2023-03-01 DIAGNOSIS — E66813 Obesity, class 3: Secondary | ICD-10-CM | POA: Diagnosis present

## 2023-03-01 DIAGNOSIS — E785 Hyperlipidemia, unspecified: Secondary | ICD-10-CM | POA: Diagnosis present

## 2023-03-01 DIAGNOSIS — I10 Essential (primary) hypertension: Secondary | ICD-10-CM | POA: Diagnosis present

## 2023-03-01 LAB — BASIC METABOLIC PANEL
Anion gap: 7 (ref 5–15)
BUN: 28 mg/dL — ABNORMAL HIGH (ref 6–20)
CO2: 19 mmol/L — ABNORMAL LOW (ref 22–32)
Calcium: 7.7 mg/dL — ABNORMAL LOW (ref 8.9–10.3)
Chloride: 113 mmol/L — ABNORMAL HIGH (ref 98–111)
Creatinine, Ser: 2.34 mg/dL — ABNORMAL HIGH (ref 0.44–1.00)
GFR, Estimated: 26 mL/min — ABNORMAL LOW (ref >60)
Glucose, Bld: 192 mg/dL — ABNORMAL HIGH (ref 70–99)
Potassium: 4.2 mmol/L (ref 3.5–5.1)
Sodium: 139 mmol/L (ref 135–145)

## 2023-03-01 LAB — CBC
HCT: 19.9 % — ABNORMAL LOW (ref 36.0–46.0)
Hemoglobin: 6.7 g/dL — CL (ref 12.0–15.0)
MCH: 27.2 pg (ref 26.0–34.0)
MCHC: 33.7 g/dL (ref 30.0–36.0)
MCV: 80.9 fL (ref 80.0–100.0)
Platelets: 221 10*3/uL (ref 150–400)
RBC: 2.46 MIL/uL — ABNORMAL LOW (ref 3.87–5.11)
RDW: 14.1 % (ref 11.5–15.5)
WBC: 4.6 10*3/uL (ref 4.0–10.5)
nRBC: 0 % (ref 0.0–0.2)

## 2023-03-01 LAB — URINE CULTURE: Culture: >=100000 — AB

## 2023-03-01 LAB — HEMOGLOBIN A1C
Hgb A1c MFr Bld: 7.7 % — ABNORMAL HIGH (ref 4.8–5.6)
Mean Plasma Glucose: 174 mg/dL

## 2023-03-01 LAB — GLUCOSE, CAPILLARY
Glucose-Capillary: 166 mg/dL — ABNORMAL HIGH (ref 70–99)
Glucose-Capillary: 200 mg/dL — ABNORMAL HIGH (ref 70–99)
Glucose-Capillary: 90 mg/dL (ref 70–99)
Glucose-Capillary: 135 mg/dL — ABNORMAL HIGH (ref 70–99)

## 2023-03-01 LAB — CULTURE, BLOOD (ROUTINE X 2)
Culture: NO GROWTH
Culture: NO GROWTH

## 2023-03-01 LAB — TYPE AND SCREEN
ABO/RH(D): AB POS
Donor AG Type: NEGATIVE

## 2023-03-01 LAB — BPAM RBC: Unit Type and Rh: 9500

## 2023-03-01 LAB — PREPARE RBC (CROSSMATCH)

## 2023-03-01 MED ORDER — SODIUM CHLORIDE 0.9% IV SOLUTION: Freq: Once | INTRAVENOUS | Status: DC

## 2023-03-01 MED ORDER — FERROUS SULFATE 325 (65 FE) MG PO TABS
325.0000 mg | ORAL_TABLET | Freq: Two times a day (BID) | ORAL | Status: DC
  Administered 2023-03-01 – 2023-03-02 (×2): 325 mg via ORAL
  Filled 2023-03-01 (×2): qty 1

## 2023-03-01 MED ORDER — INSULIN GLARGINE-YFGN 100 UNIT/ML ~~LOC~~ SOLN
15.0000 [IU] | Freq: Every day | SUBCUTANEOUS | Status: DC
  Administered 2023-03-02: 15 [IU] via SUBCUTANEOUS
  Filled 2023-03-01 (×2): qty 0.15

## 2023-03-01 MED ORDER — MUPIROCIN CALCIUM 2 % EX CREA: TOPICAL_CREAM | Freq: Every day | CUTANEOUS | 0 refills | Status: DC

## 2023-03-01 MED ORDER — MUPIROCIN CALCIUM 2 % EX CREA
TOPICAL_CREAM | Freq: Every day | CUTANEOUS | Status: DC
  Filled 2023-03-01: qty 15

## 2023-03-01 MED ORDER — AMLODIPINE BESYLATE 5 MG PO TABS
5.0000 mg | ORAL_TABLET | ORAL | Status: AC
  Administered 2023-03-01: 5 mg via ORAL
  Filled 2023-03-01: qty 1

## 2023-03-01 MED ORDER — AMLODIPINE BESYLATE 5 MG PO TABS: 5.0000 mg | ORAL_TABLET | Freq: Every day | ORAL | Status: DC

## 2023-03-01 MED ORDER — FERROUS SULFATE 325 (65 FE) MG PO TABS: 325.0000 mg | ORAL_TABLET | Freq: Two times a day (BID) | ORAL | 3 refills | Status: AC

## 2023-03-01 NOTE — Discharge Summary (Addendum)
Physician Discharge Summary   Patient: [name] MRN: [id number] DOB: October 21, 1980  Admit date:     02/28/2023  Discharge date: 03/01/23  Discharge Physician: Jonah Blue   PCP: [name], Landis Gandy, PA-C   Recommendations at discharge:   Hold Jardiance and losartan until after PCP visit (or until PCP advises you to restart) Follow up with PCP on Thursday, 6/13, as scheduled; you will need repeat CBC and BMP at that time Start taking iron twice daily Follow up with PCP/GYN to discuss Mirena IUD Follow up with hematology regarding anemia Follow up with PCP and continue to closely monitor foot ulcer  Discharge Diagnoses: Principal Problem:   Acute kidney injury superimposed on chronic kidney disease (HCC) Active Problems:   Symptomatic anemia   Type 2 diabetes mellitus with complication, with long-term current use of insulin (HCC)   Essential hypertension   Dyslipidemia   Obesity, Class III, BMI 40-49.9 (morbid obesity) (HCC)   Diabetic foot ulcer [facility])    Hospital Course: 41yo with h/o DM, HTN, HLD, and stage 3 CKD who presented on 6/9 with n/v.  She was found to have AKI that was thought to be related to dehydration with otherwise unremarkable evaluation.  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 -Patient with baseline compromised renal function, creatinine 1.81 in 09/2022  -Current creatinine is increased at least 1.5 times compared to baseline and presumed to have occurred within the last 7 days -Likely due to prerenal secondary to severe dehydration in the setting of n/v -IVF -Avoid ACEI and NSAIDs  -Observed overnight and feeling much better today, wants to go home -Labs and imaging without UTI, sepsis, or obstruction/retention of urine -She has f/u PCP appointment already scheduled for Thursday (6/13) - recommend f/u CBC, BMP  Iron deficiency Anemia  -Patient reports long-standing anemia from menses -She is not currently bleeding -She does not desire future  fertility -Suggest consideration of Mirena, encouraged to d/w her GYN or PCP -Hgb 6.7 today, transfusing 1 unit PRBC prior to dc -Follow up with Hematology, Dr. Serafina Royals    Diabetes  -A1c 7.7, suboptimal control -Hold Jardiance until after PCP visit -Resume Tresiba  Diabetic foot ulcer -Wound care consulted -Negative xray -Keep wound clean and dry -Follow up with PCP and continue to closely monitor   HTN  -Resume home Norvasc -Hold Jardiance, Losartan until PCP advises you to restart   Obesity -Body mass index is 43.93 kg/m..  -Weight loss should be encouraged -Outpatient PCP/bariatric medicine/bariatric surgery f/u encouraged     Consultants: wound care, TOC team Procedures performed: None  Disposition: Home Diet recommendation:  Carb modified diet DISCHARGE MEDICATION: Allergies as of 03/01/2023       Reactions   Shellfish Allergy Itching   Magnesium-containing Compounds Hives   Tramadol Other (See Comments)   Makes patient twitch really bad   Lisinopril Nausea And Vomiting        Medication List     STOP taking these medications    empagliflozin 25 MG Tabs tablet Commonly known as: JARDIANCE   losartan 25 MG tablet Commonly known as: COZAAR   pregabalin 150 MG capsule Commonly known as: LYRICA       TAKE these medications    amLODipine 5 MG tablet Commonly known as: NORVASC Take 1 tablet by mouth once daily   ferrous sulfate 325 (65 FE) MG tablet Take 1 tablet (325 mg total) by mouth 2 (two) times daily with a meal. What changed:  medication strength how  much to take how to take this when to take this additional instructions   insulin degludec 100 UNIT/ML FlexTouch Pen Commonly known as: TRESIBA Inject 15 Units into the skin daily.   mupirocin cream 2 % Commonly known as: BACTROBAN Apply topically daily. Start taking on: March 02, 2023   ondansetron 8 MG tablet Commonly known as: Zofran Take 1 tablet (8 mg total) by mouth  every 8 (eight) hours as needed for nausea or vomiting.               Discharge Care Instructions  (From admission, onward)           Start     Ordered   03/01/23 0000  Discharge wound care:       Comments: Apply Bactroban to right plantar foot wound Q day, then cover with foam dressing.  Change foam dressing Q 3 days or PRN soiling.   03/01/23 1305            Discharge Exam: Filed Weights   02/27/23 2148  Weight: 112.5 kg   Subjective: She reports that n/v have resolved, she feels well and wishes to go home.  She does have h/o IDA and menorrhagia, has not been taking medications for this recently.  Her menses is due soon.  She has required transfusion before and is currently awaiting transfusion prior to dc.    Physical Exam:       Vitals:    02/28/23 1228 02/28/23 1719 02/28/23 2026 03/01/23 0523  BP: (!) 163/86 (!) 165/99 (!) 168/79 (!) 165/84  Pulse: 79 82 84 80  Resp: 19 18 18 16   Temp: 98.2 F (36.8 C) 98.5 F (36.9 C) 98.7 F (37.1 C) 98.5 F (36.9 C)  TempSrc: Oral Oral Oral    SpO2: 100% 100% 100% 99%  Weight:          Height:            General:  Appears calm and comfortable and is in NAD Eyes:  EOMI, normal lids, iris ENT:  grossly normal hearing, lips & tongue, mmm Neck:  no LAD, masses or thyromegaly Cardiovascular:  RRR, no m/r/g. No LE edema.  Respiratory:   CTA bilaterally with no wheezes/rales/rhonchi.  Normal respiratory effort. Abdomen:  soft, NT, ND, obese Skin:  no rash or induration seen on limited exam Musculoskeletal:  grossly normal tone BUE/BLE, good ROM, no bony abnormality Psychiatric:  grossly normal mood and affect, speech fluent and appropriate, AOx3 Neurologic:  CN 2-12 grossly intact, moves all extremities in coordinated fashion        EKG: Independently reviewed.  NSR with rate 97; no evidence of acute ischemia     Labs on Admission: I have personally reviewed the available labs and imaging studies at the time of  the admission.   Pertinent labs:     CO2 19 Glucose 192 BUN 28/Creatinine 2.34/GFR 26; 30/2.6/23 on 6/9; 33/2.71/22 on 6/8; 29/1.81 on 1/8 WBC 4.6 Hgb 6.7     Family Communication: Family member was present throughout evaluation  Condition at discharge: stable  The results of significant diagnostics from this hospitalization (including imaging, microbiology, ancillary and laboratory) are listed below for reference.   Imaging Studies: DG Foot 2 Views Right  Result Date: 02/28/2023 CLINICAL DATA:  Heel ulcer. EXAM: RIGHT FOOT - 2 VIEW COMPARISON:  None Available. FINDINGS: No radiographic findings of soft tissue ulcer. No soft tissue gas or radiopaque foreign body. Prior resection of the second toe. No  fracture. No erosion, periostitis or bone destruction. Generalized subcutaneous edema throughout the foot. IMPRESSION: 1. Generalized subcutaneous edema throughout the foot. No soft tissue gas or radiopaque foreign body. No radiographic findings of osteomyelitis. 2. Prior resection of the second toe. Electronically Signed   By: Narda Rutherford M.D.   On: 02/28/2023 20:16   CT Renal Stone Study  Result Date: 02/28/2023 CLINICAL DATA:  Abdominal/flank pain with stone suspected EXAM: CT ABDOMEN AND PELVIS WITHOUT CONTRAST TECHNIQUE: Multidetector CT imaging of the abdomen and pelvis was performed following the standard protocol without IV contrast. RADIATION DOSE REDUCTION: This exam was performed according to the departmental dose-optimization program which includes automated exposure control, adjustment of the mA and/or kV according to patient size and/or use of iterative reconstruction technique. COMPARISON:  03/07/2021 FINDINGS: Lower chest:  No contributory findings. Hepatobiliary: No focal liver abnormality.No evidence of biliary obstruction or stone. Pancreas: Unremarkable. Spleen: Unremarkable. Adrenals/Urinary Tract: Negative adrenals. No hydronephrosis or stone. Nonspecific perinephric  stranding which is symmetric. Unremarkable bladder. Stomach/Bowel:  No obstruction. No appendicitis. Vascular/Lymphatic: No acute vascular abnormality. Atheromatous calcification of the aorta and iliacs to a mild degree. No mass or adenopathy. Reproductive:No pathologic findings. Other: No ascites or pneumoperitoneum. Musculoskeletal: No acute abnormalities. IMPRESSION: No acute finding.  No hydronephrosis or ureteral stone. Electronically Signed   By: Tiburcio Pea M.D.   On: 02/28/2023 04:28    Microbiology: Results for orders placed or performed during the hospital encounter of 02/28/23  SARS Coronavirus 2 by RT PCR (hospital order, performed in [facility] hospital lab) *cepheid single result test* Anterior Nasal Swab     Status: None   Collection Time: 02/28/23  7:33 AM   Specimen: Anterior Nasal Swab  Result Value Ref Range Status   SARS Coronavirus 2 by RT PCR NEGATIVE NEGATIVE Final    Comment: (NOTE) SARS-CoV-2 target nucleic acids are NOT DETECTED.  The SARS-CoV-2 RNA is generally detectable in upper and lower respiratory specimens during the acute phase of infection. The lowest concentration of SARS-CoV-2 viral copies this assay can detect is 250 copies / mL. A negative result does not preclude SARS-CoV-2 infection and should not be used as the sole basis for treatment or other patient management decisions.  A negative result may occur with improper specimen collection / handling, submission of specimen other than nasopharyngeal swab, presence of viral mutation(s) within the areas targeted by this assay, and inadequate number of viral copies (<250 copies / mL). A negative result must be combined with clinical observations, patient history, and epidemiological information.  Fact Sheet for Patients:   RoadLapTop.co.za  Fact Sheet for Healthcare Providers: http://kim-miller.com/  This test is not yet approved or  cleared by the  [place] FDA and has been authorized for detection and/or diagnosis of SARS-CoV-2 by FDA under an Emergency Use Authorization (EUA).  This EUA will remain in effect (meaning this test can be used) for the duration of the COVID-19 declaration under Section 564(b)(1) of the Act, 21 U.S.C. section 360bbb-3(b)(1), unless the authorization is terminated or revoked sooner.  Performed at [facility], [address] [place], [place] [address]   Culture, blood (Routine X 2) w Reflex to ID Panel     Status: None (Preliminary result)   Collection Time: 02/28/23  4:58 PM   Specimen: BLOOD RIGHT HAND  Result Value Ref Range Status   Specimen Description   Final    BLOOD RIGHT HAND Performed at [facility], [address], [place],  [place] [address]    Special Requests   Final    BOTTLES DRAWN AEROBIC ONLY Blood Culture adequate volume Performed at [facility], [address] [place], [place] [address]    Culture   Final    NO GROWTH < 12 HOURS Performed at [facility], [address], [place], [place] [address]    Report Status PENDING  Incomplete  Culture, blood (Routine X 2) w Reflex to ID Panel     Status: None (Preliminary result)   Collection Time: 02/28/23  4:58 PM   Specimen: BLOOD LEFT HAND  Result Value Ref Range Status   Specimen Description   Final    BLOOD LEFT HAND Performed at [facility], [address], [place], [place] [address]    Special Requests   Final    BOTTLES DRAWN AEROBIC ONLY Blood Culture adequate volume Performed at [facility], [address] [place], [place] [address]    Culture   Final    NO GROWTH < 12 HOURS Performed at [facility], [address], [place], [place] [address]    Report Status PENDING  Incomplete     Discharge time spent: greater than 30 minutes.  Signed: Jonah Blue, MD [facility] 03/01/2023

## 2023-03-01 NOTE — TOC CM/SW Note (Signed)
Transition of Care [facility]) - Inpatient Brief Assessment   Patient Details  Name: Sheika Coutts MRN: [id number] Date of Birth: March 13, 1981  Transition of Care [facility]) CM/SW Contact:    Otelia Santee, LCSW Phone Number: 03/01/2023, 12:10 PM   Clinical Narrative: No TOC needs identified.    Transition of Care Asessment: Insurance and Status: Insurance coverage has been reviewed Patient has primary care physician: Yes Home environment has been reviewed: Apartment Prior level of function:: Independent Prior/Current Home Services: No current home services Social Determinants of Health Reivew: SDOH reviewed no interventions necessary Readmission risk has been reviewed: No (NA) Transition of care needs: no transition of care needs at this time

## 2023-03-01 NOTE — Hospital Course (Signed)
41yo with h/o DM, HTN, HLD, and stage 3 CKD who presented on 6/9 with n/v.  She was found to have AKI that was thought to be related to dehydration with otherwise unremarkable evaluation.

## 2023-03-01 NOTE — Progress Notes (Signed)
       Overnight   NAME: [name] MRN: [id number] DOB : 20-Jul-1981    Date of Service   03/01/2023   HPI/Events of Note    Notified by RN for HTN >200 sbp BP of 205/106 (133) verified in both arms and cuff size correct. Obtained new reading after 30-45 minutes - BP still elevated at  182/92 (115)  No obvious or stated distress, patient agrees to stay overnight for additional BP control; as needed    Interventions/ Plan   PRN BP control as needed Cancel discharge for tonight        Chinita Greenland BSN MSNA MSN ACNPC-AG Acute Care Nurse Practitioner [facility]

## 2023-03-01 NOTE — Consult Note (Signed)
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  Topical treatment orders provided for bedside nurses to perform as follows to promote moist healing: Apply Bactroban to right plantar foot wound Q day, then cover with foam dressing.  Change foam dressing Q 3 days or PRN soiling.  Please re-consult if further assistance is needed.  Thank-you,  Cammie Mcgee MSN, RN, CWOCN, [place], CNS [phone]

## 2023-03-02 ENCOUNTER — Encounter (HOSPITAL_COMMUNITY): Payer: Self-pay | Admitting: Internal Medicine

## 2023-03-02 ENCOUNTER — Other Ambulatory Visit (HOSPITAL_COMMUNITY): Payer: Self-pay

## 2023-03-02 LAB — TYPE AND SCREEN
Antibody Screen: NEGATIVE
Unit division: 0

## 2023-03-02 LAB — CBC WITH DIFFERENTIAL/PLATELET
Abs Immature Granulocytes: 0.01 10*3/uL (ref 0.00–0.07)
Basophils Absolute: 0.1 10*3/uL (ref 0.0–0.1)
Basophils Relative: 1 %
Eosinophils Absolute: 0.3 10*3/uL (ref 0.0–0.5)
Eosinophils Relative: 5 %
HCT: 28.1 % — ABNORMAL LOW (ref 36.0–46.0)
Hemoglobin: 9.3 g/dL — ABNORMAL LOW (ref 12.0–15.0)
Immature Granulocytes: 0 %
Lymphocytes Relative: 19 %
Lymphs Abs: 1.1 10*3/uL (ref 0.7–4.0)
MCH: 27 pg (ref 26.0–34.0)
MCHC: 33.1 g/dL (ref 30.0–36.0)
MCV: 81.7 fL (ref 80.0–100.0)
Monocytes Absolute: 0.4 10*3/uL (ref 0.1–1.0)
Monocytes Relative: 7 %
Neutro Abs: 3.9 10*3/uL (ref 1.7–7.7)
Neutrophils Relative %: 68 %
Platelets: 266 10*3/uL (ref 150–400)
RBC: 3.44 MIL/uL — ABNORMAL LOW (ref 3.87–5.11)
RDW: 14.3 % (ref 11.5–15.5)
WBC: 5.8 10*3/uL (ref 4.0–10.5)
nRBC: 0 % (ref 0.0–0.2)

## 2023-03-02 LAB — BPAM RBC
Blood Product Expiration Date: 202407142359
ISSUE DATE / TIME: 202406101505

## 2023-03-02 LAB — GLUCOSE, CAPILLARY: Glucose-Capillary: 142 mg/dL — ABNORMAL HIGH (ref 70–99)

## 2023-03-02 LAB — BASIC METABOLIC PANEL
Anion gap: 6 (ref 5–15)
BUN: 27 mg/dL — ABNORMAL HIGH (ref 6–20)
CO2: 19 mmol/L — ABNORMAL LOW (ref 22–32)
Calcium: 8.3 mg/dL — ABNORMAL LOW (ref 8.9–10.3)
Chloride: 114 mmol/L — ABNORMAL HIGH (ref 98–111)
Creatinine, Ser: 2.35 mg/dL — ABNORMAL HIGH (ref 0.44–1.00)
GFR, Estimated: 26 mL/min — ABNORMAL LOW (ref >60)
Glucose, Bld: 174 mg/dL — ABNORMAL HIGH (ref 70–99)
Potassium: 4.6 mmol/L (ref 3.5–5.1)
Sodium: 139 mmol/L (ref 135–145)

## 2023-03-02 LAB — URINE CULTURE

## 2023-03-04 LAB — CULTURE, BLOOD (ROUTINE X 2): Special Requests: ADEQUATE

## 2023-03-05 LAB — CULTURE, BLOOD (ROUTINE X 2)

## 2024-03-04 ENCOUNTER — Other Ambulatory Visit: Payer: Self-pay

## 2024-03-04 ENCOUNTER — Emergency Department (HOSPITAL_BASED_OUTPATIENT_CLINIC_OR_DEPARTMENT_OTHER)

## 2024-03-04 ENCOUNTER — Inpatient Hospital Stay (HOSPITAL_BASED_OUTPATIENT_CLINIC_OR_DEPARTMENT_OTHER)
Admission: EM | Admit: 2024-03-04 | Discharge: 2024-03-09 | DRG: 371 | Disposition: A | Discharge status: Home / Self Care | Attending: Internal Medicine | Admitting: Internal Medicine

## 2024-03-04 ENCOUNTER — Encounter (HOSPITAL_BASED_OUTPATIENT_CLINIC_OR_DEPARTMENT_OTHER): Payer: Self-pay

## 2024-03-04 DIAGNOSIS — E7800 Pure hypercholesterolemia, unspecified: Secondary | ICD-10-CM | POA: Diagnosis present

## 2024-03-04 DIAGNOSIS — R1084 Generalized abdominal pain: Principal | ICD-10-CM

## 2024-03-04 DIAGNOSIS — E8770 Fluid overload, unspecified: Secondary | ICD-10-CM | POA: Diagnosis present

## 2024-03-04 DIAGNOSIS — E118 Type 2 diabetes mellitus with unspecified complications: Secondary | ICD-10-CM

## 2024-03-04 DIAGNOSIS — Z794 Long term (current) use of insulin: Secondary | ICD-10-CM

## 2024-03-04 DIAGNOSIS — Z8249 Family history of ischemic heart disease and other diseases of the circulatory system: Secondary | ICD-10-CM

## 2024-03-04 DIAGNOSIS — Z83438 Family history of other disorder of lipoprotein metabolism and other lipidemia: Secondary | ICD-10-CM

## 2024-03-04 DIAGNOSIS — N2581 Secondary hyperparathyroidism of renal origin: Secondary | ICD-10-CM | POA: Diagnosis present

## 2024-03-04 DIAGNOSIS — E1165 Type 2 diabetes mellitus with hyperglycemia: Secondary | ICD-10-CM | POA: Diagnosis not present

## 2024-03-04 DIAGNOSIS — Z7985 Long-term (current) use of injectable non-insulin antidiabetic drugs: Secondary | ICD-10-CM

## 2024-03-04 DIAGNOSIS — N200 Calculus of kidney: Secondary | ICD-10-CM | POA: Diagnosis present

## 2024-03-04 DIAGNOSIS — Z888 Allergy status to other drugs, medicaments and biological substances status: Secondary | ICD-10-CM

## 2024-03-04 DIAGNOSIS — L97519 Non-pressure chronic ulcer of other part of right foot with unspecified severity: Secondary | ICD-10-CM | POA: Diagnosis present

## 2024-03-04 DIAGNOSIS — K652 Spontaneous bacterial peritonitis: Secondary | ICD-10-CM | POA: Diagnosis not present

## 2024-03-04 DIAGNOSIS — Z885 Allergy status to narcotic agent status: Secondary | ICD-10-CM

## 2024-03-04 DIAGNOSIS — E1122 Type 2 diabetes mellitus with diabetic chronic kidney disease: Secondary | ICD-10-CM | POA: Diagnosis present

## 2024-03-04 DIAGNOSIS — Z79899 Other long term (current) drug therapy: Secondary | ICD-10-CM

## 2024-03-04 DIAGNOSIS — Z833 Family history of diabetes mellitus: Secondary | ICD-10-CM

## 2024-03-04 DIAGNOSIS — E66813 Obesity, class 3: Secondary | ICD-10-CM | POA: Diagnosis present

## 2024-03-04 DIAGNOSIS — I10 Essential (primary) hypertension: Secondary | ICD-10-CM | POA: Diagnosis present

## 2024-03-04 DIAGNOSIS — E1140 Type 2 diabetes mellitus with diabetic neuropathy, unspecified: Secondary | ICD-10-CM | POA: Diagnosis present

## 2024-03-04 DIAGNOSIS — I120 Hypertensive chronic kidney disease with stage 5 chronic kidney disease or end stage renal disease: Secondary | ICD-10-CM | POA: Diagnosis present

## 2024-03-04 DIAGNOSIS — N186 End stage renal disease: Secondary | ICD-10-CM | POA: Diagnosis present

## 2024-03-04 DIAGNOSIS — R109 Unspecified abdominal pain: Secondary | ICD-10-CM | POA: Diagnosis present

## 2024-03-04 DIAGNOSIS — Z91013 Allergy to seafood: Secondary | ICD-10-CM

## 2024-03-04 DIAGNOSIS — R188 Other ascites: Secondary | ICD-10-CM | POA: Diagnosis present

## 2024-03-04 DIAGNOSIS — Z992 Dependence on renal dialysis: Secondary | ICD-10-CM

## 2024-03-04 DIAGNOSIS — F32A Depression, unspecified: Secondary | ICD-10-CM | POA: Diagnosis present

## 2024-03-04 DIAGNOSIS — Z6841 Body Mass Index (BMI) 40.0 and over, adult: Secondary | ICD-10-CM

## 2024-03-04 DIAGNOSIS — D631 Anemia in chronic kidney disease: Secondary | ICD-10-CM | POA: Diagnosis present

## 2024-03-04 LAB — COMPREHENSIVE METABOLIC PANEL WITH GFR
ALT: 11 U/L (ref 0–44)
AST: 21 U/L (ref 15–41)
Albumin: 3.6 g/dL (ref 3.5–5.0)
Alkaline Phosphatase: 160 U/L — ABNORMAL HIGH (ref 38–126)
Anion gap: 15 (ref 5–15)
BUN: 39 mg/dL — ABNORMAL HIGH (ref 6–20)
CO2: 21 mmol/L — ABNORMAL LOW (ref 22–32)
Calcium: 8.3 mg/dL — ABNORMAL LOW (ref 8.9–10.3)
Chloride: 99 mmol/L (ref 98–111)
Creatinine, Ser: 5.82 mg/dL — ABNORMAL HIGH (ref 0.44–1.00)
GFR, Estimated: 9 mL/min — ABNORMAL LOW (ref >60)
Glucose, Bld: 240 mg/dL — ABNORMAL HIGH (ref 70–99)
Potassium: 4.1 mmol/L (ref 3.5–5.1)
Sodium: 135 mmol/L (ref 135–145)
Total Bilirubin: 0.4 mg/dL (ref 0.0–1.2)
Total Protein: 6.9 g/dL (ref 6.5–8.1)

## 2024-03-04 LAB — CBC WITH DIFFERENTIAL/PLATELET
Abs Immature Granulocytes: 0.02 10*3/uL (ref 0.00–0.07)
Basophils Absolute: 0 10*3/uL (ref 0.0–0.1)
Basophils Relative: 0 %
Eosinophils Absolute: 0.1 10*3/uL (ref 0.0–0.5)
Eosinophils Relative: 1 %
HCT: 29.5 % — ABNORMAL LOW (ref 36.0–46.0)
Hemoglobin: 10 g/dL — ABNORMAL LOW (ref 12.0–15.0)
Immature Granulocytes: 0 %
Lymphocytes Relative: 9 %
Lymphs Abs: 0.7 10*3/uL (ref 0.7–4.0)
MCH: 29.5 pg (ref 26.0–34.0)
MCHC: 33.9 g/dL (ref 30.0–36.0)
MCV: 87 fL (ref 80.0–100.0)
Monocytes Absolute: 0.4 10*3/uL (ref 0.1–1.0)
Monocytes Relative: 6 %
Neutro Abs: 5.9 10*3/uL (ref 1.7–7.7)
Neutrophils Relative %: 84 %
Platelets: 250 10*3/uL (ref 150–400)
RBC: 3.39 MIL/uL — ABNORMAL LOW (ref 3.87–5.11)
RDW: 18.6 % — ABNORMAL HIGH (ref 11.5–15.5)
WBC: 7.1 10*3/uL (ref 4.0–10.5)
nRBC: 0 % (ref 0.0–0.2)

## 2024-03-04 LAB — HCG, SERUM, QUALITATIVE: Preg, Serum: NEGATIVE

## 2024-03-04 LAB — LIPASE, BLOOD: Lipase: 60 U/L — ABNORMAL HIGH (ref 11–51)

## 2024-03-04 MED ORDER — ONDANSETRON HCL 4 MG/2ML IJ SOLN
4.0000 mg | Freq: Once | INTRAMUSCULAR | Status: AC
  Filled 2024-03-04: qty 2

## 2024-03-04 MED ORDER — HYDROMORPHONE HCL 1 MG/ML IJ SOLN
0.5000 mg | Freq: Once | INTRAMUSCULAR | Status: AC
  Administered 2024-03-04: 0.5 mg via INTRAVENOUS
  Filled 2024-03-04: qty 1

## 2024-03-04 NOTE — ED Notes (Signed)
 Report called over to [facility] and given to Kiana

## 2024-03-04 NOTE — ED Triage Notes (Signed)
 Pt c/o nausea, diarrhea, diffuse abd pain & lower back pain onset this afternoon, worse w deep breath. Peritoneal dialysis pt, concern for peritonitis.

## 2024-03-04 NOTE — Progress Notes (Signed)
 Hospitalist Transfer Note:    Nursing staff, Please call [facility] Admits & Consults System-Wide number on Amion [phone]) as soon as patient's arrival, so appropriate admitting provider can evaluate the pt.  Transferring facility: [facility] Requesting provider: Arthor Captain, PA (EDP at [facility]) Reason for transfer: admission for further evaluation and management of acute diverticulitis in the setting of failed outpatient antibiotics.     43 year old female  who presented to [facility] ED complaining of 3 weeks of progressive lower abdominal discomfort.  Approximately 1 week ago he had presented to his PCP with complaints of lower abdominal discomfort, at which time PCP was able to arrange for outpatient CT abdomen/pelvis which was suggestive of acute diverticulitis.  The patient subsequently completed a course of Cipro/Flagyl, but is noted further ensuing progression of his lower abdominal discomfort, prompting him to present to [facility] this evening.  Vital signs in the ED were notable for the following: Afebrile, heart rates in the 60s to 80s; systolic blood pressures in the 110s to 130s mmHg.   Imaging in the ED today notable for CT abdomen/pelvis with contrast, which showed acute diverticulitis without evidence of obstruction, perforation, or abscess.  Medications administered prior to transfer included the following: Zosyn   Subsequently, I accepted this patient for transfer for inpatient admission to a med/tele bed at [facility] or [facility]  (first available) for further work-up and management of the above.        Newton Pigg, DO Hospitalist

## 2024-03-04 NOTE — ED Provider Notes (Signed)
 [place] EMERGENCY DEPARTMENT AT [facility] Provider Note   CSN: [id number] Arrival date & time: 03/04/24  2015     Patient presents with: No chief complaint on file.   [name] is a 43 y.o. female.  {Add pertinent medical, surgical, social history, OB history to HPI:1454} 43 year old female with a history of ESRD on peritoneal dialysis who presents emergency department with back and abdominal pain.  Patient reports that approximately 5 hours prior to arrival she started having back pain that started rating into her abdomen.  Says that it hurts to move anywhere.  She is worried that she may have peritonitis.  Does currently have 2 L of dialysate in her abdomen.  Also has been having some nausea and vomiting.  Did have a bowel movement today as well.       Prior to Admission medications   Medication Sig Start Date End Date Taking? Authorizing Provider  amLODipine  (NORVASC ) 5 MG tablet Take 1 tablet by mouth once daily 10/28/22   Bridgette Campus, MD  ferrous sulfate  325 (65 FE) MG tablet Take 1 tablet (325 mg total) by mouth 2 (two) times daily with a meal. 03/01/23   Lorita Rosa, MD  insulin  degludec (TRESIBA ) 100 UNIT/ML FlexTouch Pen Inject 15 Units into the skin daily.    [provider]  mupirocin  cream (BACTROBAN ) 2 % Apply topically daily. 03/02/23   Lorita Rosa, MD  ondansetron  (ZOFRAN ) 8 MG tablet Take 1 tablet (8 mg total) by mouth every 8 (eight) hours as needed for nausea or vomiting. 03/26/21   Molpus, John, MD    Allergies: Shellfish allergy, Magnesium-containing compounds, Tramadol, and Lisinopril     Review of Systems  Updated Vital Signs There were no vitals taken for this visit.  Physical Exam Vitals and nursing note reviewed.  Constitutional:      General: She is not in acute distress.    Appearance: She is well-developed.  HENT:     Head: Normocephalic and atraumatic.     Right Ear: External ear normal.     Left Ear: External  ear normal.     Nose: Nose normal.   Eyes:     Extraocular Movements: Extraocular movements intact.     Conjunctiva/sclera: Conjunctivae normal.     Pupils: Pupils are equal, round, and reactive to light.   Pulmonary:     Effort: Pulmonary effort is normal. No respiratory distress.  Abdominal:     General: Abdomen is flat. There is distension.     Palpations: Abdomen is soft. There is no mass.     Tenderness: There is abdominal tenderness (Diffuse). There is right CVA tenderness and left CVA tenderness. There is no guarding.   Musculoskeletal:     Cervical back: Normal range of motion and neck supple.   Skin:    General: Skin is warm and dry.   Neurological:     Mental Status: She is alert and oriented to person, place, and time. Mental status is at baseline.   Psychiatric:        Mood and Affect: Mood normal.     (all labs ordered are listed, but only abnormal results are displayed) Labs Reviewed - No data to display  EKG: None  Radiology: No results found.  {Document cardiac monitor, telemetry assessment procedure when appropriate:32947} Procedures   Medications Ordered in the ED - No data to display    {Click here for ABCD2, HEART and other calculators REFRESH Note before signing:1}  Medical Decision Making Amount and/or Complexity of Data Reviewed Labs: ordered. Radiology: ordered.  Risk Prescription drug management.   ***  {Document critical care time when appropriate  Document review of labs and clinical decision tools ie CHADS2VASC2, etc  Document your independent review of radiology images and any outside records  Document your discussion with family members, caretakers and with consultants  Document social determinants of health affecting pt's care  Document your decision making why or why not admission, treatments were needed:32947:::1}   Final diagnoses:  None    ED Discharge Orders     None

## 2024-03-04 NOTE — ED Provider Notes (Signed)
 10:58 PM Assumed care from Dr. Adan Holms, please see their note for full history, physical and decision making until this point. In brief this is a 43 y.o. year old female who presented to the ED tonight with Abdominal Pain and Nausea      Peritonitic on exam but not septic. Spoke with Dr. Yvonnie Heritage, requests admit to [facility]. They will see and arrange diagnostic fluid retrieval. Held on abx per nephro recommendations. Had nausea as well. Improved with zofran .   Discharge instructions, including strict return precautions for new or worsening symptoms, given. Patient and/or family verbalized understanding and agreement with the plan as described.   Labs, studies and imaging reviewed by myself and considered in medical decision making if ordered. Imaging interpreted by radiology.  Labs Reviewed  CBC WITH DIFFERENTIAL/PLATELET - Abnormal; Notable for the following components:      Result Value   RBC 3.39 (*)    Hemoglobin 10.0 (*)    HCT 29.5 (*)    RDW 18.6 (*)    All other components within normal limits  COMPREHENSIVE METABOLIC PANEL WITH GFR - Abnormal; Notable for the following components:   CO2 21 (*)    Glucose, Bld 240 (*)    BUN 39 (*)    Creatinine, Ser 5.82 (*)    Calcium  8.3 (*)    Alkaline Phosphatase 160 (*)    GFR, Estimated 9 (*)    All other components within normal limits  LIPASE, BLOOD - Abnormal; Notable for the following components:   Lipase 60 (*)    All other components within normal limits  HCG, SERUM, QUALITATIVE  URINALYSIS, ROUTINE W REFLEX MICROSCOPIC    CT Renal Stone Study  Final Result      No follow-ups on file.    Savaya Hakes, Reymundo Caulk, MD 03/05/24 [phone]

## 2024-03-05 DIAGNOSIS — Z794 Long term (current) use of insulin: Secondary | ICD-10-CM

## 2024-03-05 DIAGNOSIS — E8770 Fluid overload, unspecified: Secondary | ICD-10-CM | POA: Diagnosis present

## 2024-03-05 DIAGNOSIS — D631 Anemia in chronic kidney disease: Secondary | ICD-10-CM | POA: Diagnosis present

## 2024-03-05 DIAGNOSIS — R109 Unspecified abdominal pain: Secondary | ICD-10-CM | POA: Diagnosis present

## 2024-03-05 DIAGNOSIS — E7800 Pure hypercholesterolemia, unspecified: Secondary | ICD-10-CM | POA: Diagnosis present

## 2024-03-05 DIAGNOSIS — R1084 Generalized abdominal pain: Secondary | ICD-10-CM | POA: Diagnosis not present

## 2024-03-05 DIAGNOSIS — E1140 Type 2 diabetes mellitus with diabetic neuropathy, unspecified: Secondary | ICD-10-CM | POA: Diagnosis present

## 2024-03-05 DIAGNOSIS — Z833 Family history of diabetes mellitus: Secondary | ICD-10-CM | POA: Diagnosis not present

## 2024-03-05 DIAGNOSIS — Z7985 Long-term (current) use of injectable non-insulin antidiabetic drugs: Secondary | ICD-10-CM | POA: Diagnosis not present

## 2024-03-05 DIAGNOSIS — I10 Essential (primary) hypertension: Secondary | ICD-10-CM | POA: Diagnosis not present

## 2024-03-05 DIAGNOSIS — Z992 Dependence on renal dialysis: Secondary | ICD-10-CM | POA: Diagnosis not present

## 2024-03-05 DIAGNOSIS — I120 Hypertensive chronic kidney disease with stage 5 chronic kidney disease or end stage renal disease: Secondary | ICD-10-CM | POA: Diagnosis present

## 2024-03-05 DIAGNOSIS — N2581 Secondary hyperparathyroidism of renal origin: Secondary | ICD-10-CM | POA: Diagnosis present

## 2024-03-05 DIAGNOSIS — E1165 Type 2 diabetes mellitus with hyperglycemia: Secondary | ICD-10-CM | POA: Diagnosis not present

## 2024-03-05 DIAGNOSIS — N186 End stage renal disease: Secondary | ICD-10-CM | POA: Diagnosis present

## 2024-03-05 DIAGNOSIS — R188 Other ascites: Secondary | ICD-10-CM | POA: Diagnosis present

## 2024-03-05 DIAGNOSIS — Z8249 Family history of ischemic heart disease and other diseases of the circulatory system: Secondary | ICD-10-CM | POA: Diagnosis not present

## 2024-03-05 DIAGNOSIS — E118 Type 2 diabetes mellitus with unspecified complications: Secondary | ICD-10-CM

## 2024-03-05 DIAGNOSIS — E66813 Obesity, class 3: Secondary | ICD-10-CM | POA: Diagnosis present

## 2024-03-05 DIAGNOSIS — K652 Spontaneous bacterial peritonitis: Secondary | ICD-10-CM | POA: Diagnosis present

## 2024-03-05 DIAGNOSIS — E1122 Type 2 diabetes mellitus with diabetic chronic kidney disease: Secondary | ICD-10-CM | POA: Diagnosis present

## 2024-03-05 DIAGNOSIS — L97519 Non-pressure chronic ulcer of other part of right foot with unspecified severity: Secondary | ICD-10-CM | POA: Diagnosis present

## 2024-03-05 DIAGNOSIS — Z6841 Body Mass Index (BMI) 40.0 and over, adult: Secondary | ICD-10-CM | POA: Diagnosis not present

## 2024-03-05 DIAGNOSIS — F32A Depression, unspecified: Secondary | ICD-10-CM | POA: Diagnosis present

## 2024-03-05 DIAGNOSIS — Z79899 Other long term (current) drug therapy: Secondary | ICD-10-CM | POA: Diagnosis not present

## 2024-03-05 DIAGNOSIS — N200 Calculus of kidney: Secondary | ICD-10-CM | POA: Diagnosis present

## 2024-03-05 LAB — CBC
HCT: 28.2 % — ABNORMAL LOW (ref 36.0–46.0)
Hemoglobin: 9.4 g/dL — ABNORMAL LOW (ref 12.0–15.0)
MCH: 29.4 pg (ref 26.0–34.0)
MCHC: 33.3 g/dL (ref 30.0–36.0)
MCV: 88.1 fL (ref 80.0–100.0)
Platelets: 232 10*3/uL (ref 150–400)
RBC: 3.2 MIL/uL — ABNORMAL LOW (ref 3.87–5.11)
RDW: 18.3 % — ABNORMAL HIGH (ref 11.5–15.5)
WBC: 9.1 10*3/uL (ref 4.0–10.5)
nRBC: 0 % (ref 0.0–0.2)

## 2024-03-05 LAB — RENAL FUNCTION PANEL
Albumin: 2.8 g/dL — ABNORMAL LOW (ref 3.5–5.0)
Anion gap: 14 (ref 5–15)
BUN: 41 mg/dL — ABNORMAL HIGH (ref 6–20)
CO2: 19 mmol/L — ABNORMAL LOW (ref 22–32)
Calcium: 7.8 mg/dL — ABNORMAL LOW (ref 8.9–10.3)
Chloride: 102 mmol/L (ref 98–111)
Creatinine, Ser: 6.04 mg/dL — ABNORMAL HIGH (ref 0.44–1.00)
GFR, Estimated: 8 mL/min — ABNORMAL LOW (ref >60)
Glucose, Bld: 229 mg/dL — ABNORMAL HIGH (ref 70–99)
Phosphorus: 5.6 mg/dL — ABNORMAL HIGH (ref 2.5–4.6)
Potassium: 4.2 mmol/L (ref 3.5–5.1)
Sodium: 135 mmol/L (ref 135–145)

## 2024-03-05 LAB — GLUCOSE, CAPILLARY
Glucose-Capillary: 264 mg/dL — ABNORMAL HIGH (ref 70–99)
Glucose-Capillary: 105 mg/dL — ABNORMAL HIGH (ref 70–99)
Glucose-Capillary: 160 mg/dL — ABNORMAL HIGH (ref 70–99)
Glucose-Capillary: 153 mg/dL — ABNORMAL HIGH (ref 70–99)
Glucose-Capillary: 165 mg/dL — ABNORMAL HIGH (ref 70–99)

## 2024-03-05 LAB — BODY FLUID CELL COUNT WITH DIFFERENTIAL
Eos, Fluid: 1 %
Lymphs, Fluid: 6 %
Monocyte-Macrophage-Serous Fluid: 3 % — ABNORMAL LOW (ref 50–90)
Neutrophil Count, Fluid: 90 % — ABNORMAL HIGH (ref 0–25)
Total Nucleated Cell Count, Fluid: 7425 uL — ABNORMAL HIGH (ref 0–1000)

## 2024-03-05 LAB — LIPASE, BLOOD: Lipase: 39 U/L (ref 11–51)

## 2024-03-05 LAB — HEMOGLOBIN A1C
Hgb A1c MFr Bld: 7.3 % — ABNORMAL HIGH (ref 4.8–5.6)
Mean Plasma Glucose: 162.81 mg/dL

## 2024-03-05 LAB — HIV ANTIBODY (ROUTINE TESTING W REFLEX): HIV Screen 4th Generation wRfx: NONREACTIVE

## 2024-03-05 MED ORDER — GENTAMICIN SULFATE 0.1 % EX CREA
1.0000 | TOPICAL_CREAM | Freq: Every day | CUTANEOUS | Status: DC
  Administered 2024-03-06 – 2024-03-09 (×5): 1 via TOPICAL
  Filled 2024-03-05: qty 15

## 2024-03-05 MED ORDER — OXYCODONE HCL 5 MG PO TABS
5.0000 mg | ORAL_TABLET | Freq: Four times a day (QID) | ORAL | Status: DC | PRN
  Administered 2024-03-07: 5 mg via ORAL
  Filled 2024-03-05 (×3): qty 1

## 2024-03-05 MED ORDER — HYDROMORPHONE HCL 1 MG/ML IJ SOLN
0.5000 mg | INTRAMUSCULAR | Status: DC | PRN
  Administered 2024-03-05 – 2024-03-08 (×14): 0.5 mg via INTRAVENOUS
  Filled 2024-03-05 (×2): qty 0.5
  Filled 2024-03-05 (×5): qty 1
  Filled 2024-03-05: qty 0.5
  Filled 2024-03-05 (×7): qty 1

## 2024-03-05 MED ORDER — NEPRO/CARBSTEADY PO LIQD: 237.0000 mL | Freq: Three times a day (TID) | ORAL | Status: DC | PRN

## 2024-03-05 MED ORDER — SODIUM CHLORIDE 0.9 % IV SOLN
1.0000 g | INTRAVENOUS | Status: DC
  Administered 2024-03-05 – 2024-03-07 (×3): 1 g via INTRAVENOUS
  Filled 2024-03-05 (×4): qty 10

## 2024-03-05 MED ORDER — INSULIN ASPART 100 UNIT/ML IJ SOLN
0.0000 [IU] | Freq: Three times a day (TID) | INTRAMUSCULAR | Status: DC
  Administered 2024-03-05 – 2024-03-07 (×5): 1 [IU] via SUBCUTANEOUS
  Administered 2024-03-07: 2 [IU] via SUBCUTANEOUS

## 2024-03-05 MED ORDER — CAMPHOR-MENTHOL 0.5-0.5 % EX LOTN: 1.0000 | TOPICAL_LOTION | Freq: Three times a day (TID) | CUTANEOUS | Status: DC | PRN

## 2024-03-05 MED ORDER — DELFLEX-LC/2.5% DEXTROSE 394 MOSM/L IP SOLN: INTRAPERITONEAL | Status: DC

## 2024-03-05 MED ORDER — SORBITOL 70 % SOLN: 30.0000 mL | Status: DC | PRN

## 2024-03-05 MED ORDER — ORAL CARE MOUTH RINSE: 15.0000 mL | OROMUCOSAL | Status: DC | PRN

## 2024-03-05 MED ORDER — HYDROXYZINE HCL 25 MG PO TABS
25.0000 mg | ORAL_TABLET | Freq: Three times a day (TID) | ORAL | Status: DC | PRN
Start: 2024-03-05 — End: 2024-03-09

## 2024-03-05 MED ORDER — VANCOMYCIN HCL 10 G IV SOLR
2500.0000 mg | Freq: Once | INTRAVENOUS | Status: AC
  Administered 2024-03-05: 2500 mg via INTRAVENOUS
  Filled 2024-03-05: qty 2500

## 2024-03-05 MED ORDER — INSULIN ASPART 100 UNIT/ML IJ SOLN
0.0000 [IU] | Freq: Every day | INTRAMUSCULAR | Status: DC
  Administered 2024-03-05: 3 [IU] via SUBCUTANEOUS
  Administered 2024-03-06 – 2024-03-08 (×2): 2 [IU] via SUBCUTANEOUS

## 2024-03-05 MED ORDER — ACETAMINOPHEN 650 MG RE SUPP: 650.0000 mg | Freq: Four times a day (QID) | RECTAL | Status: DC | PRN

## 2024-03-05 MED ORDER — CALCIUM CARBONATE ANTACID 1250 MG/5ML PO SUSP: 500.0000 mg | Freq: Four times a day (QID) | ORAL | Status: DC | PRN

## 2024-03-05 MED ORDER — METRONIDAZOLE 500 MG/100ML IV SOLN
500.0000 mg | Freq: Two times a day (BID) | INTRAVENOUS | Status: DC
  Administered 2024-03-05 – 2024-03-07 (×4): 500 mg via INTRAVENOUS
  Filled 2024-03-05 (×4): qty 100

## 2024-03-05 MED ORDER — DELFLEX-LC/1.5% DEXTROSE 344 MOSM/L IP SOLN
INTRAPERITONEAL | Status: DC
  Administered 2024-03-06: 6000 mL via INTRAPERITONEAL

## 2024-03-05 MED ORDER — ONDANSETRON HCL 4 MG/2ML IJ SOLN
4.0000 mg | Freq: Four times a day (QID) | INTRAMUSCULAR | Status: DC | PRN
Start: 2024-03-05 — End: 2024-03-09
  Administered 2024-03-05 – 2024-03-08 (×5): 4 mg via INTRAVENOUS
  Filled 2024-03-05 (×5): qty 2

## 2024-03-05 MED ORDER — DELFLEX-LC/2.5% DEXTROSE 394 MOSM/L IP SOLN
INTRAPERITONEAL | Status: DC
  Administered 2024-03-06 (×2): 6000 mL via INTRAPERITONEAL

## 2024-03-05 MED ORDER — ONDANSETRON HCL 4 MG PO TABS
4.0000 mg | ORAL_TABLET | Freq: Four times a day (QID) | ORAL | Status: DC | PRN
Start: 2024-03-05 — End: 2024-03-09
  Administered 2024-03-06 – 2024-03-08 (×2): 4 mg via ORAL
  Filled 2024-03-05 (×2): qty 1

## 2024-03-05 MED ORDER — ACETAMINOPHEN 325 MG PO TABS
650.0000 mg | ORAL_TABLET | Freq: Four times a day (QID) | ORAL | Status: DC | PRN
Start: 2024-03-05 — End: 2024-03-09
  Filled 2024-03-05: qty 2

## 2024-03-05 MED ORDER — HEPARIN SODIUM (PORCINE) 5000 UNIT/ML IJ SOLN
5000.0000 [IU] | Freq: Three times a day (TID) | INTRAMUSCULAR | Status: DC
  Administered 2024-03-05 – 2024-03-09 (×12): 5000 [IU] via SUBCUTANEOUS
  Filled 2024-03-05 (×13): qty 1

## 2024-03-05 MED ORDER — DOCUSATE SODIUM 283 MG RE ENEM: 1.0000 | ENEMA | RECTAL | Status: DC | PRN

## 2024-03-05 NOTE — Progress Notes (Signed)
 PROGRESS NOTE  [name] IRJ:[id number] DOB: [birth year]   PCP: Artemio Bilberry, PA-C  Patient is from: Home.  Independently ambulates at baseline.  DOA: 03/04/2024 LOS: 0  Chief complaints Chief Complaint  Patient presents with   Abdominal Pain   Nausea     Brief Narrative / Interim history: 43 year old F with PMH of ESRD on PD, DM-2, ACD, HTN, depression and morbid obesity presenting with acute abdominal pain, nausea and vomiting, and admitted due to concern for spontaneous bacterial peritonitis.  No report of fever, chills or peritoneal fluid color change.    In ED, stable vitals.  No leukocytosis.  Lab consistent with ESRD.  Nephrology consulted and recommended holding of antibiotics until peritoneal fluid analysis.   Subjective: Seen and examined earlier this morning.  No major events overnight or this morning.  Continues to endorse severe abdominal pain, nausea, vomiting and diarrhea.  Last emesis and bowel movement about 11:30 PM last night.  Denies blood in emesis or stool.  Denies peritoneal fluid color change.  Denies chest pain or shortness of breath.  Objective: Vitals:   03/05/24 0042 03/05/24 0530 03/05/24 0700 03/05/24 1029  BP: 137/73 103/68 115/66   Pulse: 80 89 84 87  Resp: 18 18    Temp: 99 F (37.2 C) 99.4 F (37.4 C) 99.1 F (37.3 C)   TempSrc: Oral Oral Oral   SpO2: 98% 97% 98% 98%  Weight: 105.7 kg 105.7 kg    Height: 5' 3 (1.6 m)       Examination:  GENERAL: No apparent distress.  Nontoxic. HEENT: MMM.  Vision and hearing grossly intact.  NECK: Supple.  No apparent JVD.  RESP:  No IWOB.  Fair aeration bilaterally. CVS:  RRR. Heart sounds normal.  ABD/GI/GU: BS+. Abd soft.  Very tender to palpation.  Seems to have involuntary guarding.  Peritoneal cath in place.  No drainage around catheter. MSK/EXT:  Moves extremities. No apparent deformity. No edema.  SKIN: no apparent skin lesion or wound NEURO: AA.  Oriented appropriately.  No  apparent focal neuro deficit. PSYCH: Calm. Normal affect.   Consultants:  Nephrology  Procedures: None  Microbiology summarized: Peritoneal fluid culture pending  Assessment and plan: Acute abdominal pain and tenderness in patient with ESRD on peritoneal dialysis: She has significant tenderness and guarding which concerns me for SBP but no fever, leukocytosis or peritoneal fluid color change.  Lipase slightly elevated but no radiologic evidence of pancreatitis on CT renal stone study.  LFT within normal. -Follow-up peritoneal fluid analysis-cell count with differential, Gram stain and culture - Multimodal pain control with Tylenol , oxycodone  and Dilaudid  - Antiemetics  ESRD on peritoneal dialysis -Per nephrology   NIDDM-2 with hyperglycemia: A1c 7.3%.  On Ozempic at home Recent Labs  Lab 03/05/24 0121 03/05/24 0522  GLUCAP 264* 105*  - Continue SSI-very sensitive  Essential hypertension: Normotensive off home antihypertensive meds - Continue holding home amlodipine   Anemia of renal disease: Stable -Monitor  Morbid obesity Body mass index is 41.28 kg/m. - Already on Ozempic          DVT prophylaxis:  heparin injection 5,000 Units Start: 03/05/24 0600  Code Status: Full code Family Communication: Updated patient's daughter at bedside Level of care: Telemetry Medical Status is: Observation The patient will require care spanning > 2 midnights and should be moved to inpatient because: Severe abdominal pain, possible spontaneous bacterial peritonitis   Final disposition: Home   55 minutes with more than 50% spent in reviewing records,  counseling patient/family and coordinating care.   Sch Meds:  Scheduled Meds:  gentamicin cream  1 Application Topical Daily   heparin  5,000 Units Subcutaneous Q8H   insulin  aspart  0-5 Units Subcutaneous QHS   insulin  aspart  0-6 Units Subcutaneous TID WC   Continuous Infusions:  dialysis solution 2.5% low-MG/low-CA      PRN Meds:.acetaminophen  **OR** acetaminophen , calcium  carbonate (dosed in mg elemental calcium ), camphor-menthol **AND** hydrOXYzine, docusate sodium, feeding supplement (NEPRO CARB STEADY), HYDROmorphone  (DILAUDID ) injection, ondansetron  **OR** ondansetron  (ZOFRAN ) IV, mouth rinse, oxyCODONE , sorbitol  Antimicrobials: Anti-infectives (From admission, onward)    None        I have personally reviewed the following labs and images: CBC: Recent Labs  Lab 03/04/24 2053 03/05/24 0248  WBC 7.1 9.1  NEUTROABS 5.9  --   HGB 10.0* 9.4*  HCT 29.5* 28.2*  MCV 87.0 88.1  PLT 250 232   BMP &GFR Recent Labs  Lab 03/04/24 2053 03/05/24 0248  NA 135 135  K 4.1 4.2  CL 99 102  CO2 21* 19*  GLUCOSE 240* 229*  BUN 39* 41*  CREATININE 5.82* 6.04*  CALCIUM  8.3* 7.8*  PHOS  --  5.6*   Estimated Creatinine Clearance: 14.1 mL/min (A) (by C-G formula based on SCr of 6.04 mg/dL (H)). Liver & Pancreas: Recent Labs  Lab 03/04/24 2053 03/05/24 0248  AST 21  --   ALT 11  --   ALKPHOS 160*  --   BILITOT 0.4  --   PROT 6.9  --   ALBUMIN 3.6 2.8*   Recent Labs  Lab 03/04/24 2053  LIPASE 60*   No results for input(s): AMMONIA in the last 168 hours. Diabetic: Recent Labs    03/05/24 0248  HGBA1C 7.3*   Recent Labs  Lab 03/05/24 0121 03/05/24 0522  GLUCAP 264* 105*   Cardiac Enzymes: No results for input(s): CKTOTAL, CKMB, CKMBINDEX, TROPONINI in the last 168 hours. No results for input(s): PROBNP in the last 8760 hours. Coagulation Profile: No results for input(s): INR, PROTIME in the last 168 hours. Thyroid  Function Tests: No results for input(s): TSH, T4TOTAL, FREET4, T3FREE, THYROIDAB in the last 72 hours. Lipid Profile: No results for input(s): CHOL, HDL, LDLCALC, TRIG, CHOLHDL, LDLDIRECT in the last 72 hours. Anemia Panel: No results for input(s): VITAMINB12, FOLATE, FERRITIN, TIBC, IRON , RETICCTPCT in the last  72 hours. Urine analysis:    Component Value Date/Time   COLORURINE YELLOW 02/27/2023 2343   APPEARANCEUR CLEAR 02/27/2023 2343   LABSPEC 1.011 02/27/2023 2343   PHURINE 6.5 02/27/2023 2343   GLUCOSEU 250 (A) 02/27/2023 2343   HGBUR SMALL (A) 02/27/2023 2343   BILIRUBINUR NEGATIVE 02/27/2023 2343   KETONESUR NEGATIVE 02/27/2023 2343   PROTEINUR >300 (A) 02/27/2023 2343   UROBILINOGEN 0.2 11/16/2014 0839   NITRITE NEGATIVE 02/27/2023 2343   LEUKOCYTESUR NEGATIVE 02/27/2023 2343   Sepsis Labs: Invalid input(s): PROCALCITONIN, LACTICIDVEN  Microbiology: No results found for this or any previous visit (from the past 240 hours).  Radiology Studies: CT Renal Stone Study Result Date: 03/04/2024 CLINICAL DATA:  back/abdominal pain Pt c/o nausea, diarrhea, diffuse abd pain AND lower back pain onset this afternoon, worse w deep breath. Peritoneal dialysis pt, concern for peritonitis. EXAM: CT ABDOMEN AND PELVIS WITHOUT CONTRAST TECHNIQUE: Multidetector CT imaging of the abdomen and pelvis was performed following the standard protocol without IV contrast. RADIATION DOSE REDUCTION: This exam was performed according to the departmental dose-optimization program which includes automated exposure control, adjustment of the  mA and/or kV according to patient size and/or use of iterative reconstruction technique. COMPARISON:  CT abdomen pelvis 02/28/2023 FINDINGS: Lower chest: No acute abnormality. Hepatobiliary: No focal liver abnormality. No gallstones, gallbladder wall thickening, or pericholecystic fluid. No biliary dilatation. Pancreas: No focal lesion. Normal pancreatic contour. No surrounding inflammatory changes. No main pancreatic ductal dilatation. Spleen: Normal in size without focal abnormality. Adrenals/Urinary Tract: No adrenal nodule bilaterally. No nephrolithiasis and no hydronephrosis. No definite contour-deforming renal mass. No ureterolithiasis or hydroureter. The urinary bladder is  unremarkable. Stomach/Bowel: Stomach is within normal limits. No evidence of bowel wall thickening or dilatation. Appendix appears normal. Vascular/Lymphatic: No abdominal aorta or iliac aneurysm. Mild atherosclerotic plaque of the aorta and its branches. No abdominal, pelvic, or inguinal lymphadenopathy. Reproductive: Uterus and bilateral adnexa are unremarkable. Other: Peritoneal dialysis catheter with tip within the pelvis. Small-to-moderate volume simple free intraperitoneal free fluid. No intraperitoneal free gas. No organized fluid collection. Musculoskeletal: No abdominal wall hernia or abnormality. No suspicious lytic or blastic osseous lesions. No acute displaced fracture. IMPRESSION: 1. Small-to-moderate volume simple ascites. Peritoneal dialysis catheter. 2. No acute intra-intrapelvic abnormality with limited evaluation on this noncontrast study. 3.  Aortic Atherosclerosis (ICD10-I70.0). Electronically Signed   By: Morgane  Naveau M.D.   On: 03/04/2024 22:36      Avigayil Ton T. Jacobi Ryant [facility]  If 7PM-7AM, please contact night-coverage [email or web address] 03/05/2024, 12:45 PM

## 2024-03-05 NOTE — Progress Notes (Signed)
 The body fluid samples have been sent to the laboratory.

## 2024-03-05 NOTE — Progress Notes (Signed)
 Pharmacy Antibiotic Note  [name] is a 43 y.o. female admitted on 03/04/2024 with peritonitispossibly due to breach in sterile technique.  Pharmacy has been consulted for vancomycin and cefepime dosing; also on metronidazole. She has ESRD and is on PD at home. She is afebrile, WBC are normal, and peritoneal fluid culture is pending.    Plan: Vancomycin 2500 mg IV load Check vancomycin level in 3-4 days and re-dose when <20 mcg/ml Cefepime 1 g IV q24h Monitor PD schedule, clinical progress, cultures/sensitivities F/U LOT and de-escalate as able   Height: 5' 3 (160 cm) Weight: 105.7 kg (233 lb 0.4 oz) IBW/kg (Calculated) : 52.4  Temp (24hrs), Avg:98.9 F (37.2 C), Min:98.6 F (37 C), Max:99.4 F (37.4 C)  Recent Labs  Lab 03/04/24 2053 03/05/24 0248  WBC 7.1 9.1  CREATININE 5.82* 6.04*    Estimated Creatinine Clearance: 14.1 mL/min (A) (by C-G formula based on SCr of 6.04 mg/dL (H)).    Allergies  Allergen Reactions   Shellfish Allergy Itching   Magnesium-Containing Compounds Hives   Tramadol Other (See Comments)    Makes patient twitch really bad   Lisinopril  Nausea And Vomiting    Antimicrobials this admission: vancomycin 6/15 >>  cefepime 6/15 >>  metronidazole 6/15 >>  Dose adjustments this admission:   Microbiology results: 6/15 peritoneal fluid:   Thank you for involving pharmacy in this patient's care.  Caroline Cinnamon, PharmD, BCPS Clinical Pharmacist Clinical phone for 03/05/2024 is [phone] 03/05/2024 3:01 PM

## 2024-03-05 NOTE — H&P (Signed)
 History and Physical    Patient: [name] BMW:[id number] DOB: 10/20/1980 DOA: 03/04/2024 DOS: the patient was seen and examined on 03/05/2024 PCP: Artemio Bilberry, PA-C  Patient coming from: Home  Chief Complaint:  Chief Complaint  Patient presents with   Abdominal Pain   Nausea   HPI: [name] is a 43 y.o. female with medical history significant of end-stage renal disease on peritoneal dialysis at home, type 2 diabetes, depression, anemia of chronic disease, essential hypertension who presents to the ED at [facility] complaining of 1 day of generalized abdominal pain nausea with vomiting.  Denied any hematemesis or melena no bright red blood per rectum.  Patient also denies any fever.  Patient has no prior episode.  She has a CT renal stone done that shows small to moderate volume ascites in the presence of her dialysis catheter.  Patient suspected to have spontaneous bacterial peritonitis.  She is afebrile with no white count at the moment.  Nephrology doctor.  Consulted and nephrology will follow patient in the hospital for some failure of peritoneal fluids for evaluation.  Recommendation is to hold off on antibiotic at this point unless patient becomes febrile.  Review of Systems: As mentioned in the history of present illness. All other systems reviewed and are negative. 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 She has never used smokeless tobacco. She reports that she does not drink alcohol and does not use drugs.  Allergies  Allergen Reactions   Shellfish Allergy  Itching   Magnesium-Containing Compounds Hives   Tramadol Other (See Comments)    Makes patient twitch really bad   Lisinopril  Nausea And Vomiting    Family History  Problem Relation Age of Onset   Diabetes Mother    Hypertension Mother    Hyperlipidemia Mother    Cancer Father     Prior to Admission medications   Medication Sig Start Date End Date Taking? Authorizing Provider  amLODipine  (NORVASC ) 5 MG tablet Take 1 tablet by mouth once daily 10/28/22   Bridgette Campus, MD  ferrous sulfate  325 (65 FE) MG tablet Take 1 tablet (325 mg total) by mouth 2 (two) times daily with a meal. 03/01/23   Lorita Rosa, MD  insulin  degludec (TRESIBA ) 100 UNIT/ML FlexTouch Pen Inject 15 Units into the skin daily.    [provider]  mupirocin  cream (BACTROBAN ) 2 % Apply topically daily. 03/02/23   Lorita Rosa, MD  ondansetron  (ZOFRAN ) 8 MG tablet Take 1 tablet (8 mg total) by mouth every 8 (eight) hours as needed for nausea or vomiting. 03/26/21   Molpus, Autry Legions, MD    Physical Exam: Vitals:   03/04/24 2300 03/04/24 2315 03/04/24 2330 03/04/24 2345  BP:  115/68 113/67 121/73  Pulse: 81 82 83 82  Resp: 16 17 18 14   Temp:    98.6 F (37 C)  TempSrc:    Oral  SpO2: 97% 94% 91% 100%   Constitutional: Acutely ill  looking, NAD, calm, comfortable Eyes: PERRL, lids and conjunctivae normal ENMT: Mucous membranes are moist. Posterior pharynx clear of any exudate or lesions.Normal dentition.  Neck: normal, supple, no masses, no thyromegaly Respiratory: clear to auscultation bilaterally, no wheezing, no crackles. Normal respiratory effort. No accessory muscle use.  Cardiovascular: Regular rate and rhythm, no murmurs / rubs / gallops. No extremity edema. 2+ pedal pulses. No carotid bruits.  Abdomen: Distended, diffusely tender, no masses palpated. No hepatosplenomegaly. Bowel sounds positive.  Musculoskeletal: Good range of motion, no joint swelling or tenderness, Skin: no rashes, lesions,  ulcers. No induration Neurologic: CN 2-12 grossly intact. Sensation intact, DTR normal. Strength 5/5 in all 4.  Psychiatric: Normal judgment and insight. Alert and oriented x 3. Normal mood  Data Reviewed:  Temperature 98.6, blood pressure 163/70, oxygen sat 85% on room air, hemoglobin 10.0, CO2 21, BUN 39 creatinine 5.82 and calcium  8.3.  Glucose 240 CT renal stone shows small to moderate volume simple ascites with peritoneal dialysis catheter.  No acute pelvic abnormality  Assessment and Plan:  #1 abdominal pain: Suspected spontaneous bacterial peritonitis.  Patient will be admitted.  Nephrology consulted.  Patient to have fluid evaluated.  After that decision will be made regarding antibiotics.  #2 end-stage renal disease: On peritoneal dialysis.  Nephrology to resume care  #3 diabetes: Type II.  Initiate sliding scale insulin   #4 anemia of chronic disease: Continue to monitor H&H  #5 essential hypertension: Blood pressure controlled.  Continue to monitor resume home regimen.    Advance Care Planning:   Code Status: Full Code   Consults: Dr. Yvonnie Heritage, nephrology  Family Communication: No family at bedside  Severity of Illness: The appropriate patient status for this patient is INPATIENT. Inpatient status is judged to be reasonable and necessary in order to provide the required intensity of service to ensure the patient's safety. The patient's presenting symptoms, physical exam findings, and initial radiographic and laboratory data in the context of their chronic comorbidities is felt to place them at high risk for further clinical deterioration. Furthermore, it is not anticipated that the patient will be medically stable for discharge from the hospital within 2 midnights of admission.   * I certify that at the point of admission it is my clinical judgment that the patient will require inpatient hospital care spanning beyond 2 midnights from the point of admission due to high intensity  of service, high risk for further deterioration and high frequency of surveillance required.*  AuthorCarolin Chyle, MD 03/05/2024 12:34 AM  For on call review [email or web address].

## 2024-03-05 NOTE — Consult Note (Addendum)
 [facility] Renal Consultation Note    Indication for Consultation:  Management of ESRD/hemodialysis, anemia, hypertension/volume, and secondary hyperparathyroidism.  HPI: [name] is a 43 y.o. female with PMH including ESRD on PD, DM, and anemia who presented to the ED with abdominal pain. She reports pain started yesterday. Associated with nausea and vomiting. Reports she is also experiencing dizziness, has been going on for awhile and her outpatient nephrologist recently stopped one of her BP meds. Abdomen is very tender to palpation. Denies fever, chills, constipation, diarrhea. She reports she has been on PD since 04/2023 without issues, but recently her machine malfunctioned and she had to set up another bag in the middle of the night. She thinks she may have had a breach in sterile technique while doing so. Denies urinary symptoms including dysuria, hematuria, flank pain. Reports mild SOB and chest feels a bit tight. She does 4 exchanges overnight, uses 2.5% exchanges when she feels volume overloaded or when weight is up. She also does a last fill, she thinks it is 1.8L but not sure. Compliant with PD but did miss last night due to ED visit.  PD fluid sample collected this AM for cell count and culture, currently pending.   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  Physical Exam: Vitals:   03/04/24 2345 03/05/24 0042 03/05/24 0530 03/05/24 0700   BP: 121/73 137/73 103/68 115/66  Pulse: 82 80 89 84  Resp: 14 18 18    Temp: 98.6 F (37 C) 99 F (37.2 C) 99.4 F (37.4 C) 99.1 F (37.3 C)  TempSrc: Oral Oral Oral Oral  SpO2: 100% 98% 97% 98%  Weight:  105.7 kg 105.7 kg   Height:  5' 3 (1.6 m)       General: Alert female in NAD Head: Normocephalic, atraumatic, sclera non-icteric, mucus membranes are moist. Neck:JVD not elevated. Lungs: Clear bilaterally to auscultation without wheezes, rales, or rhonchi. Breathing is unlabored on RA Heart: RRR with normal S1, S2. No murmurs Abdomen: Soft, mildly distended. PD cath in R abdomen, no redness/drainage around exit sire. Diffusely tender to mild palpation. +BS Musculoskeletal:  Strength and tone appear normal for age. Lower extremities: Trace ankle edema bilaterally Neuro: Alert and oriented X 3. Moves all extremities spontaneously. Psych:  Responds to questions appropriately with a normal affect.  Allergies  Allergen Reactions   Shellfish Allergy Itching   Magnesium-Containing Compounds Hives   Tramadol Other (See Comments)    Makes patient twitch really bad   Lisinopril  Nausea And Vomiting   Prior to Admission medications   Medication Sig Start Date End Date Taking? Authorizing Provider  amLODipine  (NORVASC ) 5 MG tablet Take 1 tablet by mouth once daily 10/28/22   Bridgette Campus, MD  ferrous sulfate  325 (65 FE) MG tablet Take  1 tablet (325 mg total) by mouth 2 (two) times daily with a meal. 03/01/23   Lorita Rosa, MD  insulin  degludec (TRESIBA ) 100 UNIT/ML FlexTouch Pen Inject 15 Units into the skin daily.    [provider]  mupirocin  cream (BACTROBAN ) 2 % Apply topically daily. 03/02/23   Lorita Rosa, MD  ondansetron  (ZOFRAN ) 8 MG tablet Take 1 tablet (8 mg total) by mouth every 8 (eight) hours as needed for nausea or vomiting. 03/26/21   Molpus, John, MD   Current Facility-Administered Medications  Medication Dose Route Frequency Provider Last Rate Last Admin    acetaminophen  (TYLENOL ) tablet 650 mg  650 mg Oral Q6H PRN Davida Espy, MD       Or   acetaminophen  (TYLENOL ) suppository 650 mg  650 mg Rectal Q6H PRN Davida Espy, MD       calcium  carbonate (dosed in mg elemental calcium ) suspension 500 mg of elemental calcium   500 mg of elemental calcium  Oral Q6H PRN Davida Espy, MD       camphor-menthol (SARNA) lotion 1 Application  1 Application Topical Q8H PRN Davida Espy, MD       And   hydrOXYzine (ATARAX) tablet 25 mg  25 mg Oral Q8H PRN Davida Espy, MD       docusate sodium (ENEMEEZ) enema 283 mg  1 enema Rectal PRN Davida Espy, MD       feeding supplement (NEPRO CARB STEADY) liquid 237 mL  237 mL Oral TID PRN Davida Espy, MD       heparin injection 5,000 Units  5,000 Units Subcutaneous Q8H Sudie Ely L, MD   5,000 Units at 03/05/24 0518   HYDROmorphone  (DILAUDID ) injection 0.5 mg  0.5 mg Intravenous Q3H PRN Sudie Ely L, MD   0.5 mg at 03/05/24 0518   insulin  aspart (novoLOG ) injection 0-5 Units  0-5 Units Subcutaneous QHS Sudie Ely L, MD   3 Units at 03/05/24 0127   insulin  aspart (novoLOG ) injection 0-6 Units  0-6 Units Subcutaneous TID WC Garba, Mohammad L, MD       ondansetron  (ZOFRAN ) tablet 4 mg  4 mg Oral Q6H PRN Davida Espy, MD       Or   ondansetron  (ZOFRAN ) injection 4 mg  4 mg Intravenous Q6H PRN Davida Espy, MD   4 mg at 03/05/24 0518   Oral care mouth rinse  15 mL Mouth Rinse PRN Sudie Ely L, MD       sorbitol 70 % solution 30 mL  30 mL Oral PRN Davida Espy, MD       Labs: Basic Metabolic Panel: Recent Labs  Lab 03/04/24 2053 03/05/24 0248  NA 135 135  K 4.1 4.2  CL 99 102  CO2 21* 19*  GLUCOSE 240* 229*  BUN 39* 41*  CREATININE 5.82* 6.04*  CALCIUM  8.3* 7.8*  PHOS  --  5.6*   Liver Function Tests: Recent Labs  Lab 03/04/24 2053 03/05/24 0248  AST 21  --   ALT 11  --   ALKPHOS 160*  --   BILITOT 0.4  --   PROT 6.9  --   ALBUMIN 3.6  2.8*   Recent Labs  Lab 03/04/24 2053  LIPASE 60*   No results for input(s): AMMONIA in the last 168 hours. CBC: Recent Labs  Lab 03/04/24 2053 03/05/24 0248  WBC 7.1 9.1  NEUTROABS 5.9  --   HGB 10.0* 9.4*  HCT 29.5* 28.2*  MCV 87.0 88.1  PLT 250 232   Cardiac Enzymes: No results for input(s): CKTOTAL, CKMB, CKMBINDEX, TROPONINI in the last 168 hours. CBG: Recent Labs  Lab 03/05/24 0121 03/05/24 0522  GLUCAP 264* 105*   Iron  Studies: No results for input(s): IRON , TIBC, TRANSFERRIN, FERRITIN in the last 72 hours. Studies/Results: CT Renal Stone Study Result Date: 03/04/2024 CLINICAL DATA:  back/abdominal pain Pt c/o nausea, diarrhea, diffuse abd pain AND lower back pain onset this afternoon, worse w deep breath. Peritoneal dialysis pt, concern for peritonitis. EXAM: CT ABDOMEN AND PELVIS WITHOUT CONTRAST TECHNIQUE: Multidetector CT imaging of the abdomen and pelvis was performed following the standard protocol without IV contrast. RADIATION DOSE REDUCTION: This exam was performed according to the departmental dose-optimization program which includes automated exposure control, adjustment of the mA and/or kV according to patient size and/or use of iterative reconstruction technique. COMPARISON:  CT abdomen pelvis 02/28/2023 FINDINGS: Lower chest: No acute abnormality. Hepatobiliary: No focal liver abnormality. No gallstones, gallbladder wall thickening, or pericholecystic fluid. No biliary dilatation. Pancreas: No focal lesion. Normal pancreatic contour. No surrounding inflammatory changes. No main pancreatic ductal dilatation. Spleen: Normal in size without focal abnormality. Adrenals/Urinary Tract: No adrenal nodule bilaterally. No nephrolithiasis and no hydronephrosis. No definite contour-deforming renal mass. No ureterolithiasis or hydroureter. The urinary bladder is unremarkable. Stomach/Bowel: Stomach is within normal limits. No evidence of bowel wall  thickening or dilatation. Appendix appears normal. Vascular/Lymphatic: No abdominal aorta or iliac aneurysm. Mild atherosclerotic plaque of the aorta and its branches. No abdominal, pelvic, or inguinal lymphadenopathy. Reproductive: Uterus and bilateral adnexa are unremarkable. Other: Peritoneal dialysis catheter with tip within the pelvis. Small-to-moderate volume simple free intraperitoneal free fluid. No intraperitoneal free gas. No organized fluid collection. Musculoskeletal: No abdominal wall hernia or abnormality. No suspicious lytic or blastic osseous lesions. No acute displaced fracture. IMPRESSION: 1. Small-to-moderate volume simple ascites. Peritoneal dialysis catheter. 2. No acute intra-intrapelvic abnormality with limited evaluation on this noncontrast study. 3.  Aortic Atherosclerosis (ICD10-I70.0). Electronically Signed   By: Morgane  Naveau M.D.   On: 03/04/2024 22:36    OP Dialysis Orders:  CCPD, followed by [facility] 4 exchanges, 2L fill volume, uses 1.5 or 2.5% bags Last fill unsure of volume, maybe  Assessment/Plan:  Abdominal pain/nausea: Acute onset following a possible breach in sterile technique. Seems consistent with peritonitis. Cell count and culture collected earlier this AM, will follow results and start antibiotics as indicated.   ESRD:  On PD, continue CCPD nightly, 4 exchanges  Hypertension/volume: BP well controlled. Trace edema on exam and missed PD last night, will use all 2.5% dialysate today.   Anemia: Hgb 9.4, will need to obtain med records from [facility] when they open tomorrow.   Metabolic bone disease: Corrected calcium  controlled, phos close to goal. On calcium  carbonate but does not appear to be on a phos binder.   Nutrition:  Continue renal diet.  Ramona Burner, PA-C 03/05/2024, 9:14 AM  [facility] Pager: [phone]   Seen and examined independently.  Agree with note and exam as documented above by physician extender and as  noted here.  Patient is a 43 year old female with a history of ESRD on PD since 04/2023 who follows with Dr. Gillermo Lack at [facility] who presented to a local free-standing ER for evaluation of abdominal pain.  She was evaluated and sent to [facility].  PD fluid cell count and culture were obtained this AM.  She is still having discomfort.  Labs are not back  yet.  Usually does 4 cycles over 8 hours, 2 liter fill volumes.  Last bag fill of 1.5 liters.  Usually 2.5% dextrose .  Given her BP, if she were at home today she would do a mix of 1.5 and 2.5% dextrose .  Here with her daughter and grandchild.  She ran out of PD fluid during the middle of the night and had to add a bag - thinks this is when she may have had a contamination.   General adult female in bed in some discomfort 2/2 pain HEENT normocephalic atraumatic extraocular movements intact sclera anicteric Neck supple trachea midline Lungs clear to auscultation bilaterally normal work of breathing at rest  Heart S1S2 no rub Abdomen soft tender to light palpation; obese habitus  Extremities no pitting edema  Psych normal mood and affect Neuro - alert and oriented x 3 provides hx and follows commands  PD associated peritonitis  - abx per primary team  - have suggested vanc/cefepime as a possible regimen  - await culture data   ESRD  - PD tonight - mix 1.5 and 2.5% dextrose    HTN  - acceptable control   Anemia of CKD  - Will need to determine when last ESA rec'd.   Metabolic bone disease  - mild hyperphos; on calcium  carbonate as above   Thank you for the consult.  Please do not hesitate to contact me with any questions regarding our patient.   Nan Aver, MD 03/05/2024  2:21 PM

## 2024-03-06 DIAGNOSIS — E66813 Obesity, class 3: Secondary | ICD-10-CM

## 2024-03-06 DIAGNOSIS — N186 End stage renal disease: Secondary | ICD-10-CM | POA: Diagnosis not present

## 2024-03-06 DIAGNOSIS — K652 Spontaneous bacterial peritonitis: Secondary | ICD-10-CM

## 2024-03-06 DIAGNOSIS — R1084 Generalized abdominal pain: Secondary | ICD-10-CM | POA: Diagnosis not present

## 2024-03-06 DIAGNOSIS — E118 Type 2 diabetes mellitus with unspecified complications: Secondary | ICD-10-CM | POA: Diagnosis not present

## 2024-03-06 DIAGNOSIS — R109 Unspecified abdominal pain: Secondary | ICD-10-CM | POA: Diagnosis not present

## 2024-03-06 LAB — MAGNESIUM: Magnesium: 1.6 mg/dL — ABNORMAL LOW (ref 1.7–2.4)

## 2024-03-06 LAB — RENAL FUNCTION PANEL
Albumin: 2.2 g/dL — ABNORMAL LOW (ref 3.5–5.0)
Anion gap: 8 (ref 5–15)
BUN: 40 mg/dL — ABNORMAL HIGH (ref 6–20)
CO2: 22 mmol/L (ref 22–32)
Calcium: 7.6 mg/dL — ABNORMAL LOW (ref 8.9–10.3)
Chloride: 100 mmol/L (ref 98–111)
Creatinine, Ser: 6.08 mg/dL — ABNORMAL HIGH (ref 0.44–1.00)
GFR, Estimated: 8 mL/min — ABNORMAL LOW (ref >60)
Glucose, Bld: 273 mg/dL — ABNORMAL HIGH (ref 70–99)
Phosphorus: 5.2 mg/dL — ABNORMAL HIGH (ref 2.5–4.6)
Potassium: 3.9 mmol/L (ref 3.5–5.1)
Sodium: 130 mmol/L — ABNORMAL LOW (ref 135–145)

## 2024-03-06 LAB — CBC
HCT: 25.6 % — ABNORMAL LOW (ref 36.0–46.0)
Hemoglobin: 8.6 g/dL — ABNORMAL LOW (ref 12.0–15.0)
MCH: 29.9 pg (ref 26.0–34.0)
MCHC: 33.6 g/dL (ref 30.0–36.0)
MCV: 88.9 fL (ref 80.0–100.0)
Platelets: 199 10*3/uL (ref 150–400)
RBC: 2.88 MIL/uL — ABNORMAL LOW (ref 3.87–5.11)
RDW: 17.6 % — ABNORMAL HIGH (ref 11.5–15.5)
WBC: 5.4 10*3/uL (ref 4.0–10.5)
nRBC: 0 % (ref 0.0–0.2)

## 2024-03-06 LAB — BODY FLUID CELL COUNT WITH DIFFERENTIAL
Eos, Fluid: 1 %
Lymphs, Fluid: 3 %
Monocyte-Macrophage-Serous Fluid: 10 % — ABNORMAL LOW (ref 50–90)
Neutrophil Count, Fluid: 86 % — ABNORMAL HIGH (ref 0–25)
Total Nucleated Cell Count, Fluid: 2725 uL — ABNORMAL HIGH (ref 0–1000)

## 2024-03-06 LAB — GLUCOSE, CAPILLARY
Glucose-Capillary: 178 mg/dL — ABNORMAL HIGH (ref 70–99)
Glucose-Capillary: 164 mg/dL — ABNORMAL HIGH (ref 70–99)
Glucose-Capillary: 139 mg/dL — ABNORMAL HIGH (ref 70–99)
Glucose-Capillary: 218 mg/dL — ABNORMAL HIGH (ref 70–99)

## 2024-03-06 LAB — PATHOLOGIST SMEAR REVIEW

## 2024-03-06 MED ORDER — VANCOMYCIN VARIABLE DOSE PER UNSTABLE RENAL FUNCTION (PHARMACIST DOSING): Status: DC

## 2024-03-06 MED ORDER — MAGNESIUM SULFATE 2 GM/50ML IV SOLN
2.0000 g | Freq: Once | INTRAVENOUS | Status: AC
  Administered 2024-03-06: 2 g via INTRAVENOUS
  Filled 2024-03-06: qty 50

## 2024-03-06 NOTE — Progress Notes (Addendum)
 PD post treatment note  PD treatment completed. Patient tolerated treatment well. PD effluent is cloudy.  specimen collected.  PD exit site clean, dry and intact. Patient is awake, oriented and in no acute distress.  Report given to bedside nurse.   Post treatment VS:   Total UF removed:  120 ml  Post treatment weight: 107.1 kg    03/06/24 0981  Peritoneal Catheter Left lower abdomen  No placement date or time found.   Catheter Location: Left lower abdomen  Site Assessment Clean, Dry, Intact  Drainage Description None  Catheter status Deaccessed  Dressing Gauze/Drain sponge  Dressing Status Clean, Dry, Intact  Dressing Intervention New dressing/dressing changed  Completion  Treatment Status Complete  Initial Drain Volume 1403  Average Dwell Time-Hour(s) 1  Average Dwell Time-Min(s) 30  Average Drain Time 13  Total Therapy Volume 7998  Total Therapy Time-Hour(s) 7  Total Therapy Time-Min(s) 76  Weight after Drain 229 lb 8 oz (104.1 kg)  Effluent Appearance Cloudy  Cell Count on Daytime Exchange Sent  Fluid Balance - CCPD  Total UF (+ value on cycler, pt loss) 120 mL  Procedure Comments  Peritoneal Dialysis Comments tx completed, tolerated well, but c/o abdominal pain.  Hand-off documentation  Hand-off Given Given to shift RN/LPN  Report given to (Full Name) Reva Castleman, RN  Hand-off Received Received from Transfer Unit/facility  Report received from (Full Name) Querida Beretta, Rn

## 2024-03-06 NOTE — Progress Notes (Signed)
 [facility] Progress Note   Assessment/ Plan:   OP Dialysis Orders:  [facility]PD, followed by [facility] 4 exchanges, 2L fill volume, uses 1.5 or 2.5% bags Last fill unsure of volume, maybe   Assessment/Plan:  Abdominal pain/nausea: Acute onset following a possible breach in sterile technique. Seems consistent with peritonitis. Cell count and culture collected show multiple WBCs.  On vanc/ cefepime.  Follow  ESRD:  On PD, continue CCPD nightly, 4 exchanges  Hypertension/volume: BP well controlled. Trace edema on exam and missed PD last night, will use all 2.5% dialysate today.   Anemia: Hgb 9.4, will need to obtain med records from [facility] when they open tomorrow.   Metabolic bone disease: Corrected calcium  controlled, phos close to goal. On calcium  carbonate but does not appear to be on a phos binder.   Nutrition:  Continue renal diet.  Subjective:    Seen in room.  Had PD last night.  Still having some abd pain.  Gram stain showing multiple WBCs, no orgs.    Objective:   BP (!) 146/78 (BP Location: Left Arm)   Pulse 85   Temp 98.5 F (36.9 C) (Oral)   Resp 19   Ht 5' 3 (1.6 m)   Wt 106.3 kg   SpO2 98%   BMI 41.51 kg/m   Physical Exam: [id number] uncomfortable CVS: EOMI PERRL Resp: clear Abd: soft but has some TTP Ext: no LE edema ACCESS: PD cath  Labs: BMET Recent Labs  Lab 03/04/24 2053 03/05/24 0248 03/06/24 0328  NA 135 135 130*  K 4.1 4.2 3.9  CL 99 102 100  CO2 21* 19* 22  GLUCOSE 240* 229* 273*  BUN 39* 41* 40*  CREATININE 5.82* 6.04* 6.08*  CALCIUM  8.3* 7.8* 7.6*  PHOS  --  5.6* 5.2*   CBC Recent Labs  Lab 03/04/24 2053 03/05/24 0248 03/06/24 0328  WBC 7.1 9.1 5.4  NEUTROABS 5.9  --   --   HGB 10.0* 9.4* 8.6*  HCT 29.5* 28.2* 25.6*  MCV 87.0 88.1 88.9  PLT 250 232 199      Medications:     gentamicin cream  1 Application Topical Daily   heparin  5,000 Units Subcutaneous Q8H   insulin  aspart  0-5 Units Subcutaneous QHS    insulin  aspart  0-6 Units Subcutaneous TID WC     Leandra Pro MD 03/06/2024, 11:24 AM

## 2024-03-06 NOTE — Progress Notes (Signed)
 PROGRESS NOTE  [name] FAO:[id number] DOB: 08-17-1981   PCP: Artemio Bilberry, PA-C  Patient is from: Home.  Independently ambulates at baseline.  DOA: 03/04/2024 LOS: 1  Chief complaints Chief Complaint  Patient presents with   Abdominal Pain   Nausea     Brief Narrative / Interim history: 43 year old F with PMH of ESRD on PD, DM-2, ACD, HTN, depression and morbid obesity presenting with acute abdominal pain, nausea and vomiting, and admitted due to concern for spontaneous bacterial peritonitis.  No report of fever, chills or peritoneal fluid color change.    In ED, stable vitals.  No leukocytosis.  Basic lab consistent with ESRD.  Peritoneal fluid on 6/15 with 6682 ANC suggesting SBP.  Started on vancomycin, cefepime and Flagyl.  ANC improved to 2343 on 6/16.  Peritoneal fluid culture pending.  Subjective: Seen and examined earlier this morning.  No major events overnight or this morning.  Continues to endorse significant abdominal pain.  He also had 1 episode of emesis this morning.  Emesis was nonbloody.  No further diarrhea.  Objective: Vitals:   03/06/24 0127 03/06/24 0447 03/06/24 0522 03/06/24 0740  BP: (!) 140/68 (!) 143/68  (!) 146/78  Pulse: 86   85  Resp: 18 17  19   Temp: 98.7 F (37.1 C) 98.7 F (37.1 C)  98.5 F (36.9 C)  TempSrc: Oral Oral  Oral  SpO2: 96% 98%  98%  Weight:   106.3 kg   Height:        Examination:  GENERAL: No apparent distress.  Nontoxic. HEENT: MMM.  Vision and hearing grossly intact.  NECK: Supple.  No apparent JVD.  RESP:  No IWOB.  Fair aeration bilaterally. CVS:  RRR. Heart sounds normal.  ABD/GI/GU: BS+. Abd soft.  Very tender to palpation.  Seems to have involuntary guarding.  Peritoneal cath in place.  No drainage around catheter. MSK/EXT:  Moves extremities. No apparent deformity. No edema.  SKIN: no apparent skin lesion or wound NEURO: AA.  Oriented appropriately.  No apparent focal neuro deficit. PSYCH: Calm.  Normal affect.   Consultants:  Nephrology  Procedures: None  Microbiology summarized: Peritoneal fluid culture pending  Assessment and plan: Acute abdominal pain and tenderness in patient with ESRD on peritoneal dialysis:  has significant tenderness, guarding and significantly elevated ANC and peritoneal fluid suggesting SBP.  Lipase slightly elevated but no radiologic evidence of pancreatitis on CT renal stone study.  LFT within normal. -Continue vancomycin, cefepime and Flagyl pending peritoneal fluid culture -Multimodal pain control with Tylenol , oxycodone  and Dilaudid  -Antiemetics  ESRD on peritoneal dialysis -Per nephrology   NIDDM-2 with hyperglycemia: A1c 7.3%.  On Ozempic at home Recent Labs  Lab 03/05/24 1241 03/05/24 1718 03/05/24 2117 03/06/24 0624 03/06/24 1144  GLUCAP 160* 153* 165* 178* 164*  - Continue SSI-very sensitive  Essential hypertension: Normotensive off home antihypertensive meds - Continue holding home amlodipine   Anemia of renal disease: Slight drop in Hgb.  No overt bleeding. Recent Labs    03/04/24 2053 03/05/24 0248 03/06/24 0328  HGB 10.0* 9.4* 8.6*  - Continue monitoring  Emesis: Likely due to #1 - Antiemetics  Morbid obesity Body mass index is 41.51 kg/m. - Already on Ozempic          DVT prophylaxis:  heparin injection 5,000 Units Start: 03/05/24 0600  Code Status: Full code Family Communication: None at bedside. Level of care: Telemetry Medical Status is: Inpatient The patient will remain inpatient because: Severe abdominal pain, spontaneous bacterial  peritonitis   Final disposition: Home   55 minutes with more than 50% spent in reviewing records, counseling patient/family and coordinating care.   Sch Meds:  Scheduled Meds:  gentamicin cream  1 Application Topical Daily   heparin  5,000 Units Subcutaneous Q8H   insulin  aspart  0-5 Units Subcutaneous QHS   insulin  aspart  0-6 Units Subcutaneous TID WC    Continuous Infusions:  ceFEPime (MAXIPIME) IV 1 g (03/05/24 1618)   dialysis solution 1.5% low-MG/low-CA     dialysis solution 2.5% low-MG/low-CA     metronidazole 500 mg (03/06/24 0457)   PRN Meds:.acetaminophen  **OR** acetaminophen , calcium  carbonate (dosed in mg elemental calcium ), camphor-menthol **AND** hydrOXYzine, docusate sodium, feeding supplement (NEPRO CARB STEADY), HYDROmorphone  (DILAUDID ) injection, ondansetron  **OR** ondansetron  (ZOFRAN ) IV, mouth rinse, oxyCODONE , sorbitol  Antimicrobials: Anti-infectives (From admission, onward)    Start     Dose/Rate Route Frequency Ordered Stop   03/05/24 1600  metroNIDAZOLE (FLAGYL) IVPB 500 mg        500 mg 100 mL/hr over 60 Minutes Intravenous Every 12 hours 03/05/24 1454     03/05/24 1600  vancomycin (VANCOCIN) 2,500 mg in sodium chloride  0.9 % 500 mL IVPB        2,500 mg 262.5 mL/hr over 120 Minutes Intravenous  Once 03/05/24 1511 03/05/24 2005   03/05/24 1600  ceFEPIme (MAXIPIME) 1 g in sodium chloride  0.9 % 100 mL IVPB        1 g 200 mL/hr over 30 Minutes Intravenous Every 24 hours 03/05/24 1511          I have personally reviewed the following labs and images: CBC: Recent Labs  Lab 03/04/24 2053 03/05/24 0248 03/06/24 0328  WBC 7.1 9.1 5.4  NEUTROABS 5.9  --   --   HGB 10.0* 9.4* 8.6*  HCT 29.5* 28.2* 25.6*  MCV 87.0 88.1 88.9  PLT 250 232 199   BMP &GFR Recent Labs  Lab 03/04/24 2053 03/05/24 0248 03/06/24 0328  NA 135 135 130*  K 4.1 4.2 3.9  CL 99 102 100  CO2 21* 19* 22  GLUCOSE 240* 229* 273*  BUN 39* 41* 40*  CREATININE 5.82* 6.04* 6.08*  CALCIUM  8.3* 7.8* 7.6*  MG  --   --  1.6*  PHOS  --  5.6* 5.2*   Estimated Creatinine Clearance: 14.1 mL/min (A) (by C-G formula based on SCr of 6.08 mg/dL (H)). Liver & Pancreas: Recent Labs  Lab 03/04/24 2053 03/05/24 0248 03/06/24 0328  AST 21  --   --   ALT 11  --   --   ALKPHOS 160*  --   --   BILITOT 0.4  --   --   PROT 6.9  --   --    ALBUMIN 3.6 2.8* 2.2*   Recent Labs  Lab 03/04/24 2053 03/05/24 0248  LIPASE 60* 39   No results for input(s): AMMONIA in the last 168 hours. Diabetic: Recent Labs    03/05/24 0248  HGBA1C 7.3*   Recent Labs  Lab 03/05/24 1241 03/05/24 1718 03/05/24 2117 03/06/24 0624 03/06/24 1144  GLUCAP 160* 153* 165* 178* 164*   Cardiac Enzymes: No results for input(s): CKTOTAL, CKMB, CKMBINDEX, TROPONINI in the last 168 hours. No results for input(s): PROBNP in the last 8760 hours. Coagulation Profile: No results for input(s): INR, PROTIME in the last 168 hours. Thyroid  Function Tests: No results for input(s): TSH, T4TOTAL, FREET4, T3FREE, THYROIDAB in the last 72 hours. Lipid Profile: No results for input(s):  CHOL, HDL, LDLCALC, TRIG, CHOLHDL, LDLDIRECT in the last 72 hours. Anemia Panel: No results for input(s): VITAMINB12, FOLATE, FERRITIN, TIBC, IRON , RETICCTPCT in the last 72 hours. Urine analysis:    Component Value Date/Time   COLORURINE YELLOW 02/27/2023 2343   APPEARANCEUR CLEAR 02/27/2023 2343   LABSPEC 1.011 02/27/2023 2343   PHURINE 6.5 02/27/2023 2343   GLUCOSEU 250 (A) 02/27/2023 2343   HGBUR SMALL (A) 02/27/2023 2343   BILIRUBINUR NEGATIVE 02/27/2023 2343   KETONESUR NEGATIVE 02/27/2023 2343   PROTEINUR >300 (A) 02/27/2023 2343   UROBILINOGEN 0.2 11/16/2014 0839   NITRITE NEGATIVE 02/27/2023 2343   LEUKOCYTESUR NEGATIVE 02/27/2023 2343   Sepsis Labs: Invalid input(s): PROCALCITONIN, LACTICIDVEN  Microbiology: Recent Results (from the past 240 hours)  Body fluid culture w Gram Stain     Status: None (Preliminary result)   Collection Time: 03/05/24  5:45 AM   Specimen: Peritoneal Dialysate; Body Fluid  Result Value Ref Range Status   Specimen Description PERITONEAL DIALYSATE  Final   Special Requests NONE  Final   Gram Stain   Final    ABUNDANT WBC PRESENT, PREDOMINANTLY PMN NO ORGANISMS  SEEN Performed at [facility], [address] [place], [place] [address]    Culture PENDING  Incomplete   Report Status PENDING  Incomplete    Radiology Studies: No results found.     Kierra Jezewski T. Ramanda Paules [facility]  If 7PM-7AM, please contact night-coverage [email or web address] 03/06/2024, 12:03 PM

## 2024-03-06 NOTE — Progress Notes (Signed)
 PD post treatment note  PD treatment completed. Patient tolerated treatment well. PD effluent is cloudy.  specimen collected.  PD exit site clean, dry and intact. Patient is awake, oriented and in no acute distress.  Report given to bedside nurse.   Post treatment VS:   Total UF removed:  120 ml  Post treatment weight: 107.1 kg    03/06/24 0981  Peritoneal Catheter Left lower abdomen  No placement date or time found.   Catheter Location: Left lower abdomen  Site Assessment Clean, Dry, Intact  Drainage Description None  Catheter status Deaccessed  Dressing Gauze/Drain sponge  Dressing Status Clean, Dry, Intact  Dressing Intervention New dressing/dressing changed  Completion  Treatment Status Complete  Initial Drain Volume 1403  Average Dwell Time-Hour(s) 1  Average Dwell Time-Min(s) 30  Average Drain Time 13  Total Therapy Volume 7998  Total Therapy Time-Hour(s) 7  Total Therapy Time-Min(s) 76  Weight after Drain 229 lb 8 oz (104.1 kg)  Effluent Appearance Cloudy  Cell Count on Daytime Exchange Sent  Fluid Balance - CCPD  Total UF (+ value on cycler, pt loss) 120 mL  Procedure Comments  Peritoneal Dialysis Comments tx completed, tolerated well, but c/o abdominal pain.  Hand-off documentation  Hand-off Given Given to shift RN/LPN  Report given to (Full Name) Reva Castleman, RN  Hand-off Received Received from Transfer Unit/facility  Report received from (Full Name) Querida Beretta, Rn

## 2024-03-06 NOTE — TOC CM/SW Note (Signed)
 Transition of Care [facility]) - Inpatient Brief Assessment   Patient Details  Name: Vernisha Bacote MRN: [id number] Date of Birth: [birth year]  Transition of Care [facility]) CM/SW Contact:    Jennett Model, RN Phone Number: 03/06/2024, 12:35 PM   Clinical Narrative: From home with daughters and son, has PCP and insurance on file, states has no HH services in place at this time or DME at home.  States daughter, Charlotta Cook,  will transport them home at [employer] and family is support system, states gets medications from [facility] on [address].  Pta self ambulatory.  Patient gives this NCM permission to speak with her daughter.  She eats a low sodium diet, she has a scaler at home to weigh herself. She is a Peritoneal dialysis patient.    Transition of Care Asessment: Insurance and Status: Insurance coverage has been reviewed Patient has primary care physician: Yes Home environment has been reviewed: home with daughters and son Prior level of function:: indep Prior/Current Home Services: No current home services Social Drivers of Health Review: SDOH reviewed no interventions necessary Readmission risk has been reviewed: Yes Transition of care needs: no transition of care needs at this time

## 2024-03-07 DIAGNOSIS — R109 Unspecified abdominal pain: Secondary | ICD-10-CM | POA: Diagnosis not present

## 2024-03-07 DIAGNOSIS — R1084 Generalized abdominal pain: Secondary | ICD-10-CM | POA: Diagnosis not present

## 2024-03-07 DIAGNOSIS — Z992 Dependence on renal dialysis: Secondary | ICD-10-CM

## 2024-03-07 DIAGNOSIS — N186 End stage renal disease: Secondary | ICD-10-CM | POA: Diagnosis not present

## 2024-03-07 DIAGNOSIS — E118 Type 2 diabetes mellitus with unspecified complications: Secondary | ICD-10-CM | POA: Diagnosis not present

## 2024-03-07 DIAGNOSIS — L97519 Non-pressure chronic ulcer of other part of right foot with unspecified severity: Secondary | ICD-10-CM

## 2024-03-07 LAB — RENAL FUNCTION PANEL
Albumin: 2.2 g/dL — ABNORMAL LOW (ref 3.5–5.0)
Anion gap: 10 (ref 5–15)
BUN: 36 mg/dL — ABNORMAL HIGH (ref 6–20)
CO2: 24 mmol/L (ref 22–32)
Calcium: 7.9 mg/dL — ABNORMAL LOW (ref 8.9–10.3)
Chloride: 99 mmol/L (ref 98–111)
Creatinine, Ser: 5.75 mg/dL — ABNORMAL HIGH (ref 0.44–1.00)
GFR, Estimated: 9 mL/min — ABNORMAL LOW (ref >60)
Glucose, Bld: 226 mg/dL — ABNORMAL HIGH (ref 70–99)
Phosphorus: 5.7 mg/dL — ABNORMAL HIGH (ref 2.5–4.6)
Potassium: 3.5 mmol/L (ref 3.5–5.1)
Sodium: 133 mmol/L — ABNORMAL LOW (ref 135–145)

## 2024-03-07 LAB — GLUCOSE, CAPILLARY
Glucose-Capillary: 181 mg/dL — ABNORMAL HIGH (ref 70–99)
Glucose-Capillary: 220 mg/dL — ABNORMAL HIGH (ref 70–99)
Glucose-Capillary: 210 mg/dL — ABNORMAL HIGH (ref 70–99)
Glucose-Capillary: 167 mg/dL — ABNORMAL HIGH (ref 70–99)

## 2024-03-07 LAB — BODY FLUID CELL COUNT WITH DIFFERENTIAL
Eos, Fluid: 1 %
Lymphs, Fluid: 15 %
Monocyte-Macrophage-Serous Fluid: 23 % — ABNORMAL LOW (ref 50–90)
Neutrophil Count, Fluid: 60 % — ABNORMAL HIGH (ref 0–25)
Other Cells, Fluid: 1 %
Total Nucleated Cell Count, Fluid: 225 uL (ref 0–1000)

## 2024-03-07 LAB — CBC
HCT: 27.2 % — ABNORMAL LOW (ref 36.0–46.0)
Hemoglobin: 9.1 g/dL — ABNORMAL LOW (ref 12.0–15.0)
MCH: 29.4 pg (ref 26.0–34.0)
MCHC: 33.5 g/dL (ref 30.0–36.0)
MCV: 87.7 fL (ref 80.0–100.0)
Platelets: 225 10*3/uL (ref 150–400)
RBC: 3.1 MIL/uL — ABNORMAL LOW (ref 3.87–5.11)
RDW: 17.2 % — ABNORMAL HIGH (ref 11.5–15.5)
WBC: 4 10*3/uL (ref 4.0–10.5)
nRBC: 0 % (ref 0.0–0.2)

## 2024-03-07 LAB — PATHOLOGIST SMEAR REVIEW

## 2024-03-07 LAB — MAGNESIUM: Magnesium: 2.3 mg/dL (ref 1.7–2.4)

## 2024-03-07 MED ORDER — TORSEMIDE 20 MG PO TABS
40.0000 mg | ORAL_TABLET | Freq: Every day | ORAL | Status: DC
  Administered 2024-03-07 – 2024-03-09 (×3): 40 mg via ORAL
  Filled 2024-03-07 (×3): qty 2

## 2024-03-07 MED ORDER — INSULIN GLARGINE-YFGN 100 UNIT/ML ~~LOC~~ SOLN
5.0000 [IU] | Freq: Every day | SUBCUTANEOUS | Status: DC
  Administered 2024-03-07 – 2024-03-08 (×2): 5 [IU] via SUBCUTANEOUS
  Filled 2024-03-07 (×3): qty 0.05

## 2024-03-07 MED ORDER — POTASSIUM CHLORIDE CRYS ER 20 MEQ PO TBCR
20.0000 meq | EXTENDED_RELEASE_TABLET | Freq: Once | ORAL | Status: AC
  Administered 2024-03-07: 20 meq via ORAL
  Filled 2024-03-07: qty 1

## 2024-03-07 MED ORDER — CARVEDILOL 6.25 MG PO TABS
6.2500 mg | ORAL_TABLET | Freq: Two times a day (BID) | ORAL | Status: DC
  Administered 2024-03-07 – 2024-03-09 (×4): 6.25 mg via ORAL
  Filled 2024-03-07 (×4): qty 1

## 2024-03-07 MED ORDER — AMLODIPINE BESYLATE 5 MG PO TABS
5.0000 mg | ORAL_TABLET | Freq: Every day | ORAL | Status: DC
  Administered 2024-03-07 – 2024-03-09 (×3): 5 mg via ORAL
  Filled 2024-03-07 (×3): qty 1

## 2024-03-07 MED ORDER — INSULIN ASPART 100 UNIT/ML IJ SOLN
0.0000 [IU] | Freq: Three times a day (TID) | INTRAMUSCULAR | Status: DC
  Administered 2024-03-07: 3 [IU] via SUBCUTANEOUS
  Administered 2024-03-08 – 2024-03-09 (×3): 2 [IU] via SUBCUTANEOUS

## 2024-03-07 NOTE — Consult Note (Signed)
 WOC Nurse Consult Note: Reason for Consult: right foot wound Patient is followed by the [facility] wound care center weekly for neuropathic foot ulcer and offloading. 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 She has topical care for wound and offloading with felt and fracture boot Wound type: neuropathic foot ulcer x 3 months; last seen 02/29/24 per Dr. Adora Aland Pressure Injury POA: NA Measurement: see nursing flow sheets, post debridement measurements on 6/10-0.9cm x 0.4cm x 0.4cm  Wound bed:see nursing flow sheets Drainage (amount, consistency, odor) see nursing flow sheets Periwound: intact  Dressing procedure/placement/frequency: Cleanse right foot wound with saline, pat dry Cover wound with silver  hydrofiber Timm Foot # [phone]), top with foam.  Use fracture boot from home to offload,  OT consult for felt offloading if available.  NWB if not able to offload foot . Notified MD and PT   Discussed POC with patient and bedside nurse.  Re consult if needed, will not follow at this time. Thanks  Gillie Fleites [employer], RN,CWOCN, CNS, CWON-AP [phone])

## 2024-03-07 NOTE — Progress Notes (Signed)
 PROGRESS NOTE  [name] ZOX:[id number] DOB: August 06, 1981   PCP: Artemio Bilberry, PA-C  Patient is from: Home.  Independently ambulates at baseline.  DOA: 03/04/2024 LOS: 2  Chief complaints Chief Complaint  Patient presents with   Abdominal Pain   Nausea     Brief Narrative / Interim history: 43 year old F with PMH of ESRD on PD, DM-2, ACD, HTN, depression and morbid obesity presenting with acute abdominal pain, nausea and vomiting, and admitted due to concern for spontaneous bacterial peritonitis.  No report of fever, chills or peritoneal fluid color change.    In ED, stable vitals.  No leukocytosis.  Basic lab consistent with ESRD.  Peritoneal fluid neutrophilic count decreased from 8119-147.  Peritoneal fluid cultures NGTD.  Abdominal pain and tenderness improved.  Remains on broad-spectrum antibiotics with vancomycin and cefepime.  ID and nephrology on board.  Subjective: Seen and examined earlier this morning.  No major events overnight or this morning.  Reports improvement in her pain.  Denies nausea or vomiting.  Objective: Vitals:   03/06/24 2339 03/07/24 0433 03/07/24 0720 03/07/24 1053  BP: 138/73 (!) 163/81 (!) 145/81 (!) 136/90  Pulse:  85 85 88  Resp: 17 18 18 20   Temp: 98.1 F (36.7 C) 97.6 F (36.4 C) 97.7 F (36.5 C)   TempSrc: Oral Oral Oral   SpO2: 98% 98% 98% 99%  Weight:      Height:        Examination:  GENERAL: No apparent distress.  Nontoxic. HEENT: MMM.  Vision and hearing grossly intact.  NECK: Supple.  No apparent JVD.  RESP:  No IWOB.  Fair aeration bilaterally. CVS:  RRR. Heart sounds normal.  ABD/GI/GU: BS+. Abd soft.  Tender to palpation. MSK/EXT:  Moves extremities. No apparent deformity. No edema.  NEURO: AA.  Oriented appropriately.  No apparent focal neuro deficit. PSYCH: Calm. Normal affect.   Consultants:  Nephrology  Procedures: None  Microbiology summarized: Peritoneal fluid culture pending  Assessment and  plan: Spontaneous bacterial peritonitis in patient with ESRD on peritoneal dialysis: Has significant tenderness, guarding and significantly elevated ANC and peritoneal fluid suggesting SBP on admission.  Hematology and neutrophilic count improved significantly.  Abdominal pain and tenderness improved as well.  Peritoneal fluid cultures NGTD. -Appreciate input by ID-continue systemic vancomycin and cefepime while inpatient,  -ID to change to intraperitoneal antibiotics when ready for discharge to complete total of 14 days course -No indication for catheter removal per ID -Follow cultures. -Multimodal pain control with Tylenol , oxycodone  and Dilaudid  -Antiemetics  ESRD on peritoneal dialysis -Per nephrology   NIDDM-2 with hyperglycemia: A1c 7.3%.  On Ozempic at home Recent Labs  Lab 03/06/24 1144 03/06/24 1622 03/06/24 2020 03/07/24 0601 03/07/24 1051  GLUCAP 164* 139* 218* 181* 220*  - Increase SSI to sensitive - Add Semglee  5 units daily  Essential hypertension: Normotensive off home antihypertensive meds - Resume home amlodipine . - Resume home Coreg at reduced dose. - Resume home torsemide  Anemia of renal disease: Slight drop in Hgb.  No overt bleeding. Recent Labs    03/04/24 2053 03/05/24 0248 03/06/24 0328 03/07/24 0250  HGB 10.0* 9.4* 8.6* 9.1*  - Continue monitoring  Emesis: Likely due to #1 - Antiemetics  Morbid obesity Body mass index is 41.51 kg/m. - Already on Ozempic          DVT prophylaxis:  heparin injection 5,000 Units Start: 03/05/24 0600  Code Status: Full code Family Communication: None at bedside. Level of care: Telemetry Medical  Status is: Inpatient The patient will remain inpatient because: Severe abdominal pain, spontaneous bacterial peritonitis   Final disposition: Home   55 minutes with more than 50% spent in reviewing records, counseling patient/family and coordinating care.   Sch Meds:  Scheduled Meds:  gentamicin cream   1 Application Topical Daily   heparin  5,000 Units Subcutaneous Q8H   insulin  aspart  0-5 Units Subcutaneous QHS   insulin  aspart  0-9 Units Subcutaneous TID WC   vancomycin variable dose per unstable renal function (pharmacist dosing)   Does not apply See admin instructions   Continuous Infusions:  ceFEPime (MAXIPIME) IV Stopped (03/06/24 1609)   dialysis solution 1.5% low-MG/low-CA     dialysis solution 2.5% low-MG/low-CA     PRN Meds:.acetaminophen  **OR** acetaminophen , calcium  carbonate (dosed in mg elemental calcium ), camphor-menthol **AND** hydrOXYzine, docusate sodium, feeding supplement (NEPRO CARB STEADY), HYDROmorphone  (DILAUDID ) injection, ondansetron  **OR** ondansetron  (ZOFRAN ) IV, mouth rinse, oxyCODONE , sorbitol  Antimicrobials: Anti-infectives (From admission, onward)    Start     Dose/Rate Route Frequency Ordered Stop   03/06/24 1210  vancomycin variable dose per unstable renal function (pharmacist dosing)         Does not apply See admin instructions 03/06/24 1211     03/05/24 1600  metroNIDAZOLE (FLAGYL) IVPB 500 mg  Status:  Discontinued        500 mg 100 mL/hr over 60 Minutes Intravenous Every 12 hours 03/05/24 1454 03/07/24 1328   03/05/24 1600  vancomycin (VANCOCIN) 2,500 mg in sodium chloride  0.9 % 500 mL IVPB        2,500 mg 262.5 mL/hr over 120 Minutes Intravenous  Once 03/05/24 1511 03/05/24 2005   03/05/24 1600  ceFEPIme (MAXIPIME) 1 g in sodium chloride  0.9 % 100 mL IVPB        1 g 200 mL/hr over 30 Minutes Intravenous Every 24 hours 03/05/24 1511          I have personally reviewed the following labs and images: CBC: Recent Labs  Lab 03/04/24 2053 03/05/24 0248 03/06/24 0328 03/07/24 0250  WBC 7.1 9.1 5.4 4.0  NEUTROABS 5.9  --   --   --   HGB 10.0* 9.4* 8.6* 9.1*  HCT 29.5* 28.2* 25.6* 27.2*  MCV 87.0 88.1 88.9 87.7  PLT 250 232 199 225   BMP &GFR Recent Labs  Lab 03/04/24 2053 03/05/24 0248 03/06/24 0328 03/07/24 0250  NA 135 135  130* 133*  K 4.1 4.2 3.9 3.5  CL 99 102 100 99  CO2 21* 19* 22 24  GLUCOSE 240* 229* 273* 226*  BUN 39* 41* 40* 36*  CREATININE 5.82* 6.04* 6.08* 5.75*  CALCIUM  8.3* 7.8* 7.6* 7.9*  MG  --   --  1.6* 2.3  PHOS  --  5.6* 5.2* 5.7*   Estimated Creatinine Clearance: 14.9 mL/min (A) (by C-G formula based on SCr of 5.75 mg/dL (H)). Liver & Pancreas: Recent Labs  Lab 03/04/24 2053 03/05/24 0248 03/06/24 0328 03/07/24 0250  AST 21  --   --   --   ALT 11  --   --   --   ALKPHOS 160*  --   --   --   BILITOT 0.4  --   --   --   PROT 6.9  --   --   --   ALBUMIN 3.6 2.8* 2.2* 2.2*   Recent Labs  Lab 03/04/24 2053 03/05/24 0248  LIPASE 60* 39   No results for input(s): AMMONIA in  the last 168 hours. Diabetic: Recent Labs    03/05/24 0248  HGBA1C 7.3*   Recent Labs  Lab 03/06/24 1144 03/06/24 1622 03/06/24 2020 03/07/24 0601 03/07/24 1051  GLUCAP 164* 139* 218* 181* 220*   Cardiac Enzymes: No results for input(s): CKTOTAL, CKMB, CKMBINDEX, TROPONINI in the last 168 hours. No results for input(s): PROBNP in the last 8760 hours. Coagulation Profile: No results for input(s): INR, PROTIME in the last 168 hours. Thyroid  Function Tests: No results for input(s): TSH, T4TOTAL, FREET4, T3FREE, THYROIDAB in the last 72 hours. Lipid Profile: No results for input(s): CHOL, HDL, LDLCALC, TRIG, CHOLHDL, LDLDIRECT in the last 72 hours. Anemia Panel: No results for input(s): VITAMINB12, FOLATE, FERRITIN, TIBC, IRON , RETICCTPCT in the last 72 hours. Urine analysis:    Component Value Date/Time   COLORURINE YELLOW 02/27/2023 2343   APPEARANCEUR CLEAR 02/27/2023 2343   LABSPEC 1.011 02/27/2023 2343   PHURINE 6.5 02/27/2023 2343   GLUCOSEU 250 (A) 02/27/2023 2343   HGBUR SMALL (A) 02/27/2023 2343   BILIRUBINUR NEGATIVE 02/27/2023 2343   KETONESUR NEGATIVE 02/27/2023 2343   PROTEINUR >300 (A) 02/27/2023 2343   UROBILINOGEN 0.2  11/16/2014 0839   NITRITE NEGATIVE 02/27/2023 2343   LEUKOCYTESUR NEGATIVE 02/27/2023 2343   Sepsis Labs: Invalid input(s): PROCALCITONIN, LACTICIDVEN  Microbiology: Recent Results (from the past 240 hours)  Body fluid culture w Gram Stain     Status: None (Preliminary result)   Collection Time: 03/05/24  5:45 AM   Specimen: Peritoneal Dialysate; Body Fluid  Result Value Ref Range Status   Specimen Description PERITONEAL DIALYSATE  Final   Special Requests NONE  Final   Gram Stain   Final    ABUNDANT WBC PRESENT, PREDOMINANTLY PMN NO ORGANISMS SEEN    Culture   Final    NO GROWTH 2 DAYS Performed at [facility], [address], [place], [place] [address]    Report Status PENDING  Incomplete    Radiology Studies: No results found.     Rebeccah Ivins T. Kaien Pezzullo [facility]  If 7PM-7AM, please contact night-coverage [email or web address] 03/07/2024, 3:43 PM

## 2024-03-07 NOTE — Progress Notes (Signed)
 [facility] Progress Note   Assessment/ Plan:   OP Dialysis Orders:  [facility], followed by [facility] 4 exchanges, 2L fill volume, uses 1.5 or 2.5% bags Last fill unsure of volume, maybe   Assessment/Plan:  Abdominal pain/nausea: Acute onset following a possible breach in sterile technique. Seems consistent with peritonitis. Cell count and culture collected show multiple WBCs.  On vanc/ cefepime.  Cultures negative.  Will transition to IP antibiotics on d/c  ESRD:  On PD, continue CCPD nightly, 4 exchanges  Hypertension/volume: BP well controlled. Trace edema on exam and missed PD last night, will use all 2.5% dialysate today.   Anemia: Hgb 9.4, will need to obtain med records from [facility] when they open tomorrow.   Metabolic bone disease: Corrected calcium  controlled, phos close to goal. On calcium  carbonate but does not appear to be on a phos binder.   Nutrition:  Continue renal diet.  Subjective:    Cell ct coming down nicely.  Culture negative.  Blood cultures weren't drawn on admisison.  Appreciate ID.     Objective:   BP (!) 136/90 (BP Location: Left Arm)   Pulse 88   Temp 97.7 F (36.5 C) (Oral)   Resp 20   Ht 5' 3 (1.6 m)   Wt 106.3 kg   SpO2 99%   BMI 41.51 kg/m   Physical Exam: UEA:[id number] uncomfortable CVS: EOMI PERRL Resp: clear Abd: soft but has some TTP Ext: no LE edema ACCESS: PD cath  Labs: BMET Recent Labs  Lab 03/04/24 2053 03/05/24 0248 03/06/24 0328 03/07/24 0250  NA 135 135 130* 133*  K 4.1 4.2 3.9 3.5  CL 99 102 100 99  CO2 21* 19* 22 24  GLUCOSE 240* 229* 273* 226*  BUN 39* 41* 40* 36*  CREATININE 5.82* 6.04* 6.08* 5.75*  CALCIUM  8.3* 7.8* 7.6* 7.9*  PHOS  --  5.6* 5.2* 5.7*   CBC Recent Labs  Lab 03/04/24 2053 03/05/24 0248 03/06/24 0328 03/07/24 0250  WBC 7.1 9.1 5.4 4.0  NEUTROABS 5.9  --   --   --   HGB 10.0* 9.4* 8.6* 9.1*  HCT 29.5* 28.2* 25.6* 27.2*  MCV 87.0 88.1 88.9 87.7  PLT 250 232 199 225       Medications:     gentamicin cream  1 Application Topical Daily   heparin  5,000 Units Subcutaneous Q8H   insulin  aspart  0-5 Units Subcutaneous QHS   insulin  aspart  0-9 Units Subcutaneous TID WC   vancomycin variable dose per unstable renal function (pharmacist dosing)   Does not apply See admin instructions     Leandra Pro MD 03/07/2024, 1:42 PM

## 2024-03-07 NOTE — Consult Note (Addendum)
 I have seen and examined the patient. I have personally reviewed the clinical findings, laboratory findings, microbiological data and imaging studies. The assessment and treatment plan was discussed with the Nurse Practitioner. I agree with her/his recommendations except following additions/corrections.  Exam - adult female sitting in the bed, not in acute distress, HEENT wn, heart, lung wnl, abdomen soft, mild tenderness, non distended, rt foot wound bandaged C/D/I. No pedal edema. Awake, alert, non focal exam, no rashes  # PD Catheter associated peritonitis, culture negative  # Rt foot wound, no signs of infection and follows wound care OP   Continue IV Vancomycin  and IV cefepime  while IP. Can switch to intraperitoneal Vancomycin  and cefepime  starting tomorrow if cultures stay negative. Plan to treat for 21 days, culture negative  through 03/25/24. Defer antifungal prophylaxis.  Monitor CBC, BMP and Vancomycin  trough Universal/standard isolation precautions  ID available as needed, recall back with questions or concerns.  I spent 80 minutes involved in face-to-face and non-face-to-face activities for this patient on the day of the visit. Professional time spent includes the following activities: Preparing to see the patient (review of tests), Obtaining and reviewing separately obtained history (admission/discharge record), Performing a medically appropriate examination and evaluation , Ordering medications/tests, Documenting clinical information in the EMR, Independently interpreting results (not separately reported), communicating with other health care providers, Care coordination (not separately reported).    [facility]    Date of Admission:  03/04/2024      Total days of antibiotics 2   Vanc / cefepime                Reason for Consult: Peritonitis in PD patient     Referring Provider: Kathrin  Primary Care Provider: Boyd Jayson HERO, PA-C    Assessment: [name] is a 43 y.o. female admitted with    Bacterial Peritonitis -  Secondary PD ESRD with Possible Contamination -  This is the first episode of SBP - she is responding nicely to empiric vancomycin  / cefepime  with cell count now 225 from >7,000. Clinical exam is improved as well with only mild pain after 2 days of systemic antibiotics.  Given rapid improvement I think she is at a point where discharge can be considered and conversion to intraperitoneal abx to continue for 14 days total.  Given timeline of quick improvement would suspect gram positive organism / skin flora - probably only need to continue vancomycin   No blood cultures collected as she was not septic appearing  Culture negative from 6/15 peritoneal fluid prior to abx initiation.  No indication for removal of catheter.   - would continue IV vanc/cefepime  while inpatient and once all parties feel she is ready for D/C home will convert to intraperitoneal abx.  - FU cultures through to maturity   Right Foot Ulceration -  Not draining. In care with wound team. Insensate neuropathy but checks and monitors feet frequently  FU with outpatient wound team.  May benefit from inpatient WOC consult if her stay will be longer to provide recs for dressings.   ESRD -  On CCPD with nephrology following  D/W Dr. Gearline as well at the bedside.    Plan: Continue systemic vanc/cefepime  while inpatient  Will help with recs to convert to intraperitoneal abx when ready for D/C   No indications for catheter removal  Follow culture from 6/15 sample to maturity     Principal Problem:   Spontaneous bacterial peritonitis (HCC) Active Problems:  Type 2 diabetes mellitus with complication, with long-term current use of insulin  (HCC)   Essential hypertension   Obesity, Class III, BMI 40-49.9 (morbid obesity)   Abdominal pain   ESRD (end stage renal disease) (HCC)   Acute abdominal pain    gentamicin  cream  1  Application Topical Daily   heparin   5,000 Units Subcutaneous Q8H   insulin  aspart  0-5 Units Subcutaneous QHS   insulin  aspart  0-6 Units Subcutaneous TID WC   vancomycin  variable dose per unstable renal function (pharmacist dosing)   Does not apply See admin instructions    HPI: [name] is a 43 y.o. female admitted from home on 6/15 with abdominal pain.   PMHx:  ESRD on PD at home, DM, anemia.   Presented to the ER with abdominal pain that started 6/14 with associated nausea, vomiting and dizziness. Has been on PD since 04/2023 and recently had to troubleshoot her home machine and change bag in middle of the night leading to imperfect sterile technique.   Cell count of peritoneal fluid with 7,425 wbc, 90% neuts Repeat 24h later 2,725 wbc 86% neuts Culture from peritoneal fluid is negative preliminarily  Another cell count was collected today.   This is the first episode of associated bacterial peritonitis.   She also has a right foot wound that started in March - she sees wound care for this.   She felt very weak and nauseated, some SOB/coughing on Sunday, which is improved.  Pain in the abd is improved - mild today. No further nausea.    Review of Systems: Review of Systems  Constitutional:  Negative for chills and fever.  Respiratory: Negative.    Cardiovascular: Negative.   Gastrointestinal:  Positive for abdominal pain (improved, mild now). Negative for diarrhea, nausea and vomiting.  Musculoskeletal: Negative.     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   TUBAL LIGATION      Social History   Tobacco Use   Smoking status: Never    Smokeless tobacco: Never  Vaping Use   Vaping status: Never Used  Substance Use Topics   Alcohol use: No   Drug use: No    Family History  Problem Relation Age of Onset   Diabetes Mother    Hypertension Mother    Hyperlipidemia Mother    Cancer Father    Allergies  Allergen Reactions   Shellfish Allergy Itching   Magnesium -Containing Compounds Hives   Tramadol Other (See Comments)    Makes patient twitch really bad   Lisinopril  Nausea And Vomiting    OBJECTIVE: Blood pressure (!) 145/81, pulse 85, temperature 97.7 F (36.5 C), temperature source Oral, resp. rate 18, height 5' 3 (1.6 m), weight 106.3 kg, SpO2 98%.  Physical Exam Vitals reviewed.  Constitutional:      Appearance: She is well-developed. She is not ill-appearing.   Cardiovascular:     Rate and Rhythm: Normal rate.  Abdominal:     General: There is distension (mild).     Palpations: Abdomen is soft.  Tenderness: There is abdominal tenderness (mild).   Neurological:     Mental Status: She is alert.     Lab Results Lab Results  Component Value Date   WBC 4.0 03/07/2024   HGB 9.1 (L) 03/07/2024   HCT 27.2 (L) 03/07/2024   MCV 87.7 03/07/2024   PLT 225 03/07/2024    Lab Results  Component Value Date   CREATININE 5.75 (H) 03/07/2024   BUN 36 (H) 03/07/2024   NA 133 (L) 03/07/2024   K 3.5 03/07/2024   CL 99 03/07/2024   CO2 24 03/07/2024    Lab Results  Component Value Date   ALT 11 03/04/2024   AST 21 03/04/2024   ALKPHOS 160 (H) 03/04/2024   BILITOT 0.4 03/04/2024     Microbiology: Recent Results (from the past 240 hours)  Body fluid culture w Gram Stain     Status: None (Preliminary result)   Collection Time: 03/05/24  5:45 AM   Specimen: Peritoneal Dialysate; Body Fluid  Result Value Ref Range Status   Specimen Description PERITONEAL DIALYSATE  Final   Special Requests NONE  Final   Gram Stain   Final    ABUNDANT WBC PRESENT, PREDOMINANTLY PMN NO ORGANISMS SEEN     Culture   Final    NO GROWTH 1 DAY Performed at [facility], [address] [place], [place] [address]    Report Status PENDING  Incomplete   Imaging CT Renal Stone Study Result Date: 03/04/2024 CLINICAL DATA:  back/abdominal pain Pt c/o nausea, diarrhea, diffuse abd pain AND lower back pain onset this afternoon, worse w deep breath. Peritoneal dialysis pt, concern for peritonitis. EXAM: CT ABDOMEN AND PELVIS WITHOUT CONTRAST TECHNIQUE: Multidetector CT imaging of the abdomen and pelvis was performed following the standard protocol without IV contrast. RADIATION DOSE REDUCTION: This exam was performed according to the departmental dose-optimization program which includes automated exposure control, adjustment of the mA and/or kV according to patient size and/or use of iterative reconstruction technique. COMPARISON:  CT abdomen pelvis 02/28/2023 FINDINGS: Lower chest: No acute abnormality. Hepatobiliary: No focal liver abnormality. No gallstones, gallbladder wall thickening, or pericholecystic fluid. No biliary dilatation. Pancreas: No focal lesion. Normal pancreatic contour. No surrounding inflammatory changes. No main pancreatic ductal dilatation. Spleen: Normal in size without focal abnormality. Adrenals/Urinary Tract: No adrenal nodule bilaterally. No nephrolithiasis and no hydronephrosis. No definite contour-deforming renal mass. No ureterolithiasis or hydroureter. The urinary bladder is unremarkable. Stomach/Bowel: Stomach is within normal limits. No evidence of bowel wall thickening or dilatation. Appendix appears normal. Vascular/Lymphatic: No abdominal aorta or iliac aneurysm. Mild atherosclerotic plaque of the aorta and its branches. No abdominal, pelvic, or inguinal lymphadenopathy. Reproductive: Uterus and bilateral adnexa are unremarkable. Other: Peritoneal dialysis catheter with tip within the pelvis. Small-to-moderate volume simple free intraperitoneal free fluid. No intraperitoneal  free gas. No organized fluid collection. Musculoskeletal: No abdominal wall hernia or abnormality. No suspicious lytic or blastic osseous lesions. No acute displaced fracture. IMPRESSION: 1. Small-to-moderate volume simple ascites. Peritoneal dialysis catheter. 2. No acute intra-intrapelvic abnormality with limited evaluation on this noncontrast study. 3.  Aortic Atherosclerosis (ICD10-I70.0). Electronically Signed   By: Morgane  Naveau M.D.   On: 03/04/2024 22:36    I spent 30 minutes involved in face-to-face and non-face-to-face activities for this patient on the day of the visit. Professional time spent includes the following activities: Preparing to see the patient (review of tests), Obtaining and reviewing separately obtained history (admission/discharge record), Performing a medically appropriate examination and  evaluation , Ordering medications/tests, Documenting clinical information in the EMR, Independently interpreting results (not separately reported), communicating with other health care providers, Care coordination (not separately reported).   Corean Fireman, MSN, NP-C [facility] Pager: [phone]  Total Encounter Time: 25 minutes  03/07/2024 8:55 AM

## 2024-03-07 NOTE — Progress Notes (Signed)
 Pharmacy: Antimicrobial Stewardship Note  75 YOF receiving treatment for peritonitis with improving cell counts and culture ngx2d    Color, Fluid  GRAY GRAY COLORL...   Total Nucleated Cell Count, Fluid  7,425 2,725 225   Fluid Type-FCT  PERI... Peri... Peri...   Lymphs, Fluid  6 3 15    Eos, Fluid  1 1 1    Appearance, Fluid  CLOUDY CLOUDY HAZY   Other Cells, Fluid    1 BASO...   Neutrophil Count, Fluid  90 86 60   Monocyte-Macrophage-Serous Fluid  3 10 23     Microbiology Results  Procedure Component Value Units Date/Time  Body fluid culture w Gram Stain [[id number]] Collected: 03/05/24 0545  Order Status: Completed Specimen: Body Fluid from Peritoneal Dialysate Updated: 03/07/24 0913   Specimen Description PERITONEAL DIALYSATE   Special Requests NONE   Gram Stain --   ABUNDANT WBC PRESENT, PREDOMINANTLY PMN NO ORGANISMS SEEN   Culture --   NO GROWTH 2 DAYS Performed at [facility], [address], [place], [place] [address]   Report Status PENDING   The patient has been on intravenous vancomycin and cefepime and is approaching stability to transition back home - will document plan to convert to IP antibiotics.   Per the 2022 ISPD peritonitis guidelines - the recommended dosing for CCPD is as follows: - Vancomycin concentrated per CCPD bag at 25 mg/L - Cefepime concentrated per CCPD bag at 125 mg/L  Renal made aware of the recommendations and will help coordinate antibiotics with the patient's PD outpatient when the patient is ready for discharge.   Thank you for allowing pharmacy to be a part of this patient's care.  Garland Junk, PharmD, BCPS, BCIDP Infectious Diseases Clinical Pharmacist 03/07/2024 2:34 PM   **Pharmacist phone directory can now be found on [email or web address] ([email or web address]).  Listed under [facility].

## 2024-03-08 DIAGNOSIS — K652 Spontaneous bacterial peritonitis: Secondary | ICD-10-CM | POA: Diagnosis not present

## 2024-03-08 LAB — RENAL FUNCTION PANEL
Albumin: 2.4 g/dL — ABNORMAL LOW (ref 3.5–5.0)
Anion gap: 12 (ref 5–15)
BUN: 30 mg/dL — ABNORMAL HIGH (ref 6–20)
CO2: 23 mmol/L (ref 22–32)
Calcium: 8.2 mg/dL — ABNORMAL LOW (ref 8.9–10.3)
Chloride: 103 mmol/L (ref 98–111)
Creatinine, Ser: 5.67 mg/dL — ABNORMAL HIGH (ref 0.44–1.00)
GFR, Estimated: 9 mL/min — ABNORMAL LOW (ref >60)
Glucose, Bld: 174 mg/dL — ABNORMAL HIGH (ref 70–99)
Phosphorus: 5.1 mg/dL — ABNORMAL HIGH (ref 2.5–4.6)
Potassium: 4 mmol/L (ref 3.5–5.1)
Sodium: 138 mmol/L (ref 135–145)

## 2024-03-08 LAB — CBC
HCT: 27.7 % — ABNORMAL LOW (ref 36.0–46.0)
Hemoglobin: 9.2 g/dL — ABNORMAL LOW (ref 12.0–15.0)
MCH: 29.2 pg (ref 26.0–34.0)
MCHC: 33.2 g/dL (ref 30.0–36.0)
MCV: 87.9 fL (ref 80.0–100.0)
Platelets: 235 10*3/uL (ref 150–400)
RBC: 3.15 MIL/uL — ABNORMAL LOW (ref 3.87–5.11)
RDW: 17.1 % — ABNORMAL HIGH (ref 11.5–15.5)
WBC: 3 10*3/uL — ABNORMAL LOW (ref 4.0–10.5)
nRBC: 0 % (ref 0.0–0.2)

## 2024-03-08 LAB — BODY FLUID CELL COUNT WITH DIFFERENTIAL
Eos, Fluid: 18 %
Lymphs, Fluid: 32 %
Monocyte-Macrophage-Serous Fluid: 17 % — ABNORMAL LOW (ref 50–90)
Neutrophil Count, Fluid: 25 % (ref 0–25)
Other Cells, Fluid: 8 %
Total Nucleated Cell Count, Fluid: 17 uL (ref 0–1000)

## 2024-03-08 LAB — BODY FLUID CULTURE W GRAM STAIN

## 2024-03-08 LAB — GLUCOSE, CAPILLARY
Glucose-Capillary: 166 mg/dL — ABNORMAL HIGH (ref 70–99)
Glucose-Capillary: 152 mg/dL — ABNORMAL HIGH (ref 70–99)
Glucose-Capillary: 115 mg/dL — ABNORMAL HIGH (ref 70–99)
Glucose-Capillary: 212 mg/dL — ABNORMAL HIGH (ref 70–99)

## 2024-03-08 LAB — MAGNESIUM: Magnesium: 1.9 mg/dL (ref 1.7–2.4)

## 2024-03-08 LAB — VANCOMYCIN, RANDOM: Vancomycin Rm: 18 ug/mL

## 2024-03-08 MED ORDER — VANCOMYCIN 100 MG/ML FOR DIALYSIS
Status: DC
  Filled 2024-03-08 (×3): qty 6000

## 2024-03-08 MED ORDER — VANCOMYCIN 100 MG/ML FOR DIALYSIS
Status: DC
  Filled 2024-03-08: qty 6000

## 2024-03-08 NOTE — Evaluation (Signed)
 Physical Therapy Brief Evaluation and Discharge Note Patient Details Name: [name] MRN: [id number] DOB: 1981/02/21 Today's Date: 03/08/2024   History of Present Illness  Pt is a 43 y/o F admitted on 03/04/24 after presenting with c/o acute abdominal pain, N&V. Pt admitted for concern for spontaneous bacterial peritonitis. Pt also found to have R foot wound. PMH: ESRD on PD, DM2, ACD, HTN, depression, morbid obesity  Clinical Impression  Pt seen for PT evaluation with pt agreeable with encouragement. Pt reports she lives with her kids, grandkids in a 2nd floor apartment with a flight of stairs to access. Pt reports she's independent without AD, 1 fall in the past 6 months. Educated pt on recommendation for NWB RLE but pt allowed to ambulate with RLE boot donned. Educated pt on use of knee scooter vs RW vs rollator with recommendation to trial knee scooter but pt declines, noting she has one at home that she used in the past but she fell a couple times. Pt strongly encouraged pt to limit weight bearing through RLE but pt reports she likely won't use any DME at home. Pt is able to complete bed mobility with mod I, don boot independently, and ambulate in hallway without AD without LOB. PT encouraged pt to wear shoe on L foot when ambulating with R boot on. At this time, pt appears to be at baseline level of mobility, does not require acute PT services. PT to sign off at this time.       PT Assessment Patient does not need any further PT services  Assistance Needed at Discharge  PRN    Equipment Recommendations None recommended by PT  Recommendations for Other Services       Precautions/Restrictions Precautions Precautions: Fall Recall of Precautions/Restrictions: Intact Restrictions Weight Bearing Restrictions Per Provider Order: Yes RLE Weight Bearing Per Provider Order:  (Per wound care order: use fx boot to offload if the patient is ambulating. Otherwise NWB on the right foot.)         Mobility  Bed Mobility Rolling: Modified independent (Device/Increase time) Supine/Sidelying to sit: Modified independent (Device/Increased time)      Transfers Overall transfer level: Independent Equipment used: None Transfers: Sit to/from Stand Sit to Stand: Independent                Ambulation/Gait Ambulation/Gait assistance: Modified independent (Device/Increase time) Gait Distance (Feet): 50 Feet Assistive device: None Gait Pattern/deviations: Step-through pattern Gait Speed: Pace WFL    Home Activity Instructions    Stairs            Modified Rankin (Stroke Patients Only)        Balance Overall balance assessment: Mild deficits observed, not formally tested   Sitting balance-Leahy Scale: Normal Sitting balance - Comments: dons R boot sitting EOB without LOB   Standing balance support: During functional activity, No upper extremity supported Standing balance-Leahy Scale: Good            Pertinent Vitals/Pain   Pain Assessment Pain Assessment: Faces Faces Pain Scale: Hurts little more Pain Location: L side & low back, chronic pain Pain Descriptors / Indicators: Discomfort Pain Intervention(s): Premedicated before session, Monitored during session     Home Living Family/patient expects to be discharged to:: Private residence Living Arrangements: Children (adult children, grandkids) Available Help at Discharge: Family Home Environment: Stairs to enter  Landscape architect of Steps: flight Home Equipment: Other (comment) (knee scooter)        Prior Function Level of Independence: Independent Comments:  does endorse 1 fall in the past 6 months    UE/LE Assessment   UE ROM/Strength/Tone/Coordination: WFL    LE ROM/Strength/Tone/Coordination: [facility]      Communication   Communication Communication: No apparent difficulties     Cognition Overall Cognitive Status: Appears within functional limits for tasks assessed/performed        General Comments      Exercises     Assessment/Plan    PT Problem List         PT Visit Diagnosis      No Skilled PT Patient at baseline level of functioning   Co-evaluation                AMPAC 6 Clicks Help needed turning from your back to your side while in a flat bed without using bedrails?: None Help needed moving from lying on your back to sitting on the side of a flat bed without using bedrails?: None Help needed moving to and from a bed to a chair (including a wheelchair)?: None Help needed standing up from a chair using your arms (e.g., wheelchair or bedside chair)?: None Help needed to walk in hospital room?: None Help needed climbing 3-5 steps with a railing? : None 6 Click Score: 24      End of Session Equipment Utilized During Treatment:  (R boot) Activity Tolerance: Patient tolerated treatment well Patient left: in bed;with call bell/phone within reach         Time: 1040-1050 PT Time Calculation (min) (ACUTE ONLY): 10 min  Charges:   PT Evaluation $PT Eval Low Complexity: 1 Low      Emaline Handsome, PT, DPT 03/08/24, 11:07 AM   Venetta Gill  03/08/2024, 11:04 AM

## 2024-03-08 NOTE — Plan of Care (Signed)

## 2024-03-08 NOTE — Progress Notes (Signed)
 Pharmacy Antibiotic Note  [name] is a 43 y.o. female admitted on 03/04/2024 with peritonitispossibly due to breach in sterile technique. Cultures thus far have not yielded an organism to target so remains on vancomycin + cefepime for culture negative peritonitis with plans for likely discharge 6/19 AM.  Will plan to convert antibiotics from IV to IP to start the patient on her outpatient. Confirmed with Dr. Christianne Cowper planning at mix of 1.5%/2.5% this evening and with the PD-RN that 6L bags will be utilized.  ISPD peritonitis guidelines recommend: Vancomycin concentrated per CCPD bag at 25 mg/L Cefepime concentrated per CCPD bag at 125 mg/L  Plan: - Add Vancomycin 150 mg (25 mg/L) and cefepime 750 mg (125 mg/L) to a 6L 1.5% and 2.5% PD fluid bag - Plan for daily administration with CCPD to complete intended 21d duration - EOT 7/5 - Noted plans for likely discharge tomorrow to resume home CCPD sessions with IP antibiotics  Height: 5' 3 (160 cm) Weight: 106.3 kg (234 lb 5.6 oz) IBW/kg (Calculated) : 52.4  Temp (24hrs), Avg:98.1 F (36.7 C), Min:97.9 F (36.6 C), Max:98.3 F (36.8 C)  Recent Labs  Lab 03/04/24 2053 03/05/24 0248 03/06/24 0328 03/07/24 0250 03/08/24 0906  WBC 7.1 9.1 5.4 4.0 3.0*  CREATININE 5.82* 6.04* 6.08* 5.75* 5.67*  VANCORANDOM  --   --   --   --  18    Estimated Creatinine Clearance: 15.1 mL/min (A) (by C-G formula based on SCr of 5.67 mg/dL (H)).    Allergies  Allergen Reactions   Shellfish Allergy Itching   Tramadol Other (See Comments)    Makes patient twitch really bad   Lisinopril  Nausea And Vomiting    Antimicrobials this admission: vancomycin 6/15 >>  cefepime 6/15 >>  metronidazole 6/15 >>  Microbiology results: 6/15 peritoneal fluid: ngtd  Thank you for allowing pharmacy to be a part of this patient's care.  Garland Junk, PharmD, BCPS, BCIDP Infectious Diseases Clinical Pharmacist 03/08/2024 12:43 PM   **Pharmacist phone  directory can now be found on [email or web address] ([email or web address]).  Listed under [facility].

## 2024-03-08 NOTE — Care Management Important Message (Signed)
 Important Message  Patient Details  Name: [name] MRN: [id number] Date of Birth: 04-28-1981   Important Message Given:  Yes - Medicare IM     [name] 03/08/2024, 1:33 PM

## 2024-03-08 NOTE — Evaluation (Signed)
 Occupational Therapy Evaluation Patient Details Name: [name] MRN: [id number] DOB: 30-Sep-1980 Today's Date: 03/08/2024   History of Present Illness   Pt is a 43 yr old female who presented from home 03/04/24 due to abdominal pain and nausea. Pt admitted due to spontaneous bacterial peritonitis and has a Rt foot wound.  PMH of ESRD on PD, DM-2, ACD, HTN, depression and morbid obesity     Clinical Impressions Pt reported at PLOF they have family who can assist PRN and was not using any DME. Pt required set up for UE/LE  ADLS at EOB and supervision to CGA with ambulation due to balance with RLE. PT was educated about 4WW vs RW with RLE wound and offloading but they were more interested in a 4WW at this time. She also was educated about shower seat vs transfer bench due to the fit in bathroom space and was not sure. Pt decline to attempt stairs at this time. Acute Occupational Therapy to follow but no follow up therapies recommended.      If plan is discharge home, recommend the following:   Help with stairs or ramp for entrance;Assistance with cooking/housework     Functional Status Assessment   Patient has had a recent decline in their functional status and demonstrates the ability to make significant improvements in function in a reasonable and predictable amount of time.     Equipment Recommendations    [phone], shower seat vs shower bench (pt unsure about fit in home))     Recommendations for Other Services         Precautions/Restrictions   Precautions Precautions: Fall Recall of Precautions/Restrictions: Intact Restrictions Weight Bearing Restrictions Per Provider Order: Yes RLE Weight Bearing Per Provider Order:  (Per order: use fx boot to offload if the patient is ambulating. Otherwise NWB on the right foot.)     Mobility Bed Mobility Overal bed mobility: Modified Independent                  Transfers Overall transfer level: Needs  assistance Equipment used: None Transfers: Sit to/from Stand Sit to Stand: Modified independent (Device/Increase time)                  Balance Overall balance assessment: Mild deficits observed, not formally tested                                         ADL either performed or assessed with clinical judgement   ADL Overall ADL's : Needs assistance/impaired Eating/Feeding: Independent;Sitting   Grooming: Wash/dry hands;Wash/dry face;Supervision/safety;Standing   Upper Body Bathing: Set up;Sitting   Lower Body Bathing: Set up;Sit to/from stand   Upper Body Dressing : Set up;Sitting   Lower Body Dressing: Set up;Sit to/from stand   Toilet Transfer: Supervision/safety   Toileting- Architect and Hygiene: Supervision/safety       Functional mobility during ADLs: Contact guard assist       Vision   Vision Assessment?: No apparent visual deficits     Perception Perception: Within Functional Limits       Praxis Praxis: WFL       Pertinent Vitals/Pain Pain Assessment Pain Assessment: 0-10 Pain Score: 7  Pain Location: abdominal Pain Descriptors / Indicators: Aching Pain Intervention(s): Limited activity within patient's tolerance, Monitored during session, Repositioned     Extremity/Trunk Assessment Upper Extremity Assessment Upper Extremity Assessment: Right hand dominant;RUE deficits/detail  RUE Deficits / Details: Pt had WFL AROM but reported they have difficulties with RUE as had an injury and can swell up at times. RUE Sensation: WNL RUE Coordination: WNL   Lower Extremity Assessment Lower Extremity Assessment: Defer to PT evaluation   Cervical / Trunk Assessment Cervical / Trunk Assessment: Normal   Communication Communication Communication: No apparent difficulties   Cognition Arousal: Alert Behavior During Therapy: WFL for tasks assessed/performed Cognition: No apparent impairments                                Following commands: Intact       Cueing  General Comments   Cueing Techniques: Verbal cues  spoke to pt about shoes for LLE to assist with balance when donning RLE boot   Exercises     Shoulder Instructions      Home Living Family/patient expects to be discharged to:: Private residence Living Arrangements: Children;Spouse/significant other Available Help at Discharge: Family Type of Home: Apartment Home Access: Stairs to enter Secretary/administrator of Steps: 15 Entrance Stairs-Rails: Left Home Layout: One level     Bathroom Shower/Tub: IT trainer: Standard Bathroom Accessibility: Yes   Home Equipment: None          Prior Functioning/Environment Prior Level of Function : Independent/Modified Independent             Mobility Comments: Pt reported slow due to      OT Problem List: Pain   OT Treatment/Interventions: Self-care/ADL training;Balance training;Patient/family education;DME and/or AE instruction      OT Goals(Current goals can be found in the care plan section)   Acute Rehab OT Goals Patient Stated Goal: to rest OT Goal Formulation: With patient Time For Goal Achievement: 03/22/24 Potential to Achieve Goals: Fair   OT Frequency:  Min 1X/week    Co-evaluation              AM-PAC OT 6 Clicks Daily Activity     Outcome Measure Help from another person eating meals?: None Help from another person taking care of personal grooming?: None Help from another person toileting, which includes using toliet, bedpan, or urinal?: None Help from another person bathing (including washing, rinsing, drying)?: None Help from another person to put on and taking off regular upper body clothing?: None Help from another person to put on and taking off regular lower body clothing?: None 6 Click Score: 24   End of Session Equipment Utilized During Treatment: Gait belt Nurse Communication: Mobility  status  Activity Tolerance: Patient tolerated treatment well Patient left: in bed;with call bell/phone within reach  OT Visit Diagnosis: Unsteadiness on feet (R26.81);Pain Pain - Right/Left: Right Pain - part of body:  (abdomen)                Time: 7846-9629 OT Time Calculation (min): 23 min Charges:  OT General Charges $OT Visit: 1 Visit OT Evaluation $OT Eval Low Complexity: 1 Low OT Treatments $Self Care/Home Management : 8-22 mins  Erving Heather OTR/L  Acute Rehab Services  [phone] office number   Stevphen Elders 03/08/2024, 8:05 AM

## 2024-03-08 NOTE — Progress Notes (Signed)
 [facility] Progress Note   Assessment/ Plan:   OP Dialysis Orders:  [facility], followed by [facility] 4 exchanges, 2L fill volume, uses 1.5 or 2.5% bags Last fill unsure of volume, maybe   Assessment/Plan:  Abdominal pain/nausea: Acute onset following a possible breach in sterile technique. Seems consistent with peritonitis. Cell count and culture collected show multiple WBCs.  On vanc/ cefepime.  Cultures negative.  Will transition to IP antibiotics on d/c-- greatly appreciate pharmacy and ID  ESRD:  On PD, continue CCPD nightly, 4 exchanges  Hypertension/volume: BP well controlled. Trace edema on exam and missed PD last night, will use all 2.5% dialysate today.   Anemia: Hgb 9.4, will need to obtain med records from [facility] when they open tomorrow.   Metabolic bone disease: Corrected calcium  controlled, phos close to goal. On calcium  carbonate but does not appear to be on a phos binder.   Nutrition:  Continue renal diet.  Subjective:    Getting PD script all set up for d/c.  One more night here it looks like then d/c   Objective:   BP (!) 151/90   Pulse 87   Temp 98.3 F (36.8 C)   Resp 18   Ht 5' 3 (1.6 m)   Wt 106.3 kg   SpO2 98%   BMI 41.51 kg/m   Physical Exam: [id number] uncomfortable CVS: EOMI PERRL Resp: clear Abd: soft but has some TTP Ext: no LE edema ACCESS: PD cath  Labs: BMET Recent Labs  Lab 03/04/24 2053 03/05/24 0248 03/06/24 0328 03/07/24 0250 03/08/24 0906  NA 135 135 130* 133* 138  K 4.1 4.2 3.9 3.5 4.0  CL 99 102 100 99 103  CO2 21* 19* 22 24 23   GLUCOSE 240* 229* 273* 226* 174*  BUN 39* 41* 40* 36* 30*  CREATININE 5.82* 6.04* 6.08* 5.75* 5.67*  CALCIUM  8.3* 7.8* 7.6* 7.9* 8.2*  PHOS  --  5.6* 5.2* 5.7* 5.1*   CBC Recent Labs  Lab 03/04/24 2053 03/05/24 0248 03/06/24 0328 03/07/24 0250 03/08/24 0906  WBC 7.1 9.1 5.4 4.0 3.0*  NEUTROABS 5.9  --   --   --   --   HGB 10.0* 9.4* 8.6* 9.1* 9.2*  HCT 29.5* 28.2*  25.6* 27.2* 27.7*  MCV 87.0 88.1 88.9 87.7 87.9  PLT 250 232 199 225 235      Medications:     amLODipine   5 mg Oral Daily   carvedilol  6.25 mg Oral BID WC   gentamicin cream  1 Application Topical Daily   heparin  5,000 Units Subcutaneous Q8H   insulin  aspart  0-5 Units Subcutaneous QHS   insulin  aspart  0-9 Units Subcutaneous TID WC   insulin  glargine-yfgn  5 Units Subcutaneous Daily   torsemide  40 mg Oral Daily   vancomycin variable dose per unstable renal function (pharmacist dosing)   Does not apply See admin instructions     Leandra Pro MD 03/08/2024, 12:32 PM

## 2024-03-08 NOTE — Progress Notes (Addendum)
 Pt in need of abx with PD at d/c. Contacted PD RN at [facility] [phone]) [place] to coordinate. Requested clinicals faxed to clinic for review. Will await response from PD RN after reviewing info and speaking with pt's nephrologist to confirm arrangements are complete for pt's care after d/c. Team aware awaiting response from PD RN. Will assist as needed.   Lauraine Polite Renal Navigator [phone]  Addendum at 3:22 pm: Received call from pt's PD RN that nephrologist has reviewed info. Pt will need to go to [facility] (pt's normal PD provider) tomorrow at 2:00 pm to meet with PD RN and be dosed for abx for tomorrow and going forward. Met with pt at bedside to discuss plan for pt to be d/c in the am and then need to go to clinic tomorrow at 2:00. Pt aware and agreeable to plans. Pt was also advised to please take purple bags to clinic tomorrow for appt per PD RN request. Pt was advised that she will have several appts at clinic each week for lab draws and will have 2 appts on Fridays. Pt agreeable to this as well and will discuss further with PD staff tomorrow. Progress note for today from attending, nephrologist, and pharmacist faxed to PD RN with end date noted in their notes. Will fax d/c summary to clinic tomorrow once available. HD appt for tomorrow placed on pt's AVS as well. Update provided to attending, nephrologist, pharmacist, and pt's RN CM.

## 2024-03-08 NOTE — Progress Notes (Signed)
 PROGRESS NOTE    [name]  WUJ:[id number] DOB: 14-Oct-1980 DOA: 03/04/2024 PCP: Artemio Bilberry, PA-C    Brief Narrative:   [name] is a 43 y.o. female with past medical history significant for ESRD on peritoneal dialysis, DM2, anemia of chronic medical/renal disease, HTN, depression, obesity who presented to [facility] ED on 03/04/2024 with nausea, vomiting and abdominal pain.  Patient denied fever.  Patient underwent CT renal stone study showing small to moderate volume ascites in the presence of her dialysis catheter.  Concern for spontaneous bacterial peritonitis.  Nephrology was consulted and patient was transferred to [facility] for further evaluation and management.  Assessment & Plan:   Acute spontaneous bacterial peritonitis Patient presenting to ED with nausea, vomiting, abdominal pain.  CT renal stone study notable for  moderate ascites.  Patient underwent sampling of her peritoneal fluid on 03/05/2024 with total nucleated cell count of 7425.  -- Infectious disease, nephrology following, appreciate assistance -- TNC 7425>>225 -- Peritoneal fluid culture with no growth x 3 days -- Continue vancomycin, pharmacy consulted for dosing/monitoring -- Cefepime 1 g IV q24h -- ID plans conversion to IP antibiotics for total course x 21 days (End date 03/25/24); awaiting outpatient antibiotics to be set up by nephrology  ESRD on peritoneal dialysis -- Nephrology following for continued peritoneal dialysis while inpatient   Type 2 diabetes mellitus Hemoglobin A1c 7.3%.  On Ozempic at baseline. --Semglee  5 units [place] daily -- Sensitive SSI for coverage  HTN -- Amlodipine  5 mg p.o. daily -- Carvedilol 6.25 mg p.o. twice daily -- Torsemide 40 mg p.o. daily  Anemia of chronic medical/renal disease -- Hemoglobin 10.0>>9.1>9.2; stable  Obesity, class III Body mass index is 41.51 kg/m.  On Ozempic outpatient   DVT prophylaxis: heparin injection 5,000  Units Start: 03/05/24 0600    Code Status: Full Code Family Communication: No family present at bedside this morning  Disposition Plan:  Level of care: Telemetry Medical Status is: Inpatient Remains inpatient appropriate because: Awaiting antibiotics to be set up by nephrology before stable for discharge home    Consultants:  Nephrology Infectious disease  Procedures:  None  Antimicrobials:  Vancomycin 6/15>> Cefepime 6/15>> Metronidazole 6/15 - 6/16   Subjective: Patient seen examined bedside, lying in bed.  Abdominal pain much improved.  Awaiting for nephrology to set up outpatient antibiotics that she will be taking with her peritoneal dialysis.  No other specific complaints, concerns or questions this morning.  Denies headache, no dizziness, no chest pain, no palpitations, no shortness of breath, no nausea/vomiting/diarrhea, no fever/chills/night sweats, no fatigue, no paresthesias.  No acute events overnight per nursing staff.  Objective: Vitals:   03/07/24 1638 03/07/24 1954 03/07/24 2104 03/08/24 0753  BP: (!) 136/90 (!) 163/81 (!) 145/73 (!) 151/90  Pulse:  80 79 87  Resp:  20 18 18   Temp:  97.9 F (36.6 C) 98.2 F (36.8 C) 98.3 F (36.8 C)  TempSrc:  Oral    SpO2:  98% 94% 98%  Weight:      Height:        Intake/Output Summary (Last 24 hours) at 03/08/2024 1055 Last data filed at 03/08/2024 0729 Gross per 24 hour  Intake --  Output 97 ml  Net -97 ml   Filed Weights   03/05/24 0042 03/05/24 0530 03/06/24 0522  Weight: 105.7 kg 105.7 kg 106.3 kg    Examination:  Physical Exam: GEN: NAD, alert and oriented x 3, obese HEENT: NCAT, PERRL, EOMI, sclera  clear, MMM PULM: CTAB w/o wheezes/crackles, normal respiratory effort, on room air CV: RRR w/o M/G/R GI: abd soft, nondistended, + mild TTP, + BS, PD catheter noted MSK: Moves all extremities independently NEURO: No focal neurological deficit PSYCH: normal mood/affect Integumentary: No concerning  rashes/lesions/wounds noted on exposed skin surfaces    Data Reviewed: I have personally reviewed following labs and imaging studies  CBC: Recent Labs  Lab 03/04/24 2053 03/05/24 0248 03/06/24 0328 03/07/24 0250 03/08/24 0906  WBC 7.1 9.1 5.4 4.0 3.0*  NEUTROABS 5.9  --   --   --   --   HGB 10.0* 9.4* 8.6* 9.1* 9.2*  HCT 29.5* 28.2* 25.6* 27.2* 27.7*  MCV 87.0 88.1 88.9 87.7 87.9  PLT 250 232 199 225 235   Basic Metabolic Panel: Recent Labs  Lab 03/04/24 2053 03/05/24 0248 03/06/24 0328 03/07/24 0250 03/08/24 0906  NA 135 135 130* 133* 138  K 4.1 4.2 3.9 3.5 4.0  CL 99 102 100 99 103  CO2 21* 19* 22 24 23   GLUCOSE 240* 229* 273* 226* 174*  BUN 39* 41* 40* 36* 30*  CREATININE 5.82* 6.04* 6.08* 5.75* 5.67*  CALCIUM  8.3* 7.8* 7.6* 7.9* 8.2*  MG  --   --  1.6* 2.3  --   PHOS  --  5.6* 5.2* 5.7* 5.1*   GFR: Estimated Creatinine Clearance: 15.1 mL/min (A) (by C-G formula based on SCr of 5.67 mg/dL (H)). Liver Function Tests: Recent Labs  Lab 03/04/24 2053 03/05/24 0248 03/06/24 0328 03/07/24 0250 03/08/24 0906  AST 21  --   --   --   --   ALT 11  --   --   --   --   ALKPHOS 160*  --   --   --   --   BILITOT 0.4  --   --   --   --   PROT 6.9  --   --   --   --   ALBUMIN 3.6 2.8* 2.2* 2.2* 2.4*   Recent Labs  Lab 03/04/24 2053 03/05/24 0248  LIPASE 60* 39   No results for input(s): AMMONIA in the last 168 hours. Coagulation Profile: No results for input(s): INR, PROTIME in the last 168 hours. Cardiac Enzymes: No results for input(s): CKTOTAL, CKMB, CKMBINDEX, TROPONINI in the last 168 hours. BNP (last 3 results) No results for input(s): PROBNP in the last 8760 hours. HbA1C: No results for input(s): HGBA1C in the last 72 hours. CBG: Recent Labs  Lab 03/07/24 0601 03/07/24 1051 03/07/24 1609 03/07/24 2115 03/08/24 0751  GLUCAP 181* 220* 210* 167* 166*   Lipid Profile: No results for input(s): CHOL, HDL, LDLCALC, TRIG,  CHOLHDL, LDLDIRECT in the last 72 hours. Thyroid  Function Tests: No results for input(s): TSH, T4TOTAL, FREET4, T3FREE, THYROIDAB in the last 72 hours. Anemia Panel: No results for input(s): VITAMINB12, FOLATE, FERRITIN, TIBC, IRON , RETICCTPCT in the last 72 hours. Sepsis Labs: No results for input(s): PROCALCITON, LATICACIDVEN in the last 168 hours.  Recent Results (from the past 240 hours)  Body fluid culture w Gram Stain     Status: None   Collection Time: 03/05/24  5:45 AM   Specimen: Peritoneal Dialysate; Body Fluid  Result Value Ref Range Status   Specimen Description PERITONEAL DIALYSATE  Final   Special Requests NONE  Final   Gram Stain   Final    ABUNDANT WBC PRESENT, PREDOMINANTLY PMN NO ORGANISMS SEEN    Culture   Final    NO  GROWTH 3 DAYS Performed at [facility], [address], [place], [place] [address]    Report Status 03/08/2024 FINAL  Final         Radiology Studies: No results found.      Scheduled Meds:  amLODipine   5 mg Oral Daily   carvedilol  6.25 mg Oral BID WC   gentamicin cream  1 Application Topical Daily   heparin  5,000 Units Subcutaneous Q8H   insulin  aspart  0-5 Units Subcutaneous QHS   insulin  aspart  0-9 Units Subcutaneous TID WC   insulin  glargine-yfgn  5 Units Subcutaneous Daily   torsemide  40 mg Oral Daily   vancomycin variable dose per unstable renal function (pharmacist dosing)   Does not apply See admin instructions   Continuous Infusions:  ceFEPime (MAXIPIME) IV 1 g (03/07/24 1642)   dialysis solution 1.5% low-MG/low-CA     dialysis solution 2.5% low-MG/low-CA       LOS: 3 days    Time spent: 48 minutes spent on 03/08/2024 caring for this patient face-to-face including chart review, ordering labs/tests, documenting, discussion with nursing staff, consultants, updating family and interview/physical exam    Rema Care [place], DO [facility] Available via Epic secure chat  7am-7pm After these hours, please refer to coverage provider listed on [email or web address] 03/08/2024, 10:55 AM

## 2024-03-09 DIAGNOSIS — K652 Spontaneous bacterial peritonitis: Secondary | ICD-10-CM | POA: Diagnosis not present

## 2024-03-09 LAB — RENAL FUNCTION PANEL
Albumin: 2.3 g/dL — ABNORMAL LOW (ref 3.5–5.0)
Anion gap: 8 (ref 5–15)
BUN: 32 mg/dL — ABNORMAL HIGH (ref 6–20)
CO2: 26 mmol/L (ref 22–32)
Calcium: 8.2 mg/dL — ABNORMAL LOW (ref 8.9–10.3)
Chloride: 102 mmol/L (ref 98–111)
Creatinine, Ser: 6.05 mg/dL — ABNORMAL HIGH (ref 0.44–1.00)
GFR, Estimated: 8 mL/min — ABNORMAL LOW (ref >60)
Glucose, Bld: 179 mg/dL — ABNORMAL HIGH (ref 70–99)
Phosphorus: 5.8 mg/dL — ABNORMAL HIGH (ref 2.5–4.6)
Potassium: 4 mmol/L (ref 3.5–5.1)
Sodium: 136 mmol/L (ref 135–145)

## 2024-03-09 LAB — GLUCOSE, CAPILLARY: Glucose-Capillary: 195 mg/dL — ABNORMAL HIGH (ref 70–99)

## 2024-03-09 LAB — MAGNESIUM: Magnesium: 1.8 mg/dL (ref 1.7–2.4)

## 2024-03-09 MED ORDER — VANCOMYCIN 100 MG/ML FOR DIALYSIS: Status: AC

## 2024-03-09 MED ORDER — CARVEDILOL 6.25 MG PO TABS: 6.2500 mg | ORAL_TABLET | Freq: Two times a day (BID) | ORAL | 0 refills | Status: AC

## 2024-03-09 NOTE — Progress Notes (Signed)
 D/C summary faxed to [facility] PD RN.   Lauraine Polite Renal Navigator [phone]

## 2024-03-09 NOTE — TOC Transition Note (Signed)
 Transition of Care [facility]) - Discharge Note   Patient Details  Name: [name] MRN: [id number] Date of Birth: June 06, 1981  Transition of Care [facility]) CM/SW Contact:  Tom-Johnson, Angelique Ken, RN Phone Number: 03/09/2024, 9:18 AM   Clinical Narrative:     Patient is scheduled for discharge today.  Readmission Risk Assessment done. Outpatient f/u, hospital f/u and discharge instructions on AVS. No TOC needs or recommendations noted. Daughter, Diane to transport at discharge.  No further TOC needs noted.      Final next level of care: Home/Self Care Barriers to Discharge: Barriers Resolved   Patient Goals and CMS Choice Patient states their goals for this hospitalization and ongoing recovery are:: To return home CMS Medicare.gov Compare Post Acute Care list provided to:: Patient Choice offered to / list presented to : NA      Discharge Placement                Patient to be transferred to facility by: Daughter Name of family member notified: Diane    Discharge Plan and Services Additional resources added to the After Visit Summary for                  DME Arranged: N/A DME Agency: NA       HH Arranged: NA HH Agency: NA        Social Drivers of Health (SDOH) Interventions SDOH Screenings   Food Insecurity: No Food Insecurity (03/05/2024)  Housing: Low Risk  (03/05/2024)  Transportation Needs: No Transportation Needs (03/05/2024)  Utilities: Not At Risk (03/05/2024)  Financial Resource Strain: Medium Risk (04/12/2023)   Received from [facility]  Physical Activity: Unknown (04/12/2023)   Received from [facility]  Social Connections: Socially Integrated (04/12/2023)   Received from [facility]  Stress: Patient Declined (04/12/2023)   Received from [facility]  Tobacco Use: Low Risk  (03/04/2024)  Recent Concern: Tobacco Use - Medium Risk (02/29/2024)   Received from [facility]     Readmission Risk Interventions    03/06/2024   12:31  PM  Readmission Risk Prevention Plan  Transportation Screening Complete  PCP or Specialist Appt within 5-7 Days Complete  Home Care Screening Complete  Medication Review (RN CM) Complete

## 2024-03-09 NOTE — Discharge Summary (Signed)
 Physician Discharge Summary  [name] ZOX:[id number] DOB: 1980/10/20 DOA: 03/04/2024  PCP: Artemio Bilberry, PA-C  Admit date: 03/04/2024 Discharge date: 03/09/2024  Admitted From: Home Disposition: Home  Recommendations for Outpatient Follow-up:  Follow up with PCP in 1-2 weeks Carvedilol reduced to 6.25 mg p.o. twice daily Continue intraperitoneal antibiotics with vancomycin and cefepime per infectious disease recommendations with HD daily with end date 03/25/2024  Home Health: No Equipment/Devices: None  Discharge Condition: Stable CODE STATUS: Full code Diet recommendation: Renal diet with 1.2 L fluid restriction  History of present illness:  [name] is a 43 y.o. female with past medical history significant for ESRD on peritoneal dialysis, DM2, anemia of chronic medical/renal disease, HTN, depression, obesity who presented to [facility] ED on 03/04/2024 with nausea, vomiting and abdominal pain.  Patient denied fever.  Patient underwent CT renal stone study showing small to moderate volume ascites in the presence of her dialysis catheter.  Concern for spontaneous bacterial peritonitis.  Nephrology was consulted and patient was transferred to [facility] for further evaluation and management.   Hospital course:  Acute spontaneous bacterial peritonitis Patient presenting to ED with nausea, vomiting, abdominal pain.  CT renal stone study notable for  moderate ascites.  Patient underwent sampling of her peritoneal fluid on 03/05/2024 with total nucleated cell count of 7425.  Infectious disease was consulted and followed during hospital course.  Patient was started on vancomycin and cefepime.  Repeat peritoneal fluid analysis showed decreased total nucleated cells to 225.  Infectious disease converted IV antibiotics to intraperitoneal antibiotics for total course of 21 days with end date 03/25/2024.   ESRD on peritoneal dialysis Nephrology was consulted and  followed for continued peritoneal dialysis while inpatient   Type 2 diabetes mellitus Hemoglobin A1c 7.3%.  On Ozempic at baseline.   HTN Amlodipine  5 mg p.o. daily, carvedilol reduced to ol 6.25 mg p.o. twice daily, continue torsemide 40 mg p.o. daily   Anemia of chronic medical/renal disease Hemoglobin 9.2; stable   Obesity, class III Body mass index is 41.51 kg/m.  On Ozempic outpatient  Discharge Diagnoses:  Principal Problem:   Spontaneous bacterial peritonitis (HCC) Active Problems:   Type 2 diabetes mellitus with complication, with long-term current use of insulin  (HCC)   Essential hypertension   Obesity, Class III, BMI 40-49.9 (morbid obesity)   Abdominal pain   ESRD (end stage renal disease) (HCC)   Acute abdominal pain    Discharge Instructions  Discharge Instructions     Diet - low sodium heart healthy   Complete by: As directed    Discharge wound care:   Complete by: As directed    Cleanse right foot with saline, pat dry.  Apply silver  hydrofiber, top with foam. Wrap from toes to knees with two layers of kerlix, use fx boot to offload if the patient is ambulating.  Otherwise NWB on the right foot. OT referral made for access to felt for offloading, if available cut two U-shaped pieces of felt and place around the wound before wrapping with kerlix and using boot.   Increase activity slowly   Complete by: As directed       Allergies as of 03/09/2024       Reactions   Shellfish Allergy Itching   Tramadol Other (See Comments)   Makes patient twitch really bad   Lisinopril  Nausea And Vomiting        Medication List     STOP taking these medications    Renvela  800 MG tablet Generic drug: sevelamer carbonate       TAKE these medications    acidophilus Caps capsule Take 1 capsule by mouth daily.   amLODipine  5 MG tablet Commonly known as: NORVASC  Take 1 tablet by mouth once daily   calcitRIOL 0.25 MCG capsule Commonly known as:  ROCALTROL Take 0.25 mcg by mouth daily.   carvedilol 6.25 MG tablet Commonly known as: COREG Take 1 tablet (6.25 mg total) by mouth 2 (two) times daily with a meal. What changed:  medication strength how much to take   cholecalciferol 25 MCG (1000 UNIT) tablet Commonly known as: VITAMIN D3 Take 2,000 Units by mouth daily.   ferrous sulfate  325 (65 FE) MG tablet Take 1 tablet (325 mg total) by mouth 2 (two) times daily with a meal.   Ozempic (0.25 or 0.5 MG/DOSE) 2 MG/3ML Sopn Generic drug: Semaglutide(0.25 or 0.5MG /DOS) Inject 0.25 mg into the skin once a week.   torsemide 20 MG tablet Commonly known as: DEMADEX Take 40 mg by mouth daily.   vancomycin 150 mg, ceFEPIme 750 mg in dialysis solution 2.5% low-MG/low-CA 394 MOSM/L 6,000 mL Daily with each PD session as directed by nephrology and infectious disease               Discharge Care Instructions  (From admission, onward)           Start     Ordered   03/09/24 0000  Discharge wound care:       Comments: Cleanse right foot with saline, pat dry.  Apply silver  hydrofiber, top with foam. Wrap from toes to knees with two layers of kerlix, use fx boot to offload if the patient is ambulating.  Otherwise NWB on the right foot. OT referral made for access to felt for offloading, if available cut two U-shaped pieces of felt and place around the wound before wrapping with kerlix and using boot.   03/09/24 0831            Follow-up Information     Artemio Bilberry, PA-C. Go on 03/17/2024.   Specialty: Internal Medicine Why: @8 :45am Contact information: [address] [place] [address] [phone]         [facility]. Go on 03/09/2024.   Why: Please arrive at 2:00 pm to meet with PD RN.  Please take purple bags to clinic when you go. Contact information: [address] [place], [place] [address] [phone]               Allergies   Allergen Reactions   Shellfish Allergy Itching   Tramadol Other (See Comments)    Makes patient twitch really bad   Lisinopril  Nausea And Vomiting    Consultations: Infectious disease Nephrology   Procedures/Studies: CT Renal Stone Study Result Date: 03/04/2024 CLINICAL DATA:  back/abdominal pain Pt c/o nausea, diarrhea, diffuse abd pain AND lower back pain onset this afternoon, worse w deep breath. Peritoneal dialysis pt, concern for peritonitis. EXAM: CT ABDOMEN AND PELVIS WITHOUT CONTRAST TECHNIQUE: Multidetector CT imaging of the abdomen and pelvis was performed following the standard protocol without IV contrast. RADIATION DOSE REDUCTION: This exam was performed according to the departmental dose-optimization program which includes automated exposure control, adjustment of the mA and/or kV according to patient size and/or use of iterative reconstruction technique. COMPARISON:  CT abdomen pelvis 02/28/2023 FINDINGS: Lower chest: No acute abnormality. Hepatobiliary: No focal liver abnormality. No gallstones, gallbladder wall  thickening, or pericholecystic fluid. No biliary dilatation. Pancreas: No focal lesion. Normal pancreatic contour. No surrounding inflammatory changes. No main pancreatic ductal dilatation. Spleen: Normal in size without focal abnormality. Adrenals/Urinary Tract: No adrenal nodule bilaterally. No nephrolithiasis and no hydronephrosis. No definite contour-deforming renal mass. No ureterolithiasis or hydroureter. The urinary bladder is unremarkable. Stomach/Bowel: Stomach is within normal limits. No evidence of bowel wall thickening or dilatation. Appendix appears normal. Vascular/Lymphatic: No abdominal aorta or iliac aneurysm. Mild atherosclerotic plaque of the aorta and its branches. No abdominal, pelvic, or inguinal lymphadenopathy. Reproductive: Uterus and bilateral adnexa are unremarkable. Other: Peritoneal dialysis catheter with tip within the pelvis. Small-to-moderate  volume simple free intraperitoneal free fluid. No intraperitoneal free gas. No organized fluid collection. Musculoskeletal: No abdominal wall hernia or abnormality. No suspicious lytic or blastic osseous lesions. No acute displaced fracture. IMPRESSION: 1. Small-to-moderate volume simple ascites. Peritoneal dialysis catheter. 2. No acute intra-intrapelvic abnormality with limited evaluation on this noncontrast study. 3.  Aortic Atherosclerosis (ICD10-I70.0). Electronically Signed   By: Morgane  Naveau M.D.   On: 03/04/2024 22:36     Subjective: Patient seen examined bedside, sitting on edge of bed.  No complaints this morning.  Ready for discharge home.  ID and nephrology has set up intraperitoneal antibiotics with outpatient HD clinic appointment this afternoon at 2 PM.  Patient with no other [place] complaints, concerns or questions at this time.  Denies headache, no dizziness, no chest pain, no palpitations, no shortness of breath, no abdominal pain, no fever/chills/night sweats, no nausea/vomiting/diarrhea, no focal weakness, no fatigue, no paresthesia.  No acute events overnight per nursing staff.  Discharge Exam: Vitals:   03/09/24 0530 03/09/24 0803  BP: 130/76 (!) 149/86  Pulse: 78 85  Resp: 18 19  Temp: 98.1 F (36.7 C) 98.3 F (36.8 C)  SpO2: 98% 99%   Vitals:   03/08/24 1619 03/08/24 2013 03/09/24 0530 03/09/24 0803  BP: (!) 134/54 (!) 145/86 130/76 (!) 149/86  Pulse: 79 87 78 85  Resp: 18 18 18 19   Temp: 98.4 F (36.9 C) 98.3 F (36.8 C) 98.1 F (36.7 C) 98.3 F (36.8 C)  TempSrc:      SpO2: 97% 97% 98% 99%  Weight:      Height:        Physical Exam: GEN: NAD, alert and oriented x 3, obese HEENT: NCAT, PERRL, EOMI, sclera clear, MMM PULM: CTAB w/o wheezes/crackles, normal respiratory effort, on room air CV: RRR w/o M/G/R GI: abd soft, nondistended, NTTP, + BS, PD catheter noted MSK: Moves all extremities independently NEURO: No focal neurological deficit PSYCH:  normal mood/affect    The results of significant diagnostics from this hospitalization (including imaging, microbiology, ancillary and laboratory) are listed below for reference.     Microbiology: Recent Results (from the past 240 hours)  Body fluid culture w Gram Stain     Status: None   Collection Time: 03/05/24  5:45 AM   Specimen: Peritoneal Dialysate; Body Fluid  Result Value Ref Range Status   Specimen Description PERITONEAL DIALYSATE  Final   Special Requests NONE  Final   Gram Stain   Final    ABUNDANT WBC PRESENT, PREDOMINANTLY PMN NO ORGANISMS SEEN    Culture   Final    NO GROWTH 3 DAYS Performed at [facility], [address], [place], [place] [address]    Report Status 03/08/2024 FINAL  Final     Labs: BNP (last 3 results) No results for input(s): BNP in  the last 8760 hours. Basic Metabolic Panel: Recent Labs  Lab 03/05/24 0248 03/06/24 0328 03/07/24 0250 03/08/24 0906 03/09/24 0539  NA 135 130* 133* 138 136  K 4.2 3.9 3.5 4.0 4.0  CL 102 100 99 103 102  CO2 19* 22 24 23 26   GLUCOSE 229* 273* 226* 174* 179*  BUN 41* 40* 36* 30* 32*  CREATININE 6.04* 6.08* 5.75* 5.67* 6.05*  CALCIUM  7.8* 7.6* 7.9* 8.2* 8.2*  MG  --  1.6* 2.3 1.9 1.8  PHOS 5.6* 5.2* 5.7* 5.1* 5.8*   Liver Function Tests: Recent Labs  Lab 03/04/24 2053 03/05/24 0248 03/06/24 0328 03/07/24 0250 03/08/24 0906 03/09/24 0539  AST 21  --   --   --   --   --   ALT 11  --   --   --   --   --   ALKPHOS 160*  --   --   --   --   --   BILITOT 0.4  --   --   --   --   --   PROT 6.9  --   --   --   --   --   ALBUMIN 3.6 2.8* 2.2* 2.2* 2.4* 2.3*   Recent Labs  Lab 03/04/24 2053 03/05/24 0248  LIPASE 60* 39   No results for input(s): AMMONIA in the last 168 hours. CBC: Recent Labs  Lab 03/04/24 2053 03/05/24 0248 03/06/24 0328 03/07/24 0250 03/08/24 0906  WBC 7.1 9.1 5.4 4.0 3.0*  NEUTROABS 5.9  --   --   --   --   HGB 10.0* 9.4* 8.6* 9.1* 9.2*  HCT 29.5* 28.2*  25.6* 27.2* 27.7*  MCV 87.0 88.1 88.9 87.7 87.9  PLT 250 232 199 225 235   Cardiac Enzymes: No results for input(s): CKTOTAL, CKMB, CKMBINDEX, TROPONINI in the last 168 hours. BNP: Invalid input(s): POCBNP CBG: Recent Labs  Lab 03/08/24 0751 03/08/24 1125 03/08/24 1623 03/08/24 2015 03/09/24 0805  GLUCAP 166* 152* 115* 212* 195*   D-Dimer No results for input(s): DDIMER in the last 72 hours. Hgb A1c No results for input(s): HGBA1C in the last 72 hours. Lipid Profile No results for input(s): CHOL, HDL, LDLCALC, TRIG, CHOLHDL, LDLDIRECT in the last 72 hours. Thyroid  function studies No results for input(s): TSH, T4TOTAL, T3FREE, THYROIDAB in the last 72 hours.  Invalid input(s): FREET3 Anemia work up No results for input(s): VITAMINB12, FOLATE, FERRITIN, TIBC, IRON , RETICCTPCT in the last 72 hours. Urinalysis    Component Value Date/Time   COLORURINE YELLOW 02/27/2023 2343   APPEARANCEUR CLEAR 02/27/2023 2343   LABSPEC 1.011 02/27/2023 2343   PHURINE 6.5 02/27/2023 2343   GLUCOSEU 250 (A) 02/27/2023 2343   HGBUR SMALL (A) 02/27/2023 2343   BILIRUBINUR NEGATIVE 02/27/2023 2343   KETONESUR NEGATIVE 02/27/2023 2343   PROTEINUR >300 (A) 02/27/2023 2343   UROBILINOGEN 0.2 11/16/2014 0839   NITRITE NEGATIVE 02/27/2023 2343   LEUKOCYTESUR NEGATIVE 02/27/2023 2343   Sepsis Labs Recent Labs  Lab 03/05/24 0248 03/06/24 0328 03/07/24 0250 03/08/24 0906  WBC 9.1 5.4 4.0 3.0*   Microbiology Recent Results (from the past 240 hours)  Body fluid culture w Gram Stain     Status: None   Collection Time: 03/05/24  5:45 AM   Specimen: Peritoneal Dialysate; Body Fluid  Result Value Ref Range Status   Specimen Description PERITONEAL DIALYSATE  Final   Special Requests NONE  Final   Gram Stain   Final  ABUNDANT WBC PRESENT, PREDOMINANTLY PMN NO ORGANISMS SEEN    Culture   Final    NO GROWTH 3 DAYS Performed at [facility], [address], [place], [place] [address]    Report Status 03/08/2024 FINAL  Final     Time coordinating discharge: Over 30 minutes  SIGNED:   Rema Care [place], DO  [facility] 03/09/2024, 8:32 AM
